# Patient Record
Sex: Female | Born: 1935 | Race: White | Hispanic: No | Smoking: Never smoker
Health system: Southern US, Community
[De-identification: ages and names within clinical notes are randomized; demographics above are authoritative.]

## PROBLEM LIST (undated history)

## (undated) DIAGNOSIS — K5900 Constipation, unspecified: Secondary | ICD-10-CM

## (undated) DIAGNOSIS — D485 Neoplasm of uncertain behavior of skin: Secondary | ICD-10-CM

## (undated) DIAGNOSIS — F3289 Other specified depressive episodes: Secondary | ICD-10-CM

## (undated) DIAGNOSIS — R269 Unspecified abnormalities of gait and mobility: Secondary | ICD-10-CM

## (undated) DIAGNOSIS — R609 Edema, unspecified: Secondary | ICD-10-CM

## (undated) DIAGNOSIS — I4892 Unspecified atrial flutter: Secondary | ICD-10-CM

## (undated) DIAGNOSIS — M25559 Pain in unspecified hip: Secondary | ICD-10-CM

## (undated) DIAGNOSIS — Z79899 Other long term (current) drug therapy: Secondary | ICD-10-CM

## (undated) DIAGNOSIS — E876 Hypokalemia: Secondary | ICD-10-CM

## (undated) DIAGNOSIS — R110 Nausea: Secondary | ICD-10-CM

## (undated) DIAGNOSIS — R7989 Other specified abnormal findings of blood chemistry: Secondary | ICD-10-CM

## (undated) DIAGNOSIS — K219 Gastro-esophageal reflux disease without esophagitis: Secondary | ICD-10-CM

## (undated) DIAGNOSIS — E785 Hyperlipidemia, unspecified: Secondary | ICD-10-CM

## (undated) DIAGNOSIS — G8929 Other chronic pain: Secondary | ICD-10-CM

## (undated) DIAGNOSIS — R350 Frequency of micturition: Secondary | ICD-10-CM

## (undated) DIAGNOSIS — R0902 Hypoxemia: Secondary | ICD-10-CM

## (undated) DIAGNOSIS — K449 Diaphragmatic hernia without obstruction or gangrene: Secondary | ICD-10-CM

## (undated) DIAGNOSIS — M5137 Other intervertebral disc degeneration, lumbosacral region: Secondary | ICD-10-CM

## (undated) DIAGNOSIS — M1710 Unilateral primary osteoarthritis, unspecified knee: Secondary | ICD-10-CM

## (undated) DIAGNOSIS — R011 Cardiac murmur, unspecified: Secondary | ICD-10-CM

## (undated) DIAGNOSIS — I517 Cardiomegaly: Secondary | ICD-10-CM

## (undated) DIAGNOSIS — E559 Vitamin D deficiency, unspecified: Secondary | ICD-10-CM

## (undated) DIAGNOSIS — R42 Dizziness and giddiness: Secondary | ICD-10-CM

## (undated) DIAGNOSIS — L600 Ingrowing nail: Secondary | ICD-10-CM

## (undated) DIAGNOSIS — R809 Proteinuria, unspecified: Secondary | ICD-10-CM

## (undated) DIAGNOSIS — M542 Cervicalgia: Secondary | ICD-10-CM

## (undated) DIAGNOSIS — M4120 Other idiopathic scoliosis, site unspecified: Secondary | ICD-10-CM

## (undated) DIAGNOSIS — H906 Mixed conductive and sensorineural hearing loss, bilateral: Secondary | ICD-10-CM

## (undated) DIAGNOSIS — L89609 Pressure ulcer of unspecified heel, unspecified stage: Secondary | ICD-10-CM

## (undated) DIAGNOSIS — M810 Age-related osteoporosis without current pathological fracture: Secondary | ICD-10-CM

## (undated) DIAGNOSIS — I447 Left bundle-branch block, unspecified: Secondary | ICD-10-CM

## (undated) DIAGNOSIS — IMO0002 Reserved for concepts with insufficient information to code with codable children: Secondary | ICD-10-CM

## (undated) DIAGNOSIS — D649 Anemia, unspecified: Secondary | ICD-10-CM

## (undated) DIAGNOSIS — R5381 Other malaise: Secondary | ICD-10-CM

## (undated) DIAGNOSIS — I509 Heart failure, unspecified: Secondary | ICD-10-CM

## (undated) DIAGNOSIS — M5430 Sciatica, unspecified side: Secondary | ICD-10-CM

## (undated) DIAGNOSIS — M51379 Other intervertebral disc degeneration, lumbosacral region without mention of lumbar back pain or lower extremity pain: Secondary | ICD-10-CM

## (undated) DIAGNOSIS — B0229 Other postherpetic nervous system involvement: Secondary | ICD-10-CM

## (undated) DIAGNOSIS — M545 Low back pain, unspecified: Secondary | ICD-10-CM

## (undated) DIAGNOSIS — I455 Other specified heart block: Secondary | ICD-10-CM

## (undated) DIAGNOSIS — J984 Other disorders of lung: Secondary | ICD-10-CM

## (undated) DIAGNOSIS — I70209 Unspecified atherosclerosis of native arteries of extremities, unspecified extremity: Secondary | ICD-10-CM

## (undated) DIAGNOSIS — R1310 Dysphagia, unspecified: Secondary | ICD-10-CM

## (undated) DIAGNOSIS — J301 Allergic rhinitis due to pollen: Secondary | ICD-10-CM

## (undated) DIAGNOSIS — L821 Other seborrheic keratosis: Secondary | ICD-10-CM

## (undated) DIAGNOSIS — R5383 Other fatigue: Secondary | ICD-10-CM

## (undated) DIAGNOSIS — H6120 Impacted cerumen, unspecified ear: Secondary | ICD-10-CM

## (undated) DIAGNOSIS — E039 Hypothyroidism, unspecified: Secondary | ICD-10-CM

## (undated) DIAGNOSIS — R531 Weakness: Secondary | ICD-10-CM

## (undated) DIAGNOSIS — G589 Mononeuropathy, unspecified: Secondary | ICD-10-CM

## (undated) DIAGNOSIS — S72009B Fracture of unspecified part of neck of unspecified femur, initial encounter for open fracture type I or II: Secondary | ICD-10-CM

## (undated) DIAGNOSIS — I10 Essential (primary) hypertension: Secondary | ICD-10-CM

## (undated) DIAGNOSIS — G4700 Insomnia, unspecified: Secondary | ICD-10-CM

## (undated) DIAGNOSIS — F329 Major depressive disorder, single episode, unspecified: Secondary | ICD-10-CM

## (undated) DIAGNOSIS — R0602 Shortness of breath: Secondary | ICD-10-CM

## (undated) DIAGNOSIS — I4891 Unspecified atrial fibrillation: Secondary | ICD-10-CM

## (undated) HISTORY — DX: Low back pain: M54.5

## (undated) HISTORY — DX: Shortness of breath: R06.02

## (undated) HISTORY — DX: Other postherpetic nervous system involvement: B02.29

## (undated) HISTORY — DX: Cardiac murmur, unspecified: R01.1

## (undated) HISTORY — DX: Diaphragmatic hernia without obstruction or gangrene: K44.9

## (undated) HISTORY — DX: Dysphagia, unspecified: R13.10

## (undated) HISTORY — DX: Other malaise: R53.81

## (undated) HISTORY — DX: Constipation, unspecified: K59.00

## (undated) HISTORY — DX: Other specified abnormal findings of blood chemistry: R79.89

## (undated) HISTORY — DX: Other specified heart block: I45.5

## (undated) HISTORY — DX: Allergic rhinitis due to pollen: J30.1

## (undated) HISTORY — DX: Unspecified atherosclerosis of native arteries of extremities, unspecified extremity: I70.209

## (undated) HISTORY — DX: Cervicalgia: M54.2

## (undated) HISTORY — DX: Anemia, unspecified: D64.9

## (undated) HISTORY — DX: Other idiopathic scoliosis, site unspecified: M41.20

## (undated) HISTORY — DX: Reserved for concepts with insufficient information to code with codable children: IMO0002

## (undated) HISTORY — DX: Hyperlipidemia, unspecified: E78.5

## (undated) HISTORY — DX: Edema, unspecified: R60.9

## (undated) HISTORY — DX: Other disorders of lung: J98.4

## (undated) HISTORY — DX: Other seborrheic keratosis: L82.1

## (undated) HISTORY — DX: Vitamin D deficiency, unspecified: E55.9

## (undated) HISTORY — DX: Mononeuropathy, unspecified: G58.9

## (undated) HISTORY — DX: Sciatica, unspecified side: M54.30

## (undated) HISTORY — DX: Hypothyroidism, unspecified: E03.9

## (undated) HISTORY — DX: Heart failure, unspecified: I50.9

## (undated) HISTORY — DX: Nausea: R11.0

## (undated) HISTORY — DX: Unspecified atrial flutter: I48.92

## (undated) HISTORY — DX: Frequency of micturition: R35.0

## (undated) HISTORY — DX: Dizziness and giddiness: R42

## (undated) HISTORY — DX: Other long term (current) drug therapy: Z79.899

## (undated) HISTORY — DX: Left bundle-branch block, unspecified: I44.7

## (undated) HISTORY — DX: Fracture of unspecified part of neck of unspecified femur, initial encounter for open fracture type I or II: S72.009B

## (undated) HISTORY — DX: Age-related osteoporosis without current pathological fracture: M81.0

## (undated) HISTORY — DX: Hypokalemia: E87.6

## (undated) HISTORY — DX: Pain in unspecified hip: M25.559

## (undated) HISTORY — DX: Insomnia, unspecified: G47.00

## (undated) HISTORY — DX: Hypoxemia: R09.02

## (undated) HISTORY — DX: Impacted cerumen, unspecified ear: H61.20

## (undated) HISTORY — PX: OTHER SURGICAL HISTORY: SHX169

## (undated) HISTORY — DX: Unilateral primary osteoarthritis, unspecified knee: M17.10

## (undated) HISTORY — DX: Other chronic pain: G89.29

## (undated) HISTORY — DX: Unspecified abnormalities of gait and mobility: R26.9

## (undated) HISTORY — DX: Other fatigue: R53.83

## (undated) HISTORY — DX: Major depressive disorder, single episode, unspecified: F32.9

## (undated) HISTORY — PX: AORTIC VALVE REPLACEMENT: SHX41

## (undated) HISTORY — DX: Other specified depressive episodes: F32.89

## (undated) HISTORY — DX: Neoplasm of uncertain behavior of skin: D48.5

## (undated) HISTORY — DX: Pressure ulcer of unspecified heel, unspecified stage: L89.609

## (undated) HISTORY — DX: Weakness: R53.1

## (undated) HISTORY — DX: Mixed conductive and sensorineural hearing loss, bilateral: H90.6

## (undated) HISTORY — DX: Proteinuria, unspecified: R80.9

## (undated) HISTORY — DX: Unspecified atrial fibrillation: I48.91

## (undated) HISTORY — DX: Cardiomegaly: I51.7

## (undated) HISTORY — DX: Gastro-esophageal reflux disease without esophagitis: K21.9

## (undated) HISTORY — DX: Low back pain, unspecified: M54.50

## (undated) HISTORY — DX: Essential (primary) hypertension: I10

## (undated) HISTORY — DX: Ingrowing nail: L60.0

---

## 1957-12-31 HISTORY — PX: DILATION AND CURETTAGE OF UTERUS: SHX78

## 1960-01-01 HISTORY — PX: PARTIAL HYSTERECTOMY: SHX80

## 1960-01-01 HISTORY — PX: APPENDECTOMY: SHX54

## 1990-08-19 HISTORY — PX: OTHER SURGICAL HISTORY: SHX169

## 1998-11-15 ENCOUNTER — Other Ambulatory Visit: Admission: RE | Admit: 1998-11-15 | Discharge: 1998-11-15 | Payer: Self-pay | Admitting: Internal Medicine

## 1999-10-01 HISTORY — PX: OTHER SURGICAL HISTORY: SHX169

## 1999-10-09 ENCOUNTER — Inpatient Hospital Stay (HOSPITAL_COMMUNITY): Admission: RE | Admit: 1999-10-09 | Discharge: 1999-10-19 | Payer: Self-pay | Admitting: Cardiology

## 1999-10-10 ENCOUNTER — Encounter: Payer: Self-pay | Admitting: Thoracic Surgery (Cardiothoracic Vascular Surgery)

## 1999-10-11 ENCOUNTER — Encounter: Payer: Self-pay | Admitting: Thoracic Surgery (Cardiothoracic Vascular Surgery)

## 1999-10-12 ENCOUNTER — Encounter: Payer: Self-pay | Admitting: Thoracic Surgery (Cardiothoracic Vascular Surgery)

## 1999-10-13 ENCOUNTER — Encounter: Payer: Self-pay | Admitting: Thoracic Surgery (Cardiothoracic Vascular Surgery)

## 1999-10-16 ENCOUNTER — Encounter: Payer: Self-pay | Admitting: Thoracic Surgery (Cardiothoracic Vascular Surgery)

## 2001-06-10 ENCOUNTER — Other Ambulatory Visit: Admission: RE | Admit: 2001-06-10 | Discharge: 2001-06-10 | Payer: Self-pay | Admitting: Internal Medicine

## 2002-09-18 ENCOUNTER — Encounter: Admission: RE | Admit: 2002-09-18 | Discharge: 2002-09-18 | Payer: Self-pay | Admitting: Specialist

## 2002-09-18 ENCOUNTER — Encounter: Payer: Self-pay | Admitting: Specialist

## 2002-09-22 ENCOUNTER — Ambulatory Visit (HOSPITAL_BASED_OUTPATIENT_CLINIC_OR_DEPARTMENT_OTHER): Admission: RE | Admit: 2002-09-22 | Discharge: 2002-09-22 | Payer: Self-pay | Admitting: Specialist

## 2004-04-05 HISTORY — PX: COLONOSCOPY: SHX174

## 2008-09-19 ENCOUNTER — Inpatient Hospital Stay (HOSPITAL_COMMUNITY): Admission: EM | Admit: 2008-09-19 | Discharge: 2008-10-04 | Payer: Self-pay | Admitting: Emergency Medicine

## 2008-10-01 HISTORY — PX: MYELOGRAM: SHX5347

## 2008-10-03 HISTORY — PX: ESOPHAGOGASTRODUODENOSCOPY: SHX5428

## 2010-12-15 ENCOUNTER — Emergency Department (HOSPITAL_BASED_OUTPATIENT_CLINIC_OR_DEPARTMENT_OTHER)
Admission: EM | Admit: 2010-12-15 | Discharge: 2010-12-16 | Payer: Self-pay | Source: Home / Self Care | Admitting: Emergency Medicine

## 2010-12-16 ENCOUNTER — Emergency Department (HOSPITAL_COMMUNITY)
Admission: RE | Admit: 2010-12-16 | Discharge: 2010-12-16 | Payer: Self-pay | Source: Home / Self Care | Attending: Emergency Medicine | Admitting: Emergency Medicine

## 2010-12-16 ENCOUNTER — Encounter (INDEPENDENT_AMBULATORY_CARE_PROVIDER_SITE_OTHER): Payer: Self-pay | Admitting: Emergency Medicine

## 2010-12-16 DIAGNOSIS — R0602 Shortness of breath: Secondary | ICD-10-CM

## 2010-12-16 HISTORY — DX: Shortness of breath: R06.02

## 2011-01-18 ENCOUNTER — Ambulatory Visit: Payer: Self-pay | Admitting: Cardiovascular Disease

## 2011-01-21 ENCOUNTER — Encounter: Payer: Self-pay | Admitting: Internal Medicine

## 2011-03-12 LAB — BASIC METABOLIC PANEL
BUN: 15 mg/dL (ref 6–23)
BUN: 21 mg/dL (ref 6–23)
CO2: 24 mEq/L (ref 19–32)
Calcium: 9.3 mg/dL (ref 8.4–10.5)
Chloride: 105 mEq/L (ref 96–112)
Chloride: 106 mEq/L (ref 96–112)
GFR calc Af Amer: >60 mL/min (ref >60)
GFR calc non Af Amer: 52 mL/min — ABNORMAL LOW (ref >60)
Potassium: 3.7 mEq/L (ref 3.5–5.1)
Potassium: 3.8 mEq/L (ref 3.5–5.1)
Sodium: 139 mEq/L (ref 135–145)
Sodium: 144 mEq/L (ref 135–145)

## 2011-03-12 LAB — CBC
HCT: 34.8 % — ABNORMAL LOW (ref 36.0–46.0)
Hemoglobin: 10.5 g/dL — ABNORMAL LOW (ref 12.0–15.0)
Hemoglobin: 11.5 g/dL — ABNORMAL LOW (ref 12.0–15.0)
MCH: 29.4 pg (ref 26.0–34.0)
MCHC: 32.4 g/dL (ref 30.0–36.0)
MCHC: 33 g/dL (ref 30.0–36.0)
MCV: 90.8 fL (ref 78.0–100.0)
MCV: 89 fL (ref 78.0–100.0)
Platelets: 161 10*3/uL (ref 150–400)
Platelets: 181 10*3/uL (ref 150–400)
RBC: 3.91 MIL/uL (ref 3.87–5.11)
RDW: 13 % (ref 11.5–15.5)
RDW: 12.7 % (ref 11.5–15.5)
WBC: 9.8 K/uL (ref 4.0–10.5)

## 2011-03-12 LAB — BASIC METABOLIC PANEL WITH GFR
Calcium: 10.1 mg/dL (ref 8.4–10.5)
Creatinine, Ser: 1 mg/dL (ref 0.4–1.2)
GFR calc Af Amer: >60 mL/min (ref >60)
Glucose, Bld: 115 mg/dL — ABNORMAL HIGH (ref 70–99)

## 2011-03-12 LAB — POCT CARDIAC MARKERS
CKMB, poc: 2.1 ng/mL (ref 1.0–8.0)
CKMB, poc: 1.5 ng/mL (ref 1.0–8.0)
Myoglobin, poc: 146 ng/mL (ref 12–200)
Myoglobin, poc: 52.5 ng/mL (ref 12–200)
Troponin i, poc: <0.05 ng/mL (ref 0.00–0.09)

## 2011-03-12 LAB — TROPONIN I: Troponin I: 0.03 ng/mL (ref 0.00–0.06)

## 2011-03-12 LAB — DIFFERENTIAL
Eosinophils Absolute: 0 10*3/uL (ref 0.0–0.7)
Lymphocytes Relative: 19 % (ref 12–46)
Neutro Abs: 6.1 10*3/uL (ref 1.7–7.7)
Neutrophils Relative %: 74 % (ref 43–77)

## 2011-03-12 LAB — PROTIME-INR
INR: 1.02 (ref 0.00–1.49)
Prothrombin Time: 13.6 seconds (ref 11.6–15.2)

## 2011-03-12 LAB — CK TOTAL AND CKMB (NOT AT ARMC): Relative Index: INVALID (ref 0.0–2.5)

## 2011-03-12 LAB — APTT: aPTT: 32 s (ref 24–37)

## 2011-03-12 LAB — D-DIMER, QUANTITATIVE: D-Dimer, Quant: 0.6 ug/mL-FEU — ABNORMAL HIGH (ref 0.00–0.48)

## 2011-05-15 NOTE — Consult Note (Signed)
NAME:  Vicki Velasquez, Vicki Velasquez NO.:  [id number]   MEDICAL RECORD NO.:  [id number]          PATIENT TYPE:  INP   LOCATION:                               FACILITY:  [facility]   PHYSICIAN:  Graylin Shiver, M.D.   DATE OF BIRTH:  Sep 08, 1916   DATE OF CONSULTATION:  09/24/2008  DATE OF DISCHARGE:                                 CONSULTATION   We were asked to see Vicki Velasquez in consultation today by Dr. Hillery Aldo of [facility].   HISTORY OF PRESENT ILLNESS:  This is an 77 year old female with a  history of hypertension, diabetes mellitus, and GERD, who was admitted  on September 19, 2008, with right lower lobe pneumonia.  She was also  found to have gallstones.  She has a history of hypertension and chronic  narcotic use for sciatica.  Since she has been admitted, she has  developed shingles as well as acute neck pain and MRIs pending for her  neck pain.  The patient has had some progressive dysphagia to pills and  solids.  Dr. Chilton Si asked if [facility] would work up for dysphagia during  this admission.  Barium swallow done today was positive for stricture as  well as gastric mucosal thickening in the fundus and body.   Past medical history is provided by her son and was significant for  chronic sciatica particularly affecting her left lower extremity,  hypertension, osteoporosis, hypothyroidism.  She had a history of a  benign brain tumor that was removed.  She also has a history of porcine  aortic valve replacement.  She is not on Coumadin for this.   She has had a hysterectomy, appendectomy, and a coronary artery bypass  graft.   Her PCP is Dr. Murray Hodgkins.   Current medications are per chart, but include an 81 mg aspirin and  Prilosec.   She has an allergy to CODEINE.   Review of systems as per HPI.   Social history is negative for alcohol, tobacco, and IV drug use.   Family history is noncontributory.   She did tell me that she had a colonoscopy done  approximately 5 years  ago by Dr. Sharrell Ku and she was told she would not need another  one.   On physical exam, she is awake.  She has an ice pack applied to her  neck.  She has hearing loss.  Temperature is 98.0, pulse 79,  respirations 18, blood pressure is 164/70.  Her heart has a regular rate  and rhythm.  Her abdomen is soft, nontender, and nondistended with good  bowel sounds.  She appears debilitated and feeble.   LABORATORY DATA:  Her BMET is within normal limits.  Her CBC is  significant for a hemoglobin of 9.8 and a hematocrit of 29.2.  LFTs are  within normal limits.  They were taken on September 19, 2008.   Radiological exams include a barium swallow that was done today.  She  has a 13-mm tablet that did not pass through the GE junction.  She had a  patulous esophagus  with possible stricture of the GE junction.   She had diverticulum in the region of the GE junction.   She had persistent mucosal thickening along the gastric fundus and body.   ASSESSMENT:  Dr. Herbert Moors has seen and examined the patient, reviewed  the chart, and collected a history.  His impression is this is an 68-  year-old female with multiple medical problems including a  gastroesophageal stricture and gastric mucosal thickening, needs an  esophagogastroduodenoscopy with this admission with possible dilatation  and with biopsy; however, given her acute neck problems, pneumonia, and  shingles, this can wait a few days until her health improves.  We will  check her back early next week.  Prior to procedure, the patient will  need to receive prophylactic antibiotics for her porcine valve and her  Lovenox will need to be stopped.   Thank you very much for this consultation.      Stephani Police, PA    ______________________________  Graylin Shiver, M.D.    MLY/MEDQ  D:  09/24/2008  T:  09/25/2008  Job:  [id number]   cc:   Graylin Shiver, M.D.  Lenon Curt Chilton Si, M.D.

## 2011-05-15 NOTE — Discharge Summary (Signed)
NAME:  Vicki Velasquez, Vicki Velasquez NO.:  [id number]   MEDICAL RECORD NO.:  [id number]          PATIENT TYPE:  INP   LOCATION:  5035                         FACILITY:  [facility]   PHYSICIAN:  Lonia Blood, M.D.DATE OF BIRTH:  Mar 09, 1916   DATE OF ADMISSION:  09/19/2008  DATE OF DISCHARGE:  10/04/2008                               DISCHARGE SUMMARY   PRIMARY CARE PHYSICIAN:  [name], M.D.   Please note this is an addendum to a previously dictated interim summary  per Dr. Hillery Aldo on September 24, 2008, labeled job number [id number].  For full details concerning hospital stay, please refer to that thorough  dictated progress note.   DISCHARGE DIAGNOSES:  1. Transient dysphagia secondary to esophageal stricture.      a.     Initially identified during early hospital stay with       esophagram suggesting esophageal stricture and thickened gastric       mucosa.      b.     EGD delayed secondary to concern for cervical spine       degenerative disease.      c.     Cleared for EGD via myelogram.      d.     EGD carried out successfully with evidence of no esophageal       stricture or mucosal abnormalities whatsoever.      e.     Tolerating regular diet.  2. Right middle lobe pneumonia - completed antibiotic therapy.  3. Hypertension.  4. Hypothyroidism.  5. Osteoporosis.  6. Herpes zoster outbreak status post Zovirax and Lyrica therapy.  7. Porcine aortic valve replacement.  8. Chronic right hip pain.  9. 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  10.Left renal cyst.  11.Cholelithiasis.  12.Chronic thrombocytopenia.  13.Normocytic anemia.   DISCHARGE MEDICATIONS:  1. Oxycodone 5 mg q.6h. as needed.  2. Promethazine 12.5 mg q.6h. p.r.n.  3. Lasix 40 mg daily p.r.n. edema.  4. Toprol XL 50 mg daily.  5. Hyzaar 100/25 p.o. daily.  6. Levoxyl 75 mg p.o. daily.  7. Aspirin 81 mg p.o.  daily.  8. Centrum one tablet p.o. daily.  9. Prilosec OTC 20 mg p.o. daily.  10.Evista one tablet p.o. daily.  11.Senokot 1 tablet p.o. q.h.s.   FOLLOW UP:  The patient is advised to follow up with Dr. Frederik Pear  within approximately 7 days for routine medical recheck.   ADDITIONAL PROCEDURES:  1. Cervical myelogram revealing evidence of multilevel degenerative      joint disease but no evidence whatsoever of significant cord      compression.  2. EGD per Dr. Leary Roca.  Questionable tiny hiatal hernia with some      C loop narrowing of the duodenum but otherwise normal EGD without      any stomach abnormalities and no indication for dilation.   ADDENDUM TO HOSPITAL COURSE:  Vicki Velasquez hospital stay was extended  beyond the previously dictated summary due to concern for the  possibility of an esophageal stricture leading to dysphagia.  The  patient was not immediately cleared for EGD because of a concern for  severe degenerative spine disease of the cervical spine and the fear of  the possibility of cord compression with manipulation of the neck for  this procedure.  As a result, a CT myelogram was carried out by  radiology on October 01, 2008.  This revealed no evidence whatsoever of  spinal cord compression.  The patient was therefore cleared for EGD.  EGD was successfully carried out on October 03, 2008 and revealed no  evidence of esophageal stricture and no abnormalities of the gastric  mucosa.  The patient was returned to her room and was able to tolerate a  regular diet from that point on.  Physical therapy and occupational  therapy were asked to again assess the patient.  They felt the patient  would benefit from home health occupational therapy, as well as 24-hour  supervision/assistance.  The patient was able to confirm that her family  was able to provide this.  Home health occupational therapy orders were  placed, and the patient was then cleared for discharge with  stable vital  signs in an afebrile state.      Lonia Blood, M.D.  Electronically Signed     JTM/MEDQ  D:  10/04/2008  T:  10/04/2008  Job:  [id number]

## 2011-05-15 NOTE — H&P (Signed)
NAME:  Vicki Velasquez, Vicki Velasquez              ACCOUNT NO.:  [id number]   MEDICAL RECORD NO.:  [id number]          PATIENT TYPE:  INP   LOCATION:  5035                         FACILITY:  [facility]   PHYSICIAN:  Lonia Blood, M.D.      DATE OF BIRTH:  12/01/1916   DATE OF ADMISSION:  09/19/2008  DATE OF DISCHARGE:                              HISTORY & PHYSICAL   PRIMARY CARE PHYSICIAN:  [name], MD   CARDIOLOGIST:  Antionette Char, MD   PRESENTING COMPLAINT:  Right upper quadrant pain.   HISTORY OF PRESENT ILLNESS:  The patient is an 77 year old female with  known hypertension, diabetes and GERD who presented with right upper  quadrant abdominal pain that is sharp, intermittent, lasting a few  seconds to a few minutes.  Pain has been going on in the past 24 hours.  No relieved by anything and no aggravating factors except deep breath.  The patient took some pain meds at home that is not helping her pain so  she came to the emergency room.  Denied any fever or chills.  Denied any  cough.  Denied any nausea and vomiting.  She denied any diarrhea.   PAST MEDICAL HISTORY:  Significant for  1. Chronic pain with some sciatica especially affecting the left lower      extremity.  2. GERD.  3. Hypertension.  4. Hypothyroidism.  5. History of brain tumor status post valve replacement.  6. Osteoporosis.   ALLERGIES:  She is allergic to CODEINE and BIAXIN.   MEDICATIONS:  1. OxyContin 5 mg tablet now switched to hydrocodone and      acetaminophen.  2. Phenergan 25 mg every 6 hours p.r.n. for nausea.  3. Lasix 40 mg daily.  4. Toprol-XL 50 mg daily.  5. Hyzaar 125 1 tablet daily.  6. Levoxyl 75 mcg daily.  7. Aspirin 81 mg daily.  8. Centrum 1 tablet daily.  9. Prilosec 20 mg.  10.Evista 60 mg daily.   SOCIAL HISTORY:  The patient lives in [place], [place].  She lives  alone.  No alcohol, tobacco or IV drug use.  She is widowed.   FAMILY HISTORY:  Noncontributory.   REVIEW OF  SYSTEMS:  Mainly pain in her lower extremity from her  sciatica.   PHYSICAL EXAMINATION:  VITAL SIGNS:  Temperature 97.3, blood pressure  200/70, pulse 63, respiratory rate 18, sats 99% on room air.  GENERAL:  She is awake, alert, oriented in no acute distress.  HEENT:  PERRL.  EOMI.  NECK:  Supple.  No JVD, no lymphadenopathy.  RESPIRATORY:  The patient has good air entry bilaterally.  No wheezes or  rales.  CARDIOVASCULAR:  She has S1-S2, no murmur.  ABDOMEN:  Soft, nontender with positive bowel sounds.  EXTREMITIES:  No edema, cyanosis or clubbing.   LABORATORY DATA:  White count is 6.7, hemoglobin 11.6, platelet count  164.  Lipase 21.  Sodium 137, potassium 3.3, chloride 101, CO2 24,  glucose 108, BUN 16, creatinine 0.93, calcium 9.4, total protein 6.3,  albumin 3.7.  LFTs are normal.  Urinalysis shows  small leukocyte  esterase, WBC is 0-12, few bacteria.  Chest x-ray showed medial and  right lung base airspace disease with air bronchograms most consistent  with infection, atypical pulmonary edema could also present the same.  Abdominal ultrasound showed left renal cyst, some cholelithiasis without  evidence of cholecystitis.   ASSESSMENT:  This is an 77 year old female presenting with right upper  quadrant pain and with a right medial lower lung infiltrate, probably  early pneumonia.  Her pain is likely pleuritic in nature due to the  position of her infiltrate that is being reflected in her right upper  quadrant.  She has some gallstones but there is no evidence of  cholecystitis.   PLAN:  1. Early right lower lobe pneumonia.  No evidence of aspiration.  We      will treat her as if this is community-acquired pneumonia and start      her on Rocephin and Zithromax IV.  She is not febrile and has no      white count, so she should probably be okay on this regimen.      Continue with some pain control.  On oxygen.  2. Hypothyroidism.  I will check TSH and continue with her       Levothyroxine.  3. Hypertension.  Her blood pressure is very much elevated.  We will      start with her home medications and titrate them as necessary.  4.      Sciatica.  Again, the patient is on chronic narcotics.  We will be      very careful with it and treat her accordingly.  4. Cholelithiasis.  The patient has chronic nausea and has been taking      Phenergan.  This may be related to cholelithiasis.  She may require      some elective cholecystectomy at some point.  5. Hypokalemia.  We will replete her potassium as soon as possible.      Lonia Blood, M.D.  Electronically Signed     LG/MEDQ  D:  09/19/2008  T:  09/20/2008  Job:  [id number]

## 2011-05-15 NOTE — Op Note (Signed)
NAME:  Vicki Velasquez, Vicki Velasquez              ACCOUNT NO.:  [id number]   MEDICAL RECORD NO.:  [id number]          PATIENT TYPE:  INP   LOCATION:  5035                         FACILITY:  [facility]   PHYSICIAN:  Petra Kuba, M.D.    DATE OF BIRTH:  [birth year]   DATE OF PROCEDURE:  10/03/2008  DATE OF DISCHARGE:                               OPERATIVE REPORT   PROCEDURE:  Esophagogastroduodenoscopy.   INDICATION:  The patient with some dysphagia, which actually has  seemingly resolved.  Abnormal barium swallow in upper GI, worrisome for  lesion in the stomach as well as in the esophagus.  Consent was signed  after risks, benefits, methods, and options thoroughly discussed  multiple times in the past and this week on followup.   MEDICATIONS:  Use fentanyl 40 mcg, Versed 3.5 mg.   PROCEDURE IN DETAIL:  The video endoscope was inserted by direct vision.  The proximal and mid esophagus was normal and distal esophagus with a  probable tiny hiatal hernia with a widely patent thin fibrous ring.  The  scope passed easily through it.  The scope passed into the stomach,  advanced through normal antrum, normal pylorus, and through a normal  duodenal bulb.  She did have some narrowing of her C-loop, but we were  able to advance around the C-loop into a normal second portion of the  duodenum, a normal-appearing ampulla was seen.  The scope was slowly  withdrawn back to the bulb.  No abnormalities were seen.  The scope was  withdrawn back to the stomach and retroflexed.  Cardia, fundus,  angularis, lesser and greater curve were evaluated on retroflex and then  straight visualization.  No abnormalities were seen.  There was nothing  to biopsy and nothing worrisome about her stomach.  No resections.  The  scope was slowly withdrawn.  Again, a good look at the distal esophagus  confirmed the above findings.  We looked at the GE junction for a while  and based on her age and other medical problems, lack of  significant  findings, elected not to balloon dilate at this time.  The scope was  withdrawn.  The esophagus was normal.  A quick look at the vocal cords  were normal, scope was withdrawn.  The patient tolerated the procedure  well.  There was no obvious immediate complication.   ENDOSCOPIC DIAGNOSES:  1. Questionable tiny hiatal hernia with a questionable, widely patent      thin fibrous ring.  2. Some C-loop narrowing of duodenum.  3. Otherwise normal esophagogastroduodenoscopy without any stomach      abnormalities seen.  Held dilation due to eating well this week and      no significant narrowing seen.   PLAN:  I have to see back p.r.n. and dilate if esophageal dysphagia  returns.  No further GI workup at this time.  I would be happy to see  back p.r.n.  Please call if I can be of any further assistance.           ______________________________  Petra Kuba, M.D.    MEM/MEDQ  D:  10/03/2008  T:  10/03/2008  Job:  [id number]   cc:   [name], M.D.

## 2011-05-15 NOTE — Group Therapy Note (Signed)
NAME:  Vicki Velasquez, Vicki Velasquez              ACCOUNT NO.:  [id number]   MEDICAL RECORD NO.:  [id number]          PATIENT TYPE:  INP   LOCATION:  5035                         FACILITY:  [facility]   PHYSICIAN:  Hillery Aldo, M.D.   DATE OF BIRTH:  Apr 11, 1916                                 PROGRESS NOTE   DATE OF DISCHARGE:  Pending.   PRIMARY CARE PHYSICIAN:  [name], M.D.   CARDIOLOGIST:  Antionette Char, MD.   GASTROENTEROLOGIST:  Griffith Citron, M.D.   DISCHARGE DIAGNOSES:  1. Right middle lobe pneumonia.  2. Dysphagia secondary to esophageal stricture.  3. Abnormal mucosal thickening in the stomach, workup ongoing.  4. Hypertension.  5. Hypothyroidism.  6. Osteoporosis.  7. Herpes zoster outbreak.  8. History of aortic valve replacement.  9. Right hip pain.  10.Cervicalgia, workup ongoing.  11.Left renal cyst.  12.Cholelithiasis.  13.Thrombocytopenia.  14.Normocytic anemia.   DISCHARGE MEDICATIONS:  Dictated at time of discharge.   CONSULTATIONS:  Graylin Shiver, M.D. of gastroenterology.   HISTORY OF PRESENT ILLNESS:  The patient is an 77 year old female who presented to the hospital with  a chief complaint of right upper quadrant pain.  Upon initial evaluation  in the emergency department, she was found to have evidence of a right  middle lobe pneumonia and therefore was admitted for further evaluation  and workup.  For full the details, please see the dictated report done  by Dr. [name].   PROCEDURES/DIAGNOSTICS STUDIES:  1. Chest x-ray on September 19, 2008 showed medial right lung base      airspace disease with air bronchograms consistent with infection.      Atypical pulmonary edema was also in the differential  2. Abdominal ultrasound on September 19, 2008 showed a left renal cyst      and cholelithiasis without evidence of cholecystitis.  3. Right hip films on September 20, 2008 showed no acute bony      abnormality.  Degenerative disk disease of the  lower lumbar spine.      Possible myositis ossificans in the soft tissue adjacent to the      greater trochanter on the left.  4. Esophagram on September 24, 2008 showed a patulous esophagus with a      potential stricture at the gastroesophageal junction.  A 13 mm      barium tablet failed to clear the GE junction.  This may be in part      due to the position.  However, consideration of a stricture from      reflux esophagitis could not be excluded.  There was diverticulum      in the region of the GE junction.  There is persistent mucosal      thickening along the gastric fundus and body.  Differential      includes gastritis, lymphoma and gastric carcinoma.      Recommendations were to perform an upper endoscopy for further      evaluation.   LABORATORY DATA:  Discharge laboratory values will be dictated at the time of discharge.   HOSPITAL COURSE:  1.  Right middle lobe pneumonia:  The patient was admitted and put on      empiric Rocephin and azithromycin.  Sputum cultures were attempted.      However, the sample sent was not representative of lower airway      secretions.  Blood cultures were negative.  She is improving      clinically on empiric treatment and has completed approximately 5      days of therapy.  We will treat her for a full 7 days with IV      antibiotics.  She has been afebrile with a normal white blood cell      count.  2. Dysphagia:  When the patient had significant difficulty swallowing      pills prompting evaluation with an esophagram.  Esophagram did show      abnormalities and this prompted a GI consultation with Dr. [name].      At this time, consultation is pending.  3. Hypertension:  The patient's blood pressure is reasonably well      controlled on her outpatient regimen.  Initially, her Lasix was      held and as her blood pressure became suboptimally controlled, this      was resumed.  She was intermittently hypotensive early in her      hospital  stay.  4. Hypothyroidism:  The patient's TSH, was checked and found to be      within normal limits indicating she is appropriately replaced.  5. Osteoporosis, degenerative disk disease, severe cervicalgia:  The      patient began to complain of severe neck pain 4 days into her      hospital stay.  Skin on her neck was erythematous where she had      been using hot packs, but no evidence of a blistering eruption was      noted.  She has known disk disease in the lower spine.  MRI scans      are pending at time of this dictation, but she is at high risk for      compression fractures, degenerative disk disease with spinal      stenosis and other processes.  Therefore, an MRI was felt to be the      best diagnostic study to rule out these various potential      etiologies.  6. Herpes zoster:  The patient has an active herpes zoster outbreak on      her right upper thigh and lateral buttocks.  She was put on Valtrex      and will complete a 7 day course of treatment with Valtrex.      Because of the lancinating pain in her neck and the inability to      rule out a zoster outbreak in that area, she was also put on Lyrica      when IV Dilaudid and p.o. Valium and Flexeril failed to      significantly relieve her symptoms.  The dermatomal distribution on      the leg follows L2 and it would be unusual for her to have a second      outbreak at a different dermatome.  Nevertheless, we cannot      completely rule it out and so Lyrica has been started.  7. Status post aortic valve replacement:  This is apparently a porcine      valve and she does not require therapeutic anticoagulation.  8. Right hip pain:  The patient  did get plain films done which show      some degenerative changes.  She has been treated conservatively.  9. Thrombocytopenia:  The patient has a mild thrombocytopenia along      with a normocytic anemia which is likely due to her advanced age      and bone marrow suppression.    DISPOSITION:  The patient's disposition is not yet set as she still has some active  issues to evaluate.  When she is stable for discharge, discharge summary  addendum will be dictated.      Hillery Aldo, M.D.  Electronically Signed     CR/MEDQ  D:  09/24/2008  T:  09/25/2008  Job:  [id number]   cc:   [name], M.D.  Fax: [phone]   Antionette Char, MD  Fax: [phone]   Griffith Citron, M.D.  Fax: [phone]

## 2011-05-18 NOTE — Op Note (Signed)
   NAME:  [name], Vicki Velasquez                        ACCOUNT NO.:  [id number]   MEDICAL RECORD NO.:  [id number]                   PATIENT TYPE:  AMB   LOCATION:  DSC                                  FACILITY:  [facility]   PHYSICIAN:  Philips J. Montez Morita, M.D.             DATE OF BIRTH:  12-04-1916   DATE OF PROCEDURE:  09/22/2002  DATE OF DISCHARGE:                                 OPERATIVE REPORT   PREOPERATIVE DIAGNOSES:  Torn medial meniscus.   POSTOPERATIVE DIAGNOSES:  Torn medial meniscus.   OPERATION PERFORMED:  Arthroscopic partial medial meniscectomy.   SURGEON:  Philips J. Montez Morita, M.D.   ANESTHESIA:  General.   DESCRIPTION OF PROCEDURE:  After suitable general anesthesia, the leg was  exsanguinated and a left thigh tourniquet was inflated to 350 mmHg.  We then  placed him in the arthroscopy leg holder __________. Lateral parapatellar in-  flow portal was created and then an inferolateral port for the scope,  anteromedial portal under direct visualization.  Pertinent pathology is  confined to the medial joint in the area of the medial tear of the medial  meniscus down to the posterior one-third.  This was trimmed with a  combination of small straight punches and __________meniscus.  Lateral joint  looked quite good.  Patellofemoral revealed some degenerative change.  There  are some grade osteoarthritic changes on the medial femoral condyle.  Scope  was changed to the medial side   DICTATION ENDED HERE                                                 Philips J. Montez Morita, M.D.    PJC/MEDQ  D:  09/22/2002  T:  09/22/2002  Job:  [id number]

## 2011-09-22 ENCOUNTER — Encounter: Payer: Self-pay | Admitting: Cardiovascular Disease

## 2011-10-01 LAB — URINALYSIS, ROUTINE W REFLEX MICROSCOPIC
Bilirubin Urine: NEGATIVE
Glucose, UA: NEGATIVE
Glucose, UA: NEGATIVE
Hgb urine dipstick: NEGATIVE
Hgb urine dipstick: NEGATIVE
Ketones, ur: NEGATIVE
Nitrite: NEGATIVE
Protein, ur: NEGATIVE
Specific Gravity, Urine: 1.009
Specific Gravity, Urine: 1.023
Urobilinogen, UA: 0.2
Urobilinogen, UA: 0.2
pH: 6

## 2011-10-01 LAB — BASIC METABOLIC PANEL
BUN: 16
BUN: 6
BUN: 18
BUN: 23
CO2: 23
CO2: 25
CO2: 29
CO2: 26
CO2: 27
Calcium: 8.7
Calcium: 8.7
Calcium: 9
Calcium: 9.1
Calcium: 9.3
Calcium: 9.3
Calcium: 9.6
Chloride: 101
Chloride: 103
Chloride: 104
Chloride: 103
Creatinine, Ser: 0.77
Creatinine, Ser: 0.8
Creatinine, Ser: 0.87
Creatinine, Ser: 0.89
Creatinine, Ser: 0.92
Creatinine, Ser: 0.92
Creatinine, Ser: 0.91
Creatinine, Ser: 1.05
GFR calc Af Amer: >60
GFR calc Af Amer: >60
GFR calc Af Amer: >60
GFR calc Af Amer: >60
GFR calc Af Amer: >60
GFR calc Af Amer: 57 — ABNORMAL LOW
GFR calc non Af Amer: >60
GFR calc non Af Amer: >60
GFR calc non Af Amer: 49 — ABNORMAL LOW
GFR calc non Af Amer: 58 — ABNORMAL LOW
Glucose, Bld: 92
Glucose, Bld: 80
Glucose, Bld: 85
Glucose, Bld: 90
Potassium: 3.5
Potassium: 3.6
Potassium: 3.9
Sodium: 132 — ABNORMAL LOW
Sodium: 139
Sodium: 139

## 2011-10-01 LAB — DIFFERENTIAL
Basophils Absolute: 0.1
Basophils Relative: 2 — ABNORMAL HIGH
Eosinophils Absolute: 0.2
Eosinophils Relative: 3
Lymphocytes Relative: 26
Lymphs Abs: 1.8
Monocytes Absolute: 0.7
Monocytes Relative: 11
Neutro Abs: 3.9
Neutrophils Relative %: 58

## 2011-10-01 LAB — COMPREHENSIVE METABOLIC PANEL WITH GFR
Albumin: 3.7
Alkaline Phosphatase: 91
BUN: 16
Chloride: 101
Creatinine, Ser: 0.93
Glucose, Bld: 108 — ABNORMAL HIGH
Potassium: 3.3 — ABNORMAL LOW
Total Bilirubin: 0.6

## 2011-10-01 LAB — URINE MICROSCOPIC-ADD ON

## 2011-10-01 LAB — CK TOTAL AND CKMB (NOT AT ARMC)
CK, MB: 2
Relative Index: INVALID
Total CK: 46
Total CK: 60

## 2011-10-01 LAB — CBC
HCT: 29.2 — ABNORMAL LOW
HCT: 29.3 — ABNORMAL LOW
HCT: 30.5 — ABNORMAL LOW
HCT: 34.1 — ABNORMAL LOW
HCT: 28.8 — ABNORMAL LOW
HCT: 32.6 — ABNORMAL LOW
Hemoglobin: 11.6 — ABNORMAL LOW
Hemoglobin: 9.3 — ABNORMAL LOW
Hemoglobin: 9.7 — ABNORMAL LOW
Hemoglobin: 10.9 — ABNORMAL LOW
MCHC: 33.7
MCHC: 33.8
MCHC: 34
MCHC: 35.2
MCHC: 33.6
MCV: 89.2
MCV: 89.4
MCV: 89.7
MCV: 90.2
Platelets: 122 — ABNORMAL LOW
Platelets: 164
Platelets: 174
Platelets: 276
RBC: 3.01 — ABNORMAL LOW
RBC: 3.1 — ABNORMAL LOW
RBC: 3.21 — ABNORMAL LOW
RBC: 3.27 — ABNORMAL LOW
RBC: 3.27 — ABNORMAL LOW
RBC: 3.82 — ABNORMAL LOW
RBC: 3.61 — ABNORMAL LOW
RDW: 12.6
RDW: 12.8
RDW: 12.9
RDW: 13.1
RDW: 13.2
WBC: 6.1
WBC: 6.4
WBC: 6.7
WBC: 7.1
WBC: 8.2
WBC: 5.9

## 2011-10-01 LAB — WOUND CULTURE: Culture: NO GROWTH

## 2011-10-01 LAB — POCT CARDIAC MARKERS
Myoglobin, poc: 112
Troponin i, poc: <0.05

## 2011-10-01 LAB — COMPREHENSIVE METABOLIC PANEL
ALT: 12
AST: 23
CO2: 24
Calcium: 9.4
GFR calc Af Amer: >60
GFR calc non Af Amer: 57 — ABNORMAL LOW
Sodium: 137
Total Protein: 6.3

## 2011-10-01 LAB — CULTURE, BLOOD (ROUTINE X 2)

## 2011-10-01 LAB — LIPASE, BLOOD: Lipase: 21

## 2011-10-01 LAB — LIPID PANEL
Cholesterol: 159
HDL: 40

## 2011-10-01 LAB — TROPONIN I: Troponin I: 0.01

## 2011-10-01 LAB — EXPECTORATED SPUTUM ASSESSMENT W GRAM STAIN, RFLX TO RESP C

## 2011-10-01 LAB — TSH: TSH: 1.841

## 2011-10-02 ENCOUNTER — Ambulatory Visit
Admission: RE | Admit: 2011-10-02 | Discharge: 2011-10-02 | Disposition: A | Discharge status: Home / Self Care | Payer: Medicare Other | Source: Ambulatory Visit | Attending: Cardiovascular Disease | Admitting: Cardiovascular Disease

## 2011-10-02 ENCOUNTER — Ambulatory Visit (INDEPENDENT_AMBULATORY_CARE_PROVIDER_SITE_OTHER): Payer: Medicare Other | Admitting: Cardiovascular Disease

## 2011-10-02 ENCOUNTER — Encounter: Payer: Self-pay | Admitting: Cardiovascular Disease

## 2011-10-02 ENCOUNTER — Telehealth: Payer: Self-pay | Admitting: *Deleted

## 2011-10-02 DIAGNOSIS — Z954 Presence of other heart-valve replacement: Secondary | ICD-10-CM

## 2011-10-02 DIAGNOSIS — Z952 Presence of prosthetic heart valve: Secondary | ICD-10-CM

## 2011-10-02 DIAGNOSIS — I4891 Unspecified atrial fibrillation: Secondary | ICD-10-CM

## 2011-10-02 DIAGNOSIS — I10 Essential (primary) hypertension: Secondary | ICD-10-CM | POA: Insufficient documentation

## 2011-10-02 DIAGNOSIS — R0989 Other specified symptoms and signs involving the circulatory and respiratory systems: Secondary | ICD-10-CM

## 2011-10-02 DIAGNOSIS — R0600 Dyspnea, unspecified: Secondary | ICD-10-CM | POA: Insufficient documentation

## 2011-10-02 LAB — BASIC METABOLIC PANEL
BUN: 19 mg/dL (ref 6–23)
GFR: 68.21 mL/min (ref >60.00)
Potassium: 4.1 mEq/L (ref 3.5–5.1)

## 2011-10-02 LAB — BRAIN NATRIURETIC PEPTIDE: Pro B Natriuretic peptide (BNP): 416 pg/mL — ABNORMAL HIGH (ref 0.0–100.0)

## 2011-10-02 MED ORDER — FUROSEMIDE 20 MG PO TABS: 20.0000 mg | ORAL_TABLET | Freq: Every day | ORAL | Status: DC

## 2011-10-02 MED ORDER — POTASSIUM CHLORIDE 10 MEQ PO CPCR: 10.0000 meq | ORAL_CAPSULE | Freq: Every day | ORAL | Status: DC

## 2011-10-02 NOTE — Patient Instructions (Signed)
Your physician wants you to follow-up in 3 months. You will receive a reminder letter in the mail two months in advance. If you don't receive a letter, please call our office to schedule the follow-up appointment.   Your physician recommends that you have lab work today bnp bmp.  A chest x-ray takes a picture of the organs and structures inside the chest, including the heart, lungs, and blood vessels. This test can show several things, including, whether the heart is enlarges; whether fluid is building up in the lungs; and whether pacemaker / defibrillator leads are still in place. [facility] you may go now for chest xray.

## 2011-10-02 NOTE — Assessment & Plan Note (Signed)
She remains stable from an atrial fibrillation standpoint. She is asymptomatic.

## 2011-10-02 NOTE — Telephone Encounter (Signed)
Patient called with lab results. One month Labs app made, meds called in,Pt verbalized understanding. Alfonso Ramus RN

## 2011-10-02 NOTE — Assessment & Plan Note (Addendum)
She has rales in her right base. She does not have any fever. She does not appear to be in congestive heart failure. I wonder whether she has some chronic aspiration. We'll send her for a chest x-ray. She'll need to followup with Dr. Chilton Si.  I do not think that she needs any extra diuretic at this point.

## 2011-10-02 NOTE — Telephone Encounter (Signed)
Message copied by Antony Odea on Tue Oct 02, 2011  6:24 PM ------      Message from: Vesta Mixer      Created: Tue Oct 02, 2011  6:10 PM       Overall OK.  BNP is elevated.  Lets add Lasix 20 mg a day with KCl 10 meq QD.  Recheck BMP and BNP in 1 month.

## 2011-10-02 NOTE — Progress Notes (Signed)
Huey Bienenstock Date of Birth  Aug 04, 1916 [facility] [address]    [address] [place], [place]  [address] [phone]  Fax  [phone]  History of Present Illness:  77 year old female with a history of aortic stenosis and aortic valve replacement. She also has asymptomatic atrial fibrillation.  She has occasional episodes of some shortness of breath.  She denies any episodes of syncope or presyncope.  Current Outpatient Prescriptions on File Prior to Visit  Medication Sig Dispense Refill  . aspirin 325 MG tablet Take 325 mg by mouth daily.        Marland Kitchen levothyroxine (SYNTHROID, LEVOTHROID) 75 MCG tablet Take 75 mcg by mouth daily.        Marland Kitchen losartan-hydrochlorothiazide (HYZAAR) 100-25 MG per tablet Take 1 tablet by mouth daily.        . Multiple Vitamin (MULTI-VITAMIN PO) Take by mouth.        Marland Kitchen omeprazole (PRILOSEC) 20 MG capsule Take 20 mg by mouth daily.        . OXYCODONE HCL PO Take 5 mg by mouth as needed.         Allergies  Allergen Reactions  . Biaxin   . Codeine   . Demerol   . Prednisone     Past Medical History  Diagnosis Date  . Atrial flutter   . Hypothyroidism   . Hypertension   . Edema     Leg  . Atrial fibrillation   . Dyspnea   . SOB (shortness of breath) 12/16/10    At rest  . Chronic lung disease     Stable  . Cardiomegaly     Stable    Past Surgical History  Procedure Date  . Aortic valve replacement     History  Smoking status  . Never Smoker   Smokeless tobacco  . Not on file    History  Alcohol Use No    No family history on file.  Reviw of Systems:  Reviewed in the HPI.  All other systems are negative.  Physical Exam: BP 162/70  Pulse 56  Ht 5\' 4"  (1.626 m)  Wt 126 lb (57.153 kg)  BMI 21.63 kg/m2 The patient is alert and oriented x 3.  The mood and affect are normal.   Skin: warm and dry.  Color is normal.    HEENT:   the sclera are nonicteric.  The mucous membranes are moist.  The carotids are 2+ without  bruits.  There is no thyromegaly.  There is no JVD.    Lungs: She has rales in the right base..  The chest wall is non tender.    Heart: Irregular rate with a normal S1 and S2.  There is a soft systolic murmur. The PMI is not displaced.     Abdomen: good bowel sounds.  There is no guarding or rebound.  There is no hepatosplenomegaly or tenderness.  There are no masses.   Extremities:  no clubbing, cyanosis, or edema.  The legs are without rashes.  The distal pulses are intact.   Neuro:  Cranial nerves II - XII are intact.  Motor and sensory functions are intact.    The gait is normal.   Assessment / Plan:

## 2011-10-02 NOTE — Assessment & Plan Note (Signed)
She still admits to eating little bit of extra salt. We will have her decrease her salt intake and we will continue with her same medications. Hopefully this will lower her blood pressure adequately.

## 2011-11-01 ENCOUNTER — Ambulatory Visit (INDEPENDENT_AMBULATORY_CARE_PROVIDER_SITE_OTHER): Payer: Medicare Other | Admitting: *Deleted

## 2011-11-01 DIAGNOSIS — I4891 Unspecified atrial fibrillation: Secondary | ICD-10-CM

## 2011-11-01 DIAGNOSIS — R0609 Other forms of dyspnea: Secondary | ICD-10-CM

## 2011-11-01 DIAGNOSIS — Z954 Presence of other heart-valve replacement: Secondary | ICD-10-CM

## 2011-11-01 DIAGNOSIS — I10 Essential (primary) hypertension: Secondary | ICD-10-CM

## 2011-11-01 DIAGNOSIS — Z952 Presence of prosthetic heart valve: Secondary | ICD-10-CM

## 2011-11-01 LAB — BASIC METABOLIC PANEL
CO2: 27 mEq/L (ref 19–32)
Calcium: 9.7 mg/dL (ref 8.4–10.5)
GFR: 46.81 mL/min — ABNORMAL LOW (ref >60.00)
Potassium: 3.7 mEq/L (ref 3.5–5.1)
Sodium: 138 mEq/L (ref 135–145)

## 2011-12-18 ENCOUNTER — Ambulatory Visit (INDEPENDENT_AMBULATORY_CARE_PROVIDER_SITE_OTHER): Payer: Medicare Other | Admitting: Cardiovascular Disease

## 2011-12-18 ENCOUNTER — Encounter: Payer: Self-pay | Admitting: Cardiovascular Disease

## 2011-12-18 VITALS — BP 148/78 | HR 55 | Ht 64.0 in | Wt 126.8 lb

## 2011-12-18 DIAGNOSIS — I4891 Unspecified atrial fibrillation: Secondary | ICD-10-CM

## 2011-12-18 DIAGNOSIS — Z952 Presence of prosthetic heart valve: Secondary | ICD-10-CM

## 2011-12-18 DIAGNOSIS — I359 Nonrheumatic aortic valve disorder, unspecified: Secondary | ICD-10-CM

## 2011-12-18 NOTE — Progress Notes (Signed)
    Vicki Velasquez Date of Birth  Aug 26, 1916 [facility] [address]    [address] [place], [place]  [address] [phone]  Fax  [phone]  History of Present Illness:  Vicki Velasquez is a 77 year old female with history of a bioprosthetic aortic valve.  She had some hypertension when I saw her last.  She's been watching her salt intake. We started on some Lasix and potassium and she's feeling quite better. She doesn't like the fact that she has to go to the bathroom so much.  Current Outpatient Prescriptions on File Prior to Visit  Medication Sig Dispense Refill  . aspirin 325 MG tablet Take 325 mg by mouth daily.        . furosemide (LASIX) 20 MG tablet Take 1 tablet (20 mg total) by mouth daily.  30 tablet  11  . levothyroxine (SYNTHROID, LEVOTHROID) 75 MCG tablet Take 75 mcg by mouth daily.        Marland Kitchen losartan-hydrochlorothiazide (HYZAAR) 100-25 MG per tablet Take 1 tablet by mouth daily.        . metoprolol (LOPRESSOR) 50 MG tablet Take 50 mg by mouth daily.        . Multiple Vitamin (MULTI-VITAMIN PO) Take by mouth.        Marland Kitchen omeprazole (PRILOSEC) 20 MG capsule Take 20 mg by mouth daily.        . OXYCODONE HCL PO Take 5 mg by mouth as needed.       . potassium chloride (MICRO-K) 10 MEQ CR capsule Take 1 capsule (10 mEq total) by mouth daily.  30 capsule  5    Allergies  Allergen Reactions  . Biaxin   . Codeine   . Demerol   . Prednisone     Past Medical History  Diagnosis Date  . Atrial flutter   . Hypothyroidism   . Hypertension   . Edema     Leg  . Atrial fibrillation   . Dyspnea   . SOB (shortness of breath) 12/16/10    At rest  . Chronic lung disease     Stable  . Cardiomegaly     Stable    Past Surgical History  Procedure Date  . Aortic valve replacement     History  Smoking status  . Never Smoker   Smokeless tobacco  . Not on file    History  Alcohol Use No    No family history on file.  Reviw of Systems:  Reviewed in the HPI.   All other systems are negative.  Physical Exam: BP 148/78  Pulse 55  Ht 5\' 4"  (1.626 m)  Wt 126 lb 12.8 oz (57.516 kg)  BMI 21.77 kg/m2 The patient is alert and oriented x 3.  The mood and affect are normal.   Skin: warm and dry.  Color is normal.  She was examined in the chair she could not get up on the exam table.  HEENT:   Normocephalic/atraumatic. She is no JVD. Carotids are normal.  Lungs: Lungs are clear.   Heart: Irregularly irregular. It is soft murmur.    Abdomen: Good bowel sounds. There is no hepatosplenomegaly.  Extremities:  Shows trace edema.  Neuro:  The exam is nonfocal. She is generally weak but appropriate for a 77 year old.    ECG: Atrial fibrillation with a left axis deviation and left bundle branch block.  Assessment / Plan:

## 2011-12-18 NOTE — Patient Instructions (Signed)
Your physician wants you to follow-up in: 1 year, You will receive a reminder letter in the mail two months in advance. If you don't receive a letter, please call our office to schedule the follow-up appointment.  Your physician recommends that you continue on your current medications as directed. Please refer to the Current Medication list given to you today. 

## 2011-12-19 ENCOUNTER — Encounter: Payer: Self-pay | Admitting: Cardiovascular Disease

## 2011-12-19 NOTE — Assessment & Plan Note (Signed)
Vicki Velasquez is doing very well from a cardiac standpoint. We will continue with her same medications. She's not having any symptoms.

## 2011-12-19 NOTE — Assessment & Plan Note (Signed)
Stable

## 2012-01-27 ENCOUNTER — Other Ambulatory Visit: Payer: Self-pay

## 2012-01-27 ENCOUNTER — Inpatient Hospital Stay (HOSPITAL_COMMUNITY)
Admission: EM | Admit: 2012-01-27 | Discharge: 2012-02-03 | DRG: 470 | Disposition: A | Discharge status: Skilled Nursing Facility | Payer: Medicare Other | Attending: Internal Medicine | Admitting: Internal Medicine

## 2012-01-27 ENCOUNTER — Emergency Department (HOSPITAL_COMMUNITY): Payer: Medicare Other

## 2012-01-27 ENCOUNTER — Encounter (HOSPITAL_COMMUNITY): Payer: Self-pay | Admitting: *Deleted

## 2012-01-27 DIAGNOSIS — R110 Nausea: Secondary | ICD-10-CM | POA: Diagnosis not present

## 2012-01-27 DIAGNOSIS — Z952 Presence of prosthetic heart valve: Secondary | ICD-10-CM

## 2012-01-27 DIAGNOSIS — I4891 Unspecified atrial fibrillation: Secondary | ICD-10-CM | POA: Diagnosis present

## 2012-01-27 DIAGNOSIS — I10 Essential (primary) hypertension: Secondary | ICD-10-CM | POA: Diagnosis present

## 2012-01-27 DIAGNOSIS — R296 Repeated falls: Secondary | ICD-10-CM | POA: Diagnosis present

## 2012-01-27 DIAGNOSIS — E860 Dehydration: Secondary | ICD-10-CM | POA: Diagnosis present

## 2012-01-27 DIAGNOSIS — I517 Cardiomegaly: Secondary | ICD-10-CM | POA: Diagnosis present

## 2012-01-27 DIAGNOSIS — J84112 Idiopathic pulmonary fibrosis: Secondary | ICD-10-CM | POA: Diagnosis present

## 2012-01-27 DIAGNOSIS — R7401 Elevation of levels of liver transaminase levels: Secondary | ICD-10-CM | POA: Diagnosis present

## 2012-01-27 DIAGNOSIS — S72033A Displaced midcervical fracture of unspecified femur, initial encounter for closed fracture: Principal | ICD-10-CM | POA: Diagnosis present

## 2012-01-27 DIAGNOSIS — K8020 Calculus of gallbladder without cholecystitis without obstruction: Secondary | ICD-10-CM | POA: Diagnosis present

## 2012-01-27 DIAGNOSIS — R7402 Elevation of levels of lactic acid dehydrogenase (LDH): Secondary | ICD-10-CM | POA: Diagnosis present

## 2012-01-27 DIAGNOSIS — IMO0002 Reserved for concepts with insufficient information to code with codable children: Secondary | ICD-10-CM | POA: Diagnosis present

## 2012-01-27 DIAGNOSIS — R0600 Dyspnea, unspecified: Secondary | ICD-10-CM

## 2012-01-27 DIAGNOSIS — I509 Heart failure, unspecified: Secondary | ICD-10-CM | POA: Diagnosis present

## 2012-01-27 DIAGNOSIS — E039 Hypothyroidism, unspecified: Secondary | ICD-10-CM | POA: Diagnosis present

## 2012-01-27 DIAGNOSIS — S72002A Fracture of unspecified part of neck of left femur, initial encounter for closed fracture: Secondary | ICD-10-CM | POA: Diagnosis present

## 2012-01-27 HISTORY — DX: Other intervertebral disc degeneration, lumbosacral region: M51.37

## 2012-01-27 HISTORY — DX: Other intervertebral disc degeneration, lumbosacral region without mention of lumbar back pain or lower extremity pain: M51.379

## 2012-01-27 LAB — DIFFERENTIAL
Basophils Absolute: 0 10*3/uL (ref 0.0–0.1)
Eosinophils Relative: 1 % (ref 0–5)
Lymphocytes Relative: 11 % — ABNORMAL LOW (ref 12–46)
Monocytes Relative: 6 % (ref 3–12)
Neutrophils Relative %: 82 % — ABNORMAL HIGH (ref 43–77)

## 2012-01-27 LAB — URINALYSIS, ROUTINE W REFLEX MICROSCOPIC
Hgb urine dipstick: NEGATIVE
Leukocytes, UA: NEGATIVE
Protein, ur: NEGATIVE mg/dL
Specific Gravity, Urine: 1.015 (ref 1.005–1.030)
Urobilinogen, UA: 0.2 mg/dL (ref 0.0–1.0)

## 2012-01-27 LAB — COMPREHENSIVE METABOLIC PANEL
AST: 52 U/L — ABNORMAL HIGH (ref 0–37)
Albumin: 3.8 g/dL (ref 3.5–5.2)
Alkaline Phosphatase: 123 U/L — ABNORMAL HIGH (ref 39–117)
Chloride: 101 mEq/L (ref 96–112)
Potassium: 3.1 mEq/L — ABNORMAL LOW (ref 3.5–5.1)
Total Bilirubin: 0.4 mg/dL (ref 0.3–1.2)
Total Protein: 6.7 g/dL (ref 6.0–8.3)

## 2012-01-27 LAB — TYPE AND SCREEN
ABO/RH(D): O POS
Antibody Screen: NEGATIVE

## 2012-01-27 LAB — CBC
Platelets: 182 10*3/uL (ref 150–400)
RBC: 3.89 MIL/uL (ref 3.87–5.11)
RDW: 13.3 % (ref 11.5–15.5)
WBC: 13.2 10*3/uL — ABNORMAL HIGH (ref 4.0–10.5)

## 2012-01-27 MED ORDER — CEFAZOLIN SODIUM 1-5 GM-% IV SOLN
1.0000 g | Freq: Once | INTRAVENOUS | Status: DC
  Filled 2012-01-27: qty 50

## 2012-01-27 MED ORDER — MORPHINE SULFATE 2 MG/ML IJ SOLN
INTRAMUSCULAR | Status: AC
  Administered 2012-01-27: 1 mg via INTRAVENOUS
  Filled 2012-01-27: qty 1

## 2012-01-27 MED ORDER — POTASSIUM CHLORIDE CRYS ER 10 MEQ PO TBCR
10.0000 meq | EXTENDED_RELEASE_TABLET | Freq: Every day | ORAL | Status: DC
  Administered 2012-01-27 – 2012-01-30 (×3): 10 meq via ORAL
  Filled 2012-01-27 (×4): qty 1

## 2012-01-27 MED ORDER — SORBITOL 70 % SOLN
30.0000 mL | Freq: Every day | Status: DC | PRN
  Administered 2012-01-31: 30 mL via ORAL
  Filled 2012-01-27: qty 30

## 2012-01-27 MED ORDER — SENNOSIDES-DOCUSATE SODIUM 8.6-50 MG PO TABS
1.0000 | ORAL_TABLET | Freq: Every day | ORAL | Status: DC
  Administered 2012-01-27 – 2012-02-03 (×7): 1 via ORAL
  Filled 2012-01-27 (×8): qty 1

## 2012-01-27 MED ORDER — HEPARIN SODIUM (PORCINE) 5000 UNIT/ML IJ SOLN
5000.0000 [IU] | Freq: Three times a day (TID) | INTRAMUSCULAR | Status: AC
  Administered 2012-01-27 (×2): 5000 [IU] via SUBCUTANEOUS
  Filled 2012-01-27 (×3): qty 1

## 2012-01-27 MED ORDER — SODIUM CHLORIDE 0.9 % IJ SOLN: 3.0000 mL | INTRAMUSCULAR | Status: DC | PRN

## 2012-01-27 MED ORDER — CEFAZOLIN SODIUM 1-5 GM-% IV SOLN: 1.0000 g | Freq: Once | INTRAVENOUS | Status: DC

## 2012-01-27 MED ORDER — FENTANYL CITRATE 0.05 MG/ML IJ SOLN
50.0000 ug | Freq: Once | INTRAMUSCULAR | Status: AC
  Administered 2012-01-27: 100 ug via INTRAVENOUS
  Filled 2012-01-27: qty 2

## 2012-01-27 MED ORDER — FENTANYL CITRATE 0.05 MG/ML IJ SOLN
100.0000 ug | Freq: Once | INTRAMUSCULAR | Status: AC
  Administered 2012-01-27: 100 ug via INTRAVENOUS
  Filled 2012-01-27: qty 2

## 2012-01-27 MED ORDER — FUROSEMIDE 40 MG PO TABS
40.0000 mg | ORAL_TABLET | Freq: Every day | ORAL | Status: DC
  Administered 2012-01-27: 40 mg via ORAL
  Filled 2012-01-27 (×2): qty 1

## 2012-01-27 MED ORDER — SODIUM CHLORIDE 0.9 % IV SOLN: 250.0000 mL | INTRAVENOUS | Status: DC | PRN

## 2012-01-27 MED ORDER — OXYCODONE HCL 5 MG PO TABS
10.0000 mg | ORAL_TABLET | ORAL | Status: DC | PRN
  Administered 2012-01-27 – 2012-01-29 (×4): 10 mg via ORAL
  Filled 2012-01-27 (×4): qty 2

## 2012-01-27 MED ORDER — FAMOTIDINE 20 MG PO TABS
20.0000 mg | ORAL_TABLET | Freq: Every day | ORAL | Status: DC
  Administered 2012-01-27 – 2012-02-03 (×7): 20 mg via ORAL
  Filled 2012-01-27 (×8): qty 1

## 2012-01-27 MED ORDER — SODIUM CHLORIDE 0.9 % IJ SOLN
3.0000 mL | Freq: Two times a day (BID) | INTRAMUSCULAR | Status: DC
  Administered 2012-01-27 (×2): 3 mL via INTRAVENOUS

## 2012-01-27 MED ORDER — ASPIRIN EC 81 MG PO TBEC
81.0000 mg | DELAYED_RELEASE_TABLET | Freq: Every day | ORAL | Status: DC
  Administered 2012-01-27 – 2012-01-28 (×2): 81 mg via ORAL
  Filled 2012-01-27 (×2): qty 1

## 2012-01-27 MED ORDER — METOPROLOL SUCCINATE ER 50 MG PO TB24
50.0000 mg | ORAL_TABLET | Freq: Every day | ORAL | Status: DC
  Administered 2012-01-27 – 2012-02-03 (×8): 50 mg via ORAL
  Filled 2012-01-27 (×8): qty 1

## 2012-01-27 MED ORDER — MORPHINE SULFATE 2 MG/ML IJ SOLN: 1.0000 mg | INTRAMUSCULAR | Status: AC

## 2012-01-27 MED ORDER — ONDANSETRON HCL 4 MG/2ML IJ SOLN
4.0000 mg | Freq: Once | INTRAMUSCULAR | Status: AC
  Administered 2012-01-27: 4 mg via INTRAVENOUS
  Filled 2012-01-27: qty 2

## 2012-01-27 MED ORDER — HYDROCODONE-ACETAMINOPHEN 5-325 MG PO TABS
1.0000 | ORAL_TABLET | ORAL | Status: DC | PRN
  Administered 2012-01-27 (×5): 1 via ORAL
  Filled 2012-01-27 (×5): qty 1

## 2012-01-27 MED ORDER — POTASSIUM CHLORIDE 10 MEQ PO CPCR: 10.0000 meq | ORAL_CAPSULE | Freq: Every day | ORAL | Status: DC

## 2012-01-27 MED ORDER — LEVOTHYROXINE SODIUM 75 MCG PO TABS
75.0000 ug | ORAL_TABLET | Freq: Every day | ORAL | Status: DC
  Administered 2012-01-27 – 2012-02-03 (×7): 75 ug via ORAL
  Filled 2012-01-27 (×8): qty 1

## 2012-01-27 NOTE — ED Notes (Signed)
Pt fell while walking to bathroom, no LOC

## 2012-01-27 NOTE — ED Notes (Signed)
Advised by nurse tech that pt requesting pain meds. Pt reports that pain has returned in left hip. MD notified. Awaiting further orders.

## 2012-01-27 NOTE — ED Notes (Signed)
Pt resting on stretcher with eyes closed at this time. Visible chest rise and fall present. Son at bedside, who was advised of bed assignment. No acute distress noted at this time.

## 2012-01-27 NOTE — Consult Note (Signed)
Reason for Consult:left hip fracture Referring Physician: ERMD  Vicki Velasquez is an 78 y.o. female.  ZOX:WRUE on way to bathroom  Past Medical History  Diagnosis Date  . Atrial flutter   . Hypothyroidism   . Hypertension   . Edema     Leg  . Atrial fibrillation   . Dyspnea   . SOB (shortness of breath) 12/16/10    At rest  . Chronic lung disease     Stable  . Cardiomegaly     Stable    Past Surgical History  Procedure Date  . Aortic valve replacement     History reviewed. No pertinent family history.  Social History:  reports that she has never smoked. She does not have any smokeless tobacco history on file. She reports that she does not drink alcohol. Her drug history not on file.  Allergies:  Allergies  Allergen Reactions  . Biaxin   . Codeine   . Demerol   . Prednisone     Medications: I have reviewed the patient's current medications.  Results for orders placed during the hospital encounter of 01/27/12 (from the past 48 hour(s))  TYPE AND SCREEN     Status: Normal (Preliminary result)   Collection Time   01/27/12  4:50 AM      Component Value Range Comment   ABO/RH(D) O POS      Antibody Screen PENDING      Sample Expiration 01/30/2012     URINALYSIS, ROUTINE W REFLEX MICROSCOPIC     Status: Normal   Collection Time   01/27/12  4:55 AM      Component Value Range Comment   Color, Urine YELLOW  YELLOW     APPearance CLEAR  CLEAR     Specific Gravity, Urine 1.015  1.005 - 1.030     pH 5.5  5.0 - 8.0     Glucose, UA NEGATIVE  NEGATIVE (mg/dL)    Hgb urine dipstick NEGATIVE  NEGATIVE     Bilirubin Urine NEGATIVE  NEGATIVE     Ketones, ur NEGATIVE  NEGATIVE (mg/dL)    Protein, ur NEGATIVE  NEGATIVE (mg/dL)    Urobilinogen, UA 0.2  0.0 - 1.0 (mg/dL)    Nitrite NEGATIVE  NEGATIVE     Leukocytes, UA NEGATIVE  NEGATIVE  MICROSCOPIC NOT DONE ON URINES WITH NEGATIVE PROTEIN, BLOOD, LEUKOCYTES, NITRITE, OR GLUCOSE <1000 mg/dL.    Dg Chest 1  View  01/27/2012  *RADIOLOGY REPORT*  Clinical Data: Left hip fracture.  Hypertension and A fib.  CHEST - 1 VIEW  Comparison: 10/02/2011  Findings: Stable postoperative changes in the mediastinum.  Shallow inspiration.  Mild cardiac enlargement and prominent pulmonary vascularity without edema suggesting early congestive changes. Interstitial fibrosis in the lung bases.  Mild interstitial edema is not excluded.  No focal airspace consolidation.  Calcification of the aorta.  IMPRESSION: Mild congestive changes in the heart and lungs with interstitial fibrosis and possible interstitial edema.  No focal consolidation.  Original Report Authenticated By: Marlon Pel, M.D.   Dg Hip Complete Left  01/27/2012  *RADIOLOGY REPORT*  Clinical Data: Left hip pain after fall  LEFT HIP - COMPLETE 2+ VIEW  Comparison: None.  Findings: A transverse subcapital fracture of the left femoral neck with varus angulation.  Degenerative changes in the lower lumbar spine and hips.  Calcifications in the pelvis consistent with phleboliths.  Vascular calcifications.  Pelvis appears intact.  IMPRESSION: Transverse fracture of the left femoral neck with varus  angulation.  Original Report Authenticated By: Marlon Pel, M.D.    Review of Systems  Constitutional: Negative.   HENT: Negative.   Respiratory: Negative.   Cardiovascular: Negative.   Gastrointestinal: Negative.   Genitourinary: Negative.   Musculoskeletal: Positive for joint pain.  Skin: Negative.   Neurological: Negative.   Endo/Heme/Allergies: Negative.   Psychiatric/Behavioral: Negative.    Blood pressure 153/79, pulse 87, temperature 98.3 F (36.8 C), temperature source Oral, resp. rate 16, SpO2 95.00%. Physical Exam  Constitutional: She is oriented to person, place, and time. She appears well-nourished.  HENT:  Head: Normocephalic and atraumatic.  Eyes: EOM are normal.  Neck: Normal range of motion.  Cardiovascular:       flutter   Respiratory: Effort normal and breath sounds normal.  GI: Soft. Bowel sounds are normal.  Musculoskeletal: She exhibits tenderness.       Painful ROM left hip. Short. ER. NVI  Neurological: She is alert and oriented to person, place, and time.  Skin: Skin is warm.  Psychiatric: She has a normal mood and affect.    Assessment/Plan: Left femoral hip fracture displaced. Plan hemiarthroplasty following medical clearance. Risks discussed. Consult. [phone]  Duglas Heier C 01/27/2012, 6:00 AM

## 2012-01-27 NOTE — ED Provider Notes (Signed)
History     CSN: [id number]  Arrival date & time 01/27/12  4098   First MD Initiated Contact with Patient 01/27/12 0410      Chief Complaint  Patient presents with  . Fall    left hip fracture,  lef tleg shortened and rotated     Patient is a 78 y.o. female presenting with fall. The history is provided by the patient and a relative.  Fall The accident occurred 1 to 2 hours ago. The fall occurred while walking. The point of impact was the left hip. The pain is present in the left hip. The pain is moderate. Pertinent negatives include no abdominal pain, no vomiting, no headaches and no loss of consciousness. The symptoms are aggravated by flexion.  her course is worsening Rest improves her symptoms  Pt reports she was trying to walk earlier, and she fell She denies proceeding cp/sob/dizziness She reports h/o frequent falls She is not on anticoagulants She may have hit her head but she denies LOC and no headache/weakness/vomiting reported  Past Medical History  Diagnosis Date  . Atrial flutter   . Hypothyroidism   . Hypertension   . Edema     Leg  . Atrial fibrillation   . Dyspnea   . SOB (shortness of breath) 12/16/10    At rest  . Chronic lung disease     Stable  . Cardiomegaly     Stable    Past Surgical History  Procedure Date  . Aortic valve replacement     No family history on file.  History  Substance Use Topics  . Smoking status: Never Smoker   . Smokeless tobacco: Not on file  . Alcohol Use: No    OB History    Grav Para Term Preterm Abortions TAB SAB Ect Mult Living                  Review of Systems  Gastrointestinal: Negative for vomiting and abdominal pain.  Neurological: Negative for loss of consciousness and headaches.  All other systems reviewed and are negative.    Allergies  Biaxin; Codeine; Demerol; and Prednisone  Home Medications   Current Outpatient Rx  Name Route Sig Dispense Refill  . ASPIRIN EC 81 MG PO TBEC Oral Take  81 mg by mouth daily.    Marland Kitchen VITAMIN D PO Oral Take by mouth.      . FUROSEMIDE 20 MG PO TABS Oral Take 1 tablet (20 mg total) by mouth daily. 30 tablet 11  . LEVOTHYROXINE SODIUM 75 MCG PO TABS Oral Take 75 mcg by mouth daily.      Marland Kitchen LOSARTAN POTASSIUM-HCTZ 100-25 MG PO TABS Oral Take 1 tablet by mouth daily.      Marland Kitchen METOPROLOL SUCCINATE ER 50 MG PO TB24 Oral Take 50 mg by mouth daily. Take with or immediately following a meal.    . ADULT MULTIVITAMIN W/MINERALS CH Oral Take 1 tablet by mouth daily.    Marland Kitchen OMEPRAZOLE 20 MG PO CPDR Oral Take 20 mg by mouth daily.      . OXYCODONE HCL PO Oral Take 5 mg by mouth every 6 (six) hours as needed. Pain    . POTASSIUM CHLORIDE 10 MEQ PO CPCR Oral Take 1 capsule (10 mEq total) by mouth daily. 30 capsule 5  . SENNA-DOCUSATE SODIUM 8.6-50 MG PO TABS Oral Take 1 tablet by mouth daily.      Marland Kitchen RANITIDINE HCL 75 MG PO TABS Oral Take 75 mg  by mouth at bedtime.        BP 164/65  Pulse 81  Temp(Src) 98.3 F (36.8 C) (Oral)  Resp 16  SpO2 93%  Physical Exam CONSTITUTIONAL: Well developed/well nourished HEAD AND FACE: Normocephalic/atraumatic EYES: EOMI ENMT: Mucous membranes moist NECK: supple no meningeal signs SPINE:cervical spine nontender, NEXUS criteria met CV: irregular, no harsh murmurs noted LUNGS: Lungs are clear to auscultation bilaterally, no apparent distress ABDOMEN: soft, nontender, no rebound or guarding GU:no cva tenderness NEURO: Pt is awake/alert, moves all extremitiesx4 EXTREMITIES: distal pulses intact, tenderness noted to left hip with ROM of left hip All other extremities/joints palpated/ranged and nontender SKIN: warm, color normal PSYCH: no abnormalities of mood noted  ED Course  Procedures  4:51 AM Suspect hip injury but pt otherwise stable No signs of head injury, pt awake/alert, appropriate  5:24 AM Updated patient/family on xray findings  5:40 AM D/w dr Gaspar Skeeters is aware of patient, he reviewed xray, admit to  medicine  6:49 AM D/w dr Kaylyn Layer, [facility], will admit   MDM  Nursing notes reviewed and considered in documentation xrays reviewed and considered All labs/vitals reviewed and considered        Date: 01/27/2012  Rate: 90  Rhythm: atrial fibrillation  QRS Axis: left  Intervals: QT prolonged  ST/T Wave abnormalities: nonspecific ST changes  Conduction Disutrbances:left bundle branch block  Narrative Interpretation:   Old EKG Reviewed: unchanged    Joya Gaskins, MD 01/27/12 [phone]

## 2012-01-27 NOTE — H&P (Signed)
PCP:   Kimber Relic, MD, MD   Subspecialists involved ; Dr. Ellard Artis of cardiology (not consulted this admission) Dr. Jillyn Hidden of orthopedics  Chief Complaint:  Fall  93 CF,  known atrial fibrillation not on Coumadin, atrial flutter, hypothyroidism, hypertension, cardiomegaly, degenerative disc disease, porcine AV valve placement 2002, brain surgery for tumor 1970s  Got up this am to go the rest room and was going to the rest-room and was going to rub some lotion on her leg as it was hurting.  Swayed backwards and -this does happen occasionally-had a signifcant fall maybe 2 years ago.  Tried to get to the telephone and .  Usually uses a walker, but didn;t use it to get to the rest-room.  No dizzyness at the time of the fall.  Just felt unbalanced.  No Chest pain at time of fall.  No blurred vision or othe rissues.  Vision usually is good.  Not on coumadin for atrial fibrillation. In 10 on 10 pain right now because of the fracture. And requesting pain medications  Workup showed potassium 3.1 ENT creatinine of thirty-two over one 0.08 Plan phosphatase of 123 XI leukocytosis 13.2 hemoglobin 11.6 hematocrit of 35.4 the mild left shift  no history of diabetes but her random glucose is 145, she was typed and screened for blood Urinalysis is negative but urine culture done and pending Chest x-ray = Mild congestive changes in the heart and lungs with interstitial  fibrosis and possible interstitial edema. No focal consolidation. Hip x-ray=Transverse fracture of the left femoral neck with varus angulation.  Review of Systems:  No dysuria, no cough or cold, no fever +phlegm.  No wekaness on any one side of the body when she had the fall. Unwitnessed fall, no seizure-like activity no biting of tongue no bowel or bladder incontinence, falls was onto a hard surface.  Past Medical History: Past Medical History  Diagnosis Date  . Atrial flutter   . Hypothyroidism   . Hypertension   . Edema     Leg  .  Atrial fibrillation   . Dyspnea   . SOB (shortness of breath) 12/16/10    At rest  . Chronic lung disease     Stable  . Cardiomegaly     Stable    Past surgical history: Past Surgical History  Procedure Date  . Aortic valve replacement     Medications: Prior to Admission medications   Medication Sig Start Date End Date Taking? Authorizing Provider  aspirin EC 81 MG tablet Take 81 mg by mouth daily.   Yes Historical Provider, MD  Cholecalciferol (VITAMIN D PO) Take by mouth.     Yes Historical Provider, MD  furosemide (LASIX) 20 MG tablet Take 1 tablet (20 mg total) by mouth daily. 10/02/11 10/01/12 Yes Elyn Aquas., MD  levothyroxine (SYNTHROID, LEVOTHROID) 75 MCG tablet Take 75 mcg by mouth daily.     Yes Historical Provider, MD  losartan-hydrochlorothiazide (HYZAAR) 100-25 MG per tablet Take 1 tablet by mouth daily.     Yes Historical Provider, MD  metoprolol succinate (TOPROL-XL) 50 MG 24 hr tablet Take 50 mg by mouth daily. Take with or immediately following a meal.   Yes Historical Provider, MD  Multiple Vitamin (MULITIVITAMIN WITH MINERALS) TABS Take 1 tablet by mouth daily.   Yes Historical Provider, MD  omeprazole (PRILOSEC) 20 MG capsule Take 20 mg by mouth daily.     Yes Historical Provider, MD  OXYCODONE HCL PO Take 5 mg by mouth  every 6 (six) hours as needed. Pain   Yes Historical Provider, MD  potassium chloride (MICRO-K) 10 MEQ CR capsule Take 1 capsule (10 mEq total) by mouth daily. 10/02/11 09/26/12 Yes Elyn Aquas., MD  ranitidine (ZANTAC) 75 MG tablet Take 75 mg by mouth at bedtime.     Yes Historical Provider, MD  sennosides-docusate sodium (SENOKOT-S) 8.6-50 MG tablet Take 1 tablet by mouth daily.     Yes Historical Provider, MD    Allergies:   Allergies  Allergen Reactions  . Biaxin   . Codeine   . Demerol   . Prednisone     Social History:  reports that she has never smoked. She does not have any smokeless tobacco history on file. She reports  that she does not drink alcohol. Her drug history not on file.  Family History: History reviewed. No pertinent family history.  Physical Exam: Filed Vitals:   01/27/12 0551 01/27/12 0727 01/27/12 0743 01/27/12 0832  BP: 153/79 140/78 138/62 138/79  Pulse: 87  84 91  Temp:  97.9 F (36.6 C)  98.2 F (36.8 C)  TempSrc:  Oral  Oral  Resp:  17 18 18   Height:    5\' 2"  (1.575 m)  SpO2: 95% 100% 100% 96%    HEENT-alert oriented slim Caucasian female in some painful distress. Mild JVD. No prior 3. Trachea slightly enlarged. No pallor no icterus throat clear CHEST-clinically clear no added sounds CARDS-S1-S2 tachycardic to low 100s in nature fibrillation possible murmur left lower sternal edge ABD-soft nontender nondistended SKIN-no lower extremity edema NEURO- grossly intact power bilaterally there does appear to be some rotation of her l hip.  Painful GU-deferred  Labs on Admission:   [facility] 01/27/12 0450  NA 138  K 3.1*  CL 101  CO2 24  GLUCOSE 145*  BUN 32*  CREATININE 1.08  CALCIUM 9.9  MG --  PHOS --    Basename 01/27/12 0450  AST 52*  ALT 42*  ALKPHOS 123*  BILITOT 0.4  PROT 6.7  ALBUMIN 3.8   No results found for this basename: LIPASE:2,AMYLASE:2 in the last 72 hours  Basename 01/27/12 0450  WBC 13.2*  NEUTROABS 10.8*  HGB 11.6*  HCT 35.4*  MCV 91.0  PLT 182   No results found for this basename: CKTOTAL:3,CKMB:3,CKMBINDEX:3,TROPONINI:3 in the last 72 hours No results found for this basename: TSH,T4TOTAL,FREET3,T3FREE,THYROIDAB in the last 72 hours No results found for this basename: VITAMINB12:2,FOLATE:2,FERRITIN:2,TIBC:2,IRON:2,RETICCTPCT:2 in the last 72 hours  Radiological Exams on Admission: Dg Chest 1 View  01/27/2012  *RADIOLOGY REPORT*  Clinical Data: Left hip fracture.  Hypertension and A fib.  CHEST - 1 VIEW  Comparison: 10/02/2011  Findings: Stable postoperative changes in the mediastinum.  Shallow inspiration.  Mild cardiac enlargement and  prominent pulmonary vascularity without edema suggesting early congestive changes. Interstitial fibrosis in the lung bases.  Mild interstitial edema is not excluded.  No focal airspace consolidation.  Calcification of the aorta.  IMPRESSION: Mild congestive changes in the heart and lungs with interstitial fibrosis and possible interstitial edema.  No focal consolidation.  Original Report Authenticated By: Marlon Pel, M.D.   Dg Hip Complete Left  01/27/2012  *RADIOLOGY REPORT*  Clinical Data: Left hip pain after fall  LEFT HIP - COMPLETE 2+ VIEW  Comparison: None.  Findings: A transverse subcapital fracture of the left femoral neck with varus angulation.  Degenerative changes in the lower lumbar spine and hips.  Calcifications in the pelvis consistent with phleboliths.  Vascular  calcifications.  Pelvis appears intact.  IMPRESSION: Transverse fracture of the left femoral neck with varus angulation.  Original Report Authenticated By: Marlon Pel, M.D.    Assessment/Plan 1. Left from oral neck fracture with fasting admission-Dr. Jillyn Hidden is a very seen patient plans on operative fixation. I believe she is at intermediate to high risk given some of her cardiac issues that she is not coronary artery disease continuing her aspirin and a beta blocker perioperatively should be sufficient.  Pain management with IV pain medicines and oral medications until surgery.  2. Likely CHF, I do not see an echo on file but do not believe that this is an 60 prior to evaluation of her for surgery. She does appear to be clinically slight dry however has mild volume depletion. As such I'll increase her Lasix by mouth from 20-40 mg daily short-term and status post surgery she may benefit from being on IV fluids at a low rate. I would hesitate placed on fluids at present time.  We'll obtain a BNP. An EKG perioperatively has not been done and we will obtain the same and now review this prior to surgery  3. Atrial  fibrillation on Coumadin-patient will need perioperative beta blocker and aspirin as discussed above.  She appears rate controlled with a heart rate in the low 100s.  4. Hypertension-I have held her ARB/thiazide combination. She will probably need slightly increased beta-blockade given her mild tachycardia which is likely secondary to pain.  5 hypothyroid-patient will continue her thyroxine at home dosage  6. Aortic valve replacement-patient will require peri-operative beta-blockade and review as an outpatient for these issues.  7. Chronic lung disease-this is stable.  We'll check O2 sats off of oxygen and reassess  60 minutes in time spent on admitting this patient  Hip fracture Asia Dusenbury,JAI 01/27/2012, 9:08 AM

## 2012-01-27 NOTE — Progress Notes (Signed)
BNP results called to Dr. Mahala Menghini no new orders given at this time.

## 2012-01-27 NOTE — ED Notes (Signed)
Pt received 100 mcg Fentanyl enroute via EMS,  20 gauge left wrist

## 2012-01-27 NOTE — ED Notes (Signed)
ZOX:[id number]<AV> Expected date:01/27/12<BR> Expected time: 3:13 AM<BR> Means of arrival:Ambulance<BR> Comments:<BR> Hip fx

## 2012-01-28 ENCOUNTER — Inpatient Hospital Stay (HOSPITAL_COMMUNITY): Payer: Medicare Other

## 2012-01-28 ENCOUNTER — Encounter (HOSPITAL_COMMUNITY): Payer: Self-pay | Admitting: Anesthesiology

## 2012-01-28 ENCOUNTER — Encounter (HOSPITAL_COMMUNITY)
Admission: EM | Disposition: A | Discharge status: Skilled Nursing Facility | Payer: Self-pay | Source: Home / Self Care | Attending: Internal Medicine

## 2012-01-28 ENCOUNTER — Inpatient Hospital Stay (HOSPITAL_COMMUNITY): Payer: Medicare Other | Admitting: Anesthesiology

## 2012-01-28 HISTORY — PX: HIP ARTHROPLASTY: SHX981

## 2012-01-28 LAB — BASIC METABOLIC PANEL
BUN: 31 mg/dL — ABNORMAL HIGH (ref 6–23)
Calcium: 9.8 mg/dL (ref 8.4–10.5)
GFR calc non Af Amer: 39 mL/min — ABNORMAL LOW (ref >90)
Glucose, Bld: 123 mg/dL — ABNORMAL HIGH (ref 70–99)
Sodium: 139 mEq/L (ref 135–145)

## 2012-01-28 LAB — CBC
HCT: 33.9 % — ABNORMAL LOW (ref 36.0–46.0)
Hemoglobin: 11.3 g/dL — ABNORMAL LOW (ref 12.0–15.0)
MCH: 30.4 pg (ref 26.0–34.0)
MCHC: 33.3 g/dL (ref 30.0–36.0)

## 2012-01-28 LAB — URINE CULTURE: Culture: NO GROWTH

## 2012-01-28 SURGERY — HEMIARTHROPLASTY, HIP, DIRECT ANTERIOR APPROACH, FOR FRACTURE
Anesthesia: General | Site: Hip | Laterality: Left | Wound class: Clean

## 2012-01-28 MED ORDER — FUROSEMIDE 20 MG PO TABS
20.0000 mg | ORAL_TABLET | Freq: Every day | ORAL | Status: DC
  Administered 2012-01-29 – 2012-02-03 (×6): 20 mg via ORAL
  Filled 2012-01-28 (×8): qty 1

## 2012-01-28 MED ORDER — HYDROMORPHONE HCL PF 1 MG/ML IJ SOLN: 0.2500 mg | INTRAMUSCULAR | Status: DC | PRN

## 2012-01-28 MED ORDER — CEFAZOLIN SODIUM 1-5 GM-% IV SOLN: 1.0000 g | Freq: Four times a day (QID) | INTRAVENOUS | Status: DC

## 2012-01-28 MED ORDER — SODIUM CHLORIDE 0.9 % IV SOLN: 250.0000 mL | INTRAVENOUS | Status: DC | PRN

## 2012-01-28 MED ORDER — FENTANYL CITRATE 0.05 MG/ML IJ SOLN
INTRAMUSCULAR | Status: DC | PRN
  Administered 2012-01-28 (×4): 50 ug via INTRAVENOUS

## 2012-01-28 MED ORDER — CEFAZOLIN SODIUM 1-5 GM-% IV SOLN
INTRAVENOUS | Status: DC | PRN
  Administered 2012-01-28: 1 g via INTRAVENOUS

## 2012-01-28 MED ORDER — ETOMIDATE 2 MG/ML IV SOLN
INTRAVENOUS | Status: DC | PRN
  Administered 2012-01-28: 12 mg via INTRAVENOUS

## 2012-01-28 MED ORDER — LIP MEDEX EX OINT
TOPICAL_OINTMENT | CUTANEOUS | Status: AC
  Administered 2012-01-28: 10:00:00
  Filled 2012-01-28: qty 7

## 2012-01-28 MED ORDER — ONDANSETRON HCL 4 MG/2ML IJ SOLN
INTRAMUSCULAR | Status: DC | PRN
  Administered 2012-01-28: 4 mg via INTRAVENOUS

## 2012-01-28 MED ORDER — ASPIRIN EC 81 MG PO TBEC
81.0000 mg | DELAYED_RELEASE_TABLET | Freq: Every day | ORAL | Status: DC
  Filled 2012-01-28: qty 1

## 2012-01-28 MED ORDER — METHOCARBAMOL 500 MG PO TABS
500.0000 mg | ORAL_TABLET | Freq: Four times a day (QID) | ORAL | Status: DC | PRN
  Filled 2012-01-28: qty 1

## 2012-01-28 MED ORDER — CEFAZOLIN SODIUM 1-5 GM-% IV SOLN
1.0000 g | Freq: Three times a day (TID) | INTRAVENOUS | Status: AC
  Administered 2012-01-28 – 2012-01-29 (×3): 1 g via INTRAVENOUS
  Filled 2012-01-28 (×3): qty 50

## 2012-01-28 MED ORDER — LACTATED RINGERS IV SOLN: INTRAVENOUS | Status: DC

## 2012-01-28 MED ORDER — PHENYLEPHRINE HCL 10 MG/ML IJ SOLN
INTRAMUSCULAR | Status: DC | PRN
  Administered 2012-01-28 (×2): 80 ug via INTRAVENOUS
  Administered 2012-01-28: 160 ug via INTRAVENOUS

## 2012-01-28 MED ORDER — STERILE WATER FOR IRRIGATION IR SOLN
Status: DC | PRN
  Administered 2012-01-28: 3000 mL

## 2012-01-28 MED ORDER — ONDANSETRON HCL 4 MG/2ML IJ SOLN
4.0000 mg | Freq: Four times a day (QID) | INTRAMUSCULAR | Status: DC | PRN
  Administered 2012-01-31: 4 mg via INTRAVENOUS
  Filled 2012-01-28: qty 2

## 2012-01-28 MED ORDER — MENTHOL 3 MG MT LOZG
1.0000 | LOZENGE | OROMUCOSAL | Status: DC | PRN
  Filled 2012-01-28: qty 9

## 2012-01-28 MED ORDER — LIDOCAINE HCL (CARDIAC) 20 MG/ML IV SOLN
INTRAVENOUS | Status: DC | PRN
  Administered 2012-01-28: 100 mg via INTRAVENOUS

## 2012-01-28 MED ORDER — PHENOL 1.4 % MT LIQD
1.0000 | OROMUCOSAL | Status: DC | PRN
  Filled 2012-01-28: qty 177

## 2012-01-28 MED ORDER — LACTATED RINGERS IV SOLN
INTRAVENOUS | Status: DC | PRN
  Administered 2012-01-28: 15:00:00 via INTRAVENOUS

## 2012-01-28 MED ORDER — POLYETHYLENE GLYCOL 3350 17 G PO PACK
17.0000 g | PACK | Freq: Every day | ORAL | Status: DC | PRN
  Filled 2012-01-28: qty 1

## 2012-01-28 MED ORDER — ACETAMINOPHEN 10 MG/ML IV SOLN
INTRAVENOUS | Status: DC | PRN
  Administered 2012-01-28: 1000 mg via INTRAVENOUS

## 2012-01-28 MED ORDER — ROCURONIUM BROMIDE 100 MG/10ML IV SOLN
INTRAVENOUS | Status: DC | PRN
  Administered 2012-01-28: 30 mg via INTRAVENOUS
  Administered 2012-01-28: 5 mg via INTRAVENOUS

## 2012-01-28 MED ORDER — SODIUM CHLORIDE 0.9 % IV SOLN
INTRAVENOUS | Status: DC
  Administered 2012-01-28 – 2012-01-29 (×2): via INTRAVENOUS

## 2012-01-28 MED ORDER — ONDANSETRON HCL 4 MG PO TABS: 4.0000 mg | ORAL_TABLET | Freq: Four times a day (QID) | ORAL | Status: DC | PRN

## 2012-01-28 MED ORDER — MORPHINE SULFATE 10 MG/ML IJ SOLN
0.5000 mg | INTRAMUSCULAR | Status: DC | PRN
  Administered 2012-01-31 – 2012-02-01 (×2): 0.5 mg via INTRAVENOUS
  Filled 2012-01-28 (×2): qty 1

## 2012-01-28 MED ORDER — ACETAMINOPHEN 325 MG PO TABS
650.0000 mg | ORAL_TABLET | Freq: Four times a day (QID) | ORAL | Status: DC | PRN
  Administered 2012-01-30 – 2012-01-31 (×2): 650 mg via ORAL
  Filled 2012-01-28 (×2): qty 2

## 2012-01-28 MED ORDER — MEPERIDINE HCL 25 MG/ML IJ SOLN: 6.2500 mg | INTRAMUSCULAR | Status: DC | PRN

## 2012-01-28 MED ORDER — ACETAMINOPHEN 650 MG RE SUPP: 650.0000 mg | Freq: Four times a day (QID) | RECTAL | Status: DC | PRN

## 2012-01-28 MED ORDER — NEOSTIGMINE METHYLSULFATE 1 MG/ML IJ SOLN
INTRAMUSCULAR | Status: DC | PRN
  Administered 2012-01-28: 3 mg via INTRAVENOUS

## 2012-01-28 MED ORDER — METHOCARBAMOL 100 MG/ML IJ SOLN
500.0000 mg | Freq: Four times a day (QID) | INTRAVENOUS | Status: DC | PRN
  Filled 2012-01-28: qty 5

## 2012-01-28 MED ORDER — EPHEDRINE SULFATE 50 MG/ML IJ SOLN
INTRAMUSCULAR | Status: DC | PRN
  Administered 2012-01-28: 5 mg via INTRAVENOUS

## 2012-01-28 MED ORDER — 0.9 % SODIUM CHLORIDE (POUR BTL) OPTIME
TOPICAL | Status: DC | PRN
  Administered 2012-01-28: 1000 mL

## 2012-01-28 MED ORDER — GLYCOPYRROLATE 0.2 MG/ML IJ SOLN
INTRAMUSCULAR | Status: DC | PRN
  Administered 2012-01-28: .4 mg via INTRAVENOUS

## 2012-01-28 MED ORDER — PROMETHAZINE HCL 25 MG/ML IJ SOLN: 6.2500 mg | INTRAMUSCULAR | Status: DC | PRN

## 2012-01-28 SURGICAL SUPPLY — 49 items
BAG SPEC THK2 15X12 ZIP CLS (MISCELLANEOUS) ×2
BAG ZIPLOCK 12X15 (MISCELLANEOUS) ×4 IMPLANT
BLADE SAW SGTL 18X1.27X75 (BLADE) ×3 IMPLANT
BRUSH FEMORAL CANAL (MISCELLANEOUS) ×1 IMPLANT
CHLORAPREP W/TINT 26ML (MISCELLANEOUS) IMPLANT
CLOTH BEACON ORANGE TIMEOUT ST (SAFETY) ×2 IMPLANT
DRAPE INCISE IOBAN 66X45 STRL (DRAPES) ×3 IMPLANT
DRAPE ORTHO SPLIT 77X108 STRL (DRAPES) ×2
DRAPE POUCH INSTRU U-SHP 10X18 (DRAPES) ×2 IMPLANT
DRAPE SURG ORHT 6 SPLT 77X108 (DRAPES) ×1 IMPLANT
DRAPE U-SHAPE 47X51 STRL (DRAPES) ×2 IMPLANT
DRSG EMULSION OIL 3X16 NADH (GAUZE/BANDAGES/DRESSINGS) ×2 IMPLANT
DRSG MEPILEX BORDER 4X8 (GAUZE/BANDAGES/DRESSINGS) ×1 IMPLANT
DRSG PAD ABDOMINAL 8X10 ST (GAUZE/BANDAGES/DRESSINGS) ×2 IMPLANT
DURAPREP 26ML APPLICATOR (WOUND CARE) ×2 IMPLANT
ELECT REM PT RETURN 9FT ADLT (ELECTROSURGICAL) ×2
ELECTRODE REM PT RTRN 9FT ADLT (ELECTROSURGICAL) ×1 IMPLANT
EVACUATOR 1/8 PVC DRAIN (DRAIN) ×1 IMPLANT
FACESHIELD LNG OPTICON STERILE (SAFETY) ×7 IMPLANT
GLOVE BIO SURGEON STRL SZ 6.5 (GLOVE) ×2 IMPLANT
GLOVE BIOGEL PI IND STRL 6.5 (GLOVE) ×1 IMPLANT
GLOVE BIOGEL PI IND STRL 8 (GLOVE) ×1 IMPLANT
GLOVE BIOGEL PI INDICATOR 6.5 (GLOVE) ×1
GLOVE BIOGEL PI INDICATOR 8 (GLOVE) ×1
GLOVE ECLIPSE 6.5 STRL STRAW (GLOVE) ×2 IMPLANT
GLOVE SURG SS PI 8.0 STRL IVOR (GLOVE) ×4 IMPLANT
GOWN PREVENTION PLUS LG XLONG (DISPOSABLE) ×2 IMPLANT
GOWN PREVENTION PLUS XLARGE (GOWN DISPOSABLE) ×4 IMPLANT
GOWN STRL REIN XL XLG (GOWN DISPOSABLE) ×2 IMPLANT
HANDPIECE INTERPULSE COAX TIP (DISPOSABLE)
IMMOBILIZER KNEE 20 (SOFTGOODS) ×4
IMMOBILIZER KNEE 20 THIGH 36 (SOFTGOODS) IMPLANT
KIT BASIN OR (CUSTOM PROCEDURE TRAY) ×2 IMPLANT
MANIFOLD NEPTUNE II (INSTRUMENTS) ×2 IMPLANT
NS IRRIG 1000ML POUR BTL (IV SOLUTION) ×2 IMPLANT
PACK TOTAL JOINT (CUSTOM PROCEDURE TRAY) ×2 IMPLANT
POSITIONER SURGICAL ARM (MISCELLANEOUS) ×2 IMPLANT
SET HNDPC FAN SPRY TIP SCT (DISPOSABLE) ×1 IMPLANT
SPONGE GAUZE 4X4 12PLY (GAUZE/BANDAGES/DRESSINGS) ×2 IMPLANT
STAPLER VISISTAT (STAPLE) ×2 IMPLANT
SUT ETHIBOND NAB CT1 #1 30IN (SUTURE) ×6 IMPLANT
SUT VIC AB 0 CT1 27 (SUTURE) ×4
SUT VIC AB 0 CT1 27XBRD ANTBC (SUTURE) ×2 IMPLANT
SUT VIC AB 1 CT1 27 (SUTURE) ×8
SUT VIC AB 1 CT1 27XBRD ANTBC (SUTURE) ×4 IMPLANT
SUT VIC AB 2-0 CT1 27 (SUTURE) ×4
SUT VIC AB 2-0 CT1 TAPERPNT 27 (SUTURE) ×2 IMPLANT
TOWER CARTRIDGE SMART MIX (DISPOSABLE) ×1 IMPLANT
WATER STERILE IRR 1500ML POUR (IV SOLUTION) ×2 IMPLANT

## 2012-01-28 NOTE — Clinical Documentation Improvement (Signed)
GENERIC DOCUMENTATION CLARIFICATION QUERY  THIS DOCUMENT IS NOT A PERMANENT PART OF THE MEDICAL RECORD  TO RESPOND TO THE THIS QUERY, FOLLOW THE INSTRUCTIONS BELOW:  1. If needed, update documentation for the patient's encounter via the notes activity.  2. Access this query again and click edit on the In [employer].  3. After updating, or not, click F2 to complete all highlighted (required) fields concerning your review. Select "additional documentation in the medical record" OR "no additional documentation provided".  4. Click Sign note button.  5. The deficiency will fall out of your In Basket *Please let us know if you are not able to complete this workflow by phone or e-mail (listed below).  Please update your documentation within the medical record to reflect your response to this query.                                                                                        01/28/12   Dear Dr. Mahala Menghini [facility],  In a better effort to capture your patient's severity of illness, reflect appropriate length of stay and utilization of resources, a review of the patient medical record has revealed the following indicators.    Based on your clinical judgment, please clarify and document in a progress note and/or discharge summary the clinical condition associated with the following supporting information:  In responding to this query please exercise your independent judgment.  The fact that a query is asked, does not imply that any particular answer is desired or expected.   Documentation Clarification Needed  H/P note states the following: "Left from oral neck fracture with fasting admission...."  Please clarify the statement "from oral" neck fracture with fasting admission.... And if appropriate please update documentation in the H/P.          Possible Clinical Conditions?  _______Other Condition__________________ _______Cannot Clinically Determine   Supporting  Information:  Risk Factors:  Signs & Symptoms:  Diagnostics:  Treatment  You may use possible, probable, or suspect with inpatient documentation. possible, probable, suspected diagnoses MUST be documented at the time of discharge  Reviewed:  no additional documentation provided 1/30/2013ljh   Thank You,  Enis Slipper RN, BSN, CCDS Clinical Documentation Specialist [facility] Dept Pager: [phone] / E-mail: [email or web address]  Health Information Management [place]

## 2012-01-28 NOTE — Transfer of Care (Signed)
Immediate Anesthesia Transfer of Care Note  Patient: Vicki Velasquez  Procedure(s) Performed:  ARTHROPLASTY BIPOLAR HIP  Patient Location: PACU  Anesthesia Type: General  Level of Consciousness: awake, alert , oriented and patient cooperative  Airway & Oxygen Therapy: Patient Spontanous Breathing and Patient connected to face mask oxygen  Post-op Assessment: Report given to PACU RN, Post -op Vital signs reviewed and stable and Patient moving all extremities  Post vital signs: Reviewed  Complications: No apparent anesthesia complications

## 2012-01-28 NOTE — Interval H&P Note (Signed)
History and Physical Interval Note:  01/28/2012 3:13 PM  Vicki Velasquez  has presented today for surgery, with the diagnosis of left hip fracture  The various methods of treatment have been discussed with the patient and family. After consideration of risks, benefits and other options for treatment, the patient has consented to  Procedure(s): ARTHROPLASTY BIPOLAR HIP as a surgical intervention .  The patients' history has been reviewed, patient examined, no change in status, stable for surgery.  I have reviewed the patients' chart and labs.  Questions were answered to the patient's satisfaction.     Mckinnon Glick C

## 2012-01-28 NOTE — Clinical Documentation Improvement (Signed)
CHF DOCUMENTATION CLARIFICATION QUERY  THIS DOCUMENT IS NOT A PERMANENT PART OF THE MEDICAL RECORD  TO RESPOND TO THE THIS QUERY, FOLLOW THE INSTRUCTIONS BELOW:  1. If needed, update documentation for the patient's encounter via the notes activity.  2. Access this query again and click edit on the In [employer].  3. After updating, or not, click F2 to complete all highlighted (required) fields concerning your review. Select "additional documentation in the medical record" OR "no additional documentation provided".  4. Click Sign note button.  5. The deficiency will fall out of your In Basket *Please let us know if you are not able to complete this workflow by phone or e-mail (listed below).  Please update your documentation within the medical record to reflect your response to this query.                                                                                    01/28/12  Dear Dr. Mahala Menghini [facility],  In a better effort to capture your patient's severity of illness, reflect appropriate length of stay and utilization of resources, a review of the patient medical record has revealed the following indicators the diagnosis of Heart Failure.    Based on your clinical judgment, please clarify and document in a progress note and/or discharge summary the clinical condition associated with the following supporting information:  In responding to this query please exercise your independent judgment.  The fact that a query is asked, does not imply that any particular answer is desired or expected.   According to H/P pt with CHF. Please clarify the the acuity and type of CHF for this admission necessitating the treatment of and document in pn and  d/c summary.   Best Practice:  Documenting the acuity and type of CHF for each inpatient admission provides accuracy and clarity of the SOI/ROM.   Possible Clinical Conditions?   Acute Systolic Congestive Heart Failure Acute Diastolic  Congestive Heart Failure Acute Systolic & Diastolic Congestive Heart Failure Acute on Chronic Systolic Congestive Heart Failure Acute on Chronic Diastolic Congestive Heart Failure Acute on Chronic Systolic & Diastolic  Congestive Heart Failure Other Condition________________________________________ Cannot Clinically Determine  Supporting Information:  Risk Factors: Femoral neck fx, CHF  Signs & Symptoms:  H/P notes Mild JVD and Cardiomegaly S1-S2 tachycardic   Diagnostics: 01/27/12  CXR: Findings: Mild cardiac enlargement and prominent pulmonary vascularity without edema suggesting early congestive changes. Interstitial fibrosis in the lung bases.  Mild interstitial edema is not excluded.  No focal airspace consolidation.  Calcification of the aorta.   IMPRESSION: Mild congestive changes in the heart and lungs with interstitial fibrosis and possible interstitial edema.  No focal consolidation.    Component Pro B Natriuretic peptide (BNP)  Latest Ref Rng 0 - 450 pg/mL  01/27/2012 2824.0 (H)    Treatment: hydrochlorothiazide  metoprolol  LASIX  Reviewed:  no additional documentation provided 01/29/2012 [facility].  Thank You,  Enis Slipper  RN, BSN, CCDS Clinical Documentation Specialist [facility] Dept Pager: [phone] / E-mail: [email or web address]  Health Information Management [place]

## 2012-01-28 NOTE — Brief Op Note (Signed)
01/27/2012 - 01/28/2012  5:00 PM  PATIENT:  Vicki Velasquez  78 y.o. female  PRE-OPERATIVE DIAGNOSIS:  left hip fracture  POST-OPERATIVE DIAGNOSIS:  left hip fracture  PROCEDURE:  Procedure(s): ARTHROPLASTY BIPOLAR HIP  SURGEON:  Surgeon(s): Javier Docker, MD  PHYSICIAN ASSISTANT:   ASSISTANTS: [place]rad[place]   ANESTHESIA:   general  EBL:  Total I/O In: -  Out: 300 [Urine:300]  BLOOD ADMINISTERED:none  DRAINS: none   LOCAL MEDICATIONS USED:  NONE  SPECIMEN:  No Specimen  DISPOSITION OF SPECIMEN:  N/A  COUNTS:  YES  TOURNIQUET:  * No tourniquets in log *  DICTATION: .Other Dictation: Dictation Number [id number]  PLAN OF CARE: Admit to inpatient   PATIENT DISPOSITION:  PACU - hemodynamically stable.   Delay start of Pharmacological VTE agent (>24hrs) due to surgical blood loss or risk of bleeding:  {YES/NO/NOT APPLICABLE:20182

## 2012-01-28 NOTE — Progress Notes (Signed)
PROGRESS NOTE  Vicki Velasquez ZOX:[id number] DOB: 10-31-1916 DOA: 01/27/2012 PCP: Kimber Relic, MD, MD  Brief narrative: 78 y/o Cf with multiple medical illnesses admitted with Hip Fracture, 1.28.13.  Past medical history: 8 atrial fibrillation, hypothyroidism hypertension, cardiomegaly, indices, porcine AV placement 2002, brain surgery for tumor  Consultants:  Orthopedics-[facility]  Procedures:  January 29-repair of hip fracture  Antibiotics:  1 dose of explored with surgery   Subjective  Confused, not sure she is. Nonanemic pain currently. Does not appear to be in cardiorespiratory distress   Objective    Interim History:] In PACU at time of examination  Objective: Filed Vitals:   01/28/12 0615 01/28/12 0639 01/28/12 1407 01/28/12 1706  BP: 142/110 136/68 158/72 136/104  Pulse: 67  82   Temp: 97.7 F (36.5 C)  99.2 F (37.3 C) 98.4 F (36.9 C)  TempSrc: Oral  Oral   Resp: 16  16 21   Height:      Weight:      SpO2: 99%  97%     Intake/Output Summary (Last 24 hours) at 01/28/12 1736 Last data filed at 01/28/12 1706  Gross per 24 hour  Intake    503 ml  Output    850 ml  Net   -347 ml    Exam:  HEENT-alert oriented slim Caucasian female in some painful distress. Mild JVD. No prior 3. Trachea slightly enlarged. No pallor no icterus throat clear  CHEST-clinically clear no added sounds  CARDS-S1-S2 tachycardic to low 100s in nature fibrillation possible murmur left lower sternal edge  ABD-soft nontender nondistended  SKIN-no lower extremity edema  NEURO- brace on left lower extremity GU-deferred  Data Reviewed: Basic Metabolic Panel:  Lab 01/28/12 8119 01/27/12 0450  NA 139 138  K 3.4* 3.1*  CL 103 101  CO2 26 24  GLUCOSE 123* 145*  BUN 31* 32*  CREATININE 1.17* 1.08  CALCIUM 9.8 9.9  MG -- --  PHOS -- --   Liver Function Tests:  Lab 01/27/12 0450  AST 52*  ALT 42*  ALKPHOS 123*  BILITOT 0.4  PROT 6.7  ALBUMIN 3.8   No results  found for this basename: LIPASE:5,AMYLASE:5 in the last 168 hours No results found for this basename: AMMONIA:5 in the last 168 hours CBC:  Lab 01/28/12 0341 01/27/12 0450  WBC 11.4* 13.2*  NEUTROABS -- 10.8*  HGB 11.3* 11.6*  HCT 33.9* 35.4*  MCV 91.1 91.0  PLT 161 182   Cardiac Enzymes: No results found for this basename: CKTOTAL:5,CKMB:5,CKMBINDEX:5,TROPONINI:5 in the last 168 hours BNP: No components found with this basename: POCBNP:5 CBG:  Lab 01/28/12 0834  GLUCAP 139*    Recent Results (from the past 240 hour(s))  URINE CULTURE     Status: Normal   Collection Time   01/27/12  4:55 AM      Component Value Range Status Comment   Specimen Description URINE, CATHETERIZED   Final    Special Requests NONE   Final    Culture  Setup Time [id number]   Final    Colony Count NO GROWTH   Final    Culture NO GROWTH   Final    Report Status 01/28/2012 FINAL   Final   SURGICAL PCR SCREEN     Status: Normal   Collection Time   01/27/12  9:32 PM      Component Value Range Status Comment   MRSA, PCR NEGATIVE  NEGATIVE  Final    Staphylococcus aureus NEGATIVE  NEGATIVE  Final      Studies: Dg Chest 1 View  01/27/2012  *RADIOLOGY REPORT*  Clinical Data: Left hip fracture.  Hypertension and A fib.  CHEST - 1 VIEW  Comparison: 10/02/2011  Findings: Stable postoperative changes in the mediastinum.  Shallow inspiration.  Mild cardiac enlargement and prominent pulmonary vascularity without edema suggesting early congestive changes. Interstitial fibrosis in the lung bases.  Mild interstitial edema is not excluded.  No focal airspace consolidation.  Calcification of the aorta.  IMPRESSION: Mild congestive changes in the heart and lungs with interstitial fibrosis and possible interstitial edema.  No focal consolidation.  Original Report Authenticated By: Marlon Pel, M.D.   Dg Hip Complete Left  01/27/2012  *RADIOLOGY REPORT*  Clinical Data: Left hip pain after fall  LEFT HIP -  COMPLETE 2+ VIEW  Comparison: None.  Findings: A transverse subcapital fracture of the left femoral neck with varus angulation.  Degenerative changes in the lower lumbar spine and hips.  Calcifications in the pelvis consistent with phleboliths.  Vascular calcifications.  Pelvis appears intact.  IMPRESSION: Transverse fracture of the left femoral neck with varus angulation.  Original Report Authenticated By: Marlon Pel, M.D.    Scheduled Meds:   . aspirin EC  81 mg Oral Daily  .  ceFAZolin (ANCEF) IV  1 g Intravenous Once  . famotidine  20 mg Oral Daily  . furosemide  40 mg Oral Daily  . heparin  5,000 Units Subcutaneous Q8H  . levothyroxine  75 mcg Oral Daily  . lip balm      . metoprolol succinate  50 mg Oral Daily  . morphine  1 mg Intravenous NOW  . potassium chloride  10 mEq Oral Daily  . senna-docusate  1 tablet Oral Daily  . sodium chloride  3 mL Intravenous Q12H  . DISCONTD:  ceFAZolin (ANCEF) IV  1 g Intravenous Once   Continuous Infusions:    Assessment/Plan: 1. Status post hip repair 1.29-per orthopedist appreciate input-could consider short-term Coumadin/xarelto if needed for DVT prophylaxis 2. CHF-ejection fraction undefined. Resume Lasix in the morning once daily 3. atrial fibrillation on Coumadin-stable. 10 metoprolol 50 daily and aspirin. Can consider anticoagulation as per #1 4. Hypothyroidism-continue Synthroid 5. Valve replacement-continue above medications 6.  chronic lung disease-keep on 02 and try to wean in a.m. Check stat  Code Status: Full Family Communication:  Disposition Plan: PT/OT eval and then dispo   Pleas Koch, MD  [facility] Pager [phone] 01/28/2012, 5:36 PM    LOS: 1 day

## 2012-01-28 NOTE — Anesthesia Preprocedure Evaluation (Addendum)
Anesthesia Evaluation  Patient identified by MRN, date of birth, ID band Patient awake    Reviewed: Allergy & Precautions, H&P , NPO status , Patient's Chart, lab work & pertinent test results  Airway Mallampati: II TM Distance: >3 FB Neck ROM: Full    Dental No notable dental hx.    Pulmonary neg pulmonary ROS,  clear to auscultation  Pulmonary exam normal       Cardiovascular hypertension, neg cardio ROS + dysrhythmias Atrial Fibrillation Valvular problems/murmurs: s/p AVR porcine. Regular Normal    Neuro/Psych Negative Neurological ROS  Negative Psych ROS   GI/Hepatic negative GI ROS, Neg liver ROS,   Endo/Other  Negative Endocrine ROSHypothyroidism   Renal/GU negative Renal ROS  Genitourinary negative   Musculoskeletal negative musculoskeletal ROS (+)   Abdominal   Peds negative pediatric ROS (+)  Hematology negative hematology ROS (+)   Anesthesia Other Findings   Reproductive/Obstetrics negative OB ROS                         Anesthesia Physical Anesthesia Plan  ASA: III  Anesthesia Plan: General   Post-op Pain Management:    Induction: Intravenous  Airway Management Planned: Oral ETT  Additional Equipment:   Intra-op Plan:   Post-operative Plan: Extubation in OR  Informed Consent: I have reviewed the patients History and Physical, chart, labs and discussed the procedure including the risks, benefits and alternatives for the proposed anesthesia with the patient or authorized representative who has indicated his/her understanding and acceptance.   Dental advisory given  Plan Discussed with: CRNA  Anesthesia Plan Comments:        Anesthesia Quick Evaluation

## 2012-01-28 NOTE — Anesthesia Postprocedure Evaluation (Signed)
  Anesthesia Post-op Note  Patient: Vicki Velasquez  Procedure(s) Performed:  ARTHROPLASTY BIPOLAR HIP  Patient Location: PACU  Anesthesia Type: General  Level of Consciousness: awake and alert   Airway and Oxygen Therapy: Patient Spontanous Breathing  Post-op Pain: mild  Post-op Assessment: Post-op Vital signs reviewed, Patient's Cardiovascular Status Stable, Respiratory Function Stable, Patent Airway and No signs of Nausea or vomiting  Post-op Vital Signs: stable  Complications: No apparent anesthesia complications

## 2012-01-28 NOTE — Preoperative (Signed)
Beta Blockers   Reason not to administer Beta Blockers:toprol 50 mg taken at 1000 on 01-28-12

## 2012-01-29 LAB — BASIC METABOLIC PANEL
BUN: 30 mg/dL — ABNORMAL HIGH (ref 6–23)
Chloride: 105 mEq/L (ref 96–112)
Creatinine, Ser: 1.01 mg/dL (ref 0.50–1.10)
Glucose, Bld: 122 mg/dL — ABNORMAL HIGH (ref 70–99)
Potassium: 3.2 mEq/L — ABNORMAL LOW (ref 3.5–5.1)

## 2012-01-29 LAB — CBC
HCT: 29.7 % — ABNORMAL LOW (ref 36.0–46.0)
Hemoglobin: 9.7 g/dL — ABNORMAL LOW (ref 12.0–15.0)
MCV: 91.4 fL (ref 78.0–100.0)
WBC: 9.6 10*3/uL (ref 4.0–10.5)

## 2012-01-29 MED ORDER — OXYCODONE HCL 5 MG PO TABS
5.0000 mg | ORAL_TABLET | ORAL | Status: DC | PRN
Start: 2012-01-29 — End: 2012-02-03
  Administered 2012-01-29 – 2012-02-03 (×15): 5 mg via ORAL
  Filled 2012-01-29 (×17): qty 1

## 2012-01-29 MED ORDER — RIVAROXABAN 10 MG PO TABS
10.0000 mg | ORAL_TABLET | Freq: Every day | ORAL | Status: DC
  Administered 2012-01-29 – 2012-02-03 (×6): 10 mg via ORAL
  Filled 2012-01-29 (×6): qty 1

## 2012-01-29 MED ORDER — FERROUS SULFATE 325 (65 FE) MG PO TABS
325.0000 mg | ORAL_TABLET | Freq: Every day | ORAL | Status: DC
  Administered 2012-01-30 – 2012-02-03 (×5): 325 mg via ORAL
  Filled 2012-01-29 (×5): qty 1

## 2012-01-29 NOTE — Op Note (Signed)
NAMEMarland Kitchen  NAKKIA, MACKIEWICZ NO.:  [id number]  MEDICAL RECORD NO.:  [id number]  LOCATION:  1611                         FACILITY:  [facility]  PHYSICIAN:  Jene Every, M.D.    DATE OF BIRTH:  08-21-1916  DATE OF PROCEDURE:  01/28/2012 DATE OF DISCHARGE:                              OPERATIVE REPORT   PREOPERATIVE DIAGNOSIS:  Left femoral neck fracture.  POSTOPERATIVE DIAGNOSIS:  Left femoral neck fracture.  PROCEDURE PERFORMED:  Left hip hemiarthroplasty.  ANESTHESIA:  General.  ASSISTANT:  Roma Schanz, PA.  COMPONENTS:  DePuy Prodigy 12 +1.5 neck, 45 bipolar.  BRIEF HISTORY:  A 78 year old sustained a fracture, displaced, indicated for placement of the nonviable femoral head.  Risks and benefits discussed including bleeding, infection,  DICTATION ENDED AT THIS POINT     Jene Every, M.D.     JB/MEDQ  D:  01/28/2012  T:  01/29/2012  Job:  [id number]

## 2012-01-29 NOTE — Op Note (Signed)
NAMEMarland Kitchen  ADYSEN, RAPHAEL NO.:  [id number]  MEDICAL RECORD NO.:  [id number]  LOCATION:  1611                         FACILITY:  [facility]  PHYSICIAN:  Jene Every, M.D.    DATE OF BIRTH:  1916/09/13  DATE OF PROCEDURE:  01/28/2012 DATE OF DISCHARGE:                              OPERATIVE REPORT   PREOPERATIVE DIAGNOSIS:  Left femoral neck fracture.  POSTOPERATIVE DIAGNOSIS:  Left femoral neck fracture.  PROCEDURE PERFORMED:  Left hip hemiarthroplasty.  ANESTHESIA:  General.  ASSISTANT:  Roma Schanz, P.A.  COMPONENTS:  DePuy 12 stem, 1.5 neck, 45 bipolar.  HISTORY:  A 79 year old with fracture, indicated for replacement of nonviable head.  Risk and benefits discussed including bleeding, infection, damage to neurovascular structures, DVT, PE, dislocation, anesthetic complications, etc.  TECHNIQUE:  The patient in supine position, after induction of adequate general anesthesia, 1 g of Kefzol, placed in the right lateral decubitus position.  All bony prominences well padded.  A hip holder utilized. Well leg gently flexed.  Foley to gravity, axillary roll.  Left peritrochanteric region was prepped and draped in usual sterile fashion as was the thigh with the knee flexed and abducted.  I made incision over the greater trochanter and proximal to it approximately 5 cm. Adductor tenotomy was performed.  External rotators identified, tagged, and reflected posteriorly.  Capsule was identified and was attenuated, hemorrhagic, a T-shaped capsulotomy was performed.  The leaflets were tagged, although it was very little to save.  The fracture was identified and the hip was dislocated without difficulty.  Fracture was identified nonpathologic 1 fingerbreadth above the lesser trochanter. Oscillating saw was utilized to perform an osteotomy.  I used cobra for soft tissue protection.  We reflected the piriformis posterior and protected the sciatic nerve throughout the  case.  Next, we entered the canal with the initiator and then a T-handle reamer.  We reamed to a 12 mm with good cortical purchase. Then, I used a trial 10 and then 12 satisfactorily and removed this and then I removed the femoral head measured at 45.  Checked the acetabulum, there was no bleeding significant arthrosis and trialed the 45 that was satisfactory.  I then impacted the Prodigy in appropriate version to seating on the osteotomy without difficulty.  I then impacted a +1.5, 45 bipolar assembly after cleaning the trunnion.  The patient was relaxed and this was reduced without difficulty.  It was stable throughout full range of motion, with equivalent leg lengths.  Very little of the capsule remained.  I repaired a portion of the capsule with #1 Ethibond interrupted figure-of- 8 sutures.  We had good range.  Wound copiously irrigated.  Repaired the adductor tenotomy with 1 Vicryl interrupted figure-of-8 sutures.  The fascia lata with #1 Vicryl interrupted figure-of-8 sutures, subcutaneous with 0 and 2-0 Vicryl simple sutures, this was fairly attenuated, adductor, and subcutaneous tissue.  The skin was re-approximated with staples.  Wound was dressed sterilely.  Leg lengths were equivalent.  No active bleeding.  The skin was re-approximated with staples.  The wound was dressed sterilely.  She was placed supine on the hospital bed.  Knee immobilizer placed.  Leg lengths were equivalent as  was the rotation, good pulses.  Extubated without difficulty and transported to Recovery in satisfactory condition.  The patient tolerated the procedure well.  No complications.  Assistant is Roma Schanz, [place].  Blood loss was 50 cc.     Jene Every, M.D.     Cordelia Pen  D:  01/28/2012  T:  01/29/2012  Job:  [id number]

## 2012-01-29 NOTE — Progress Notes (Signed)
Physical Therapy Treatment Patient Details Name: Vicki Velasquez MRN: [id number] DOB: Dec 24, 1916 Today's Date: 01/29/2012  PT Assessment/Plan  PT - Assessment/Plan Comments on Treatment Session: More pain this pm with meds due.  Will need pre med at next session PT Plan: Discharge plan remains appropriate PT Frequency: Min 3X/week Follow Up Recommendations: Skilled nursing facility Equipment Recommended: Defer to next venue PT Goals  Acute Rehab PT Goals PT Goal Formulation: With patient Time For Goal Achievement: 2 weeks Pt will go Supine/Side to Sit: with mod assist PT Goal: Supine/Side to Sit - Progress: Goal set today Pt will go Sit to Stand: with mod assist PT Goal: Sit to Stand - Progress: Goal set today Pt will go Stand to Sit: with mod assist PT Goal: Stand to Sit - Progress: Goal set today Pt will Transfer Bed to Chair/Chair to Bed: with mod assist PT Transfer Goal: Bed to Chair/Chair to Bed - Progress: Goal set today Pt will Perform Home Exercise Program: with min assist PT Goal: Perform Home Exercise Program - Progress: Goal set today  PT Treatment Precautions/Restrictions  Precautions Precautions: Posterior Hip Restrictions Weight Bearing Restrictions: Yes LLE Weight Bearing: Partial weight bearing LLE Partial Weight Bearing Percentage or Pounds: 25-50% Mobility (including Balance) Bed Mobility Bed Mobility: Yes Supine to Sit: 2: Max assist Supine to Sit Details (indicate cue type and reason): asssist to scoot over in bed and lower legs off bed and lift trunk to sit upright Sitting - Scoot to Edge of Bed: 2: Max assist Sitting - Scoot to [employer] of Bed Details (indicate cue type and reason): scooting forward on pad under patient Sit to Supine: 1: +2 Total assist Sit to Supine - Details (indicate cue type and reason): pt=25%  (assisted to scoot over in bed after supine with mod assist) Transfers Transfers: Yes Sit to Stand: From chair/3-in-1;With upper extremity  assist;1: +2 Total assist Sit to Stand Details (indicate cue type and reason): pt=35% Stand to Sit: To bed;1: +2 Total assist Stand to Sit Details: pt=30% Stand Pivot Transfers: 1: +2 Total assist Stand Pivot Transfer Details (indicate cue type and reason): pt=30% with walker Ambulation/Gait Ambulation/Gait: No    Exercise   End of Session PT - End of Session Equipment Utilized During Treatment: Gait belt Activity Tolerance: Patient limited by pain Patient left: in bed;with call bell in reach;with family/visitor present General Behavior During Session: [facility] for tasks performed Cognition: [facility] for tasks performed  [facility] 01/29/2012, 3:33 PM

## 2012-01-29 NOTE — Progress Notes (Signed)
PROGRESS NOTE  Vicki Velasquez:[id number] DOB: [birth year] DOA: 01/27/2012 PCP: Kimber Relic, MD, MD  Brief narrative: 78 y/o Cf with multiple medical illnesses admitted with Hip Fracture, 1.28.13.  Past medical history:  atrial fibrillation, hypothyroidism hypertension, cardiomegaly, indices, porcine AV placement 2002, brain surgery for tumor  Consultants:  Orthopedics-[facility]  Procedures:  January 29-repair of hip fracture  Antibiotics:  1 dose of explored with surgery   Subjective  Much less confused, not sure she is. Mild pain currently-not using much opiates. Does not appear to be in cardiorespiratory distress.  NO CP, no blurred vision, no double vision, no LE edema, no SOB or cough.  NO stool yet.   Objective   Interim History: Seen by Ortho/[facility]--[facility] rec's SNF    Objective: Filed Vitals:   01/29/12 0536 01/29/12 0610 01/29/12 0934 01/29/12 1440  BP: 144/72  145/78 141/81  Pulse: 79  83 87  Temp: 98.2 F (36.8 C)  97.6 F (36.4 C) 98.2 F (36.8 C)  TempSrc: Oral  Oral Oral  Resp: 16  16 16   Height:      Weight:  63.6 kg (140 lb 3.4 oz)    SpO2: 100%  99% 97%    Intake/Output Summary (Last 24 hours) at 01/29/12 1521 Last data filed at 01/29/12 0935  Gross per 24 hour  Intake 1818.33 ml  Output    600 ml  Net 1218.33 ml    Exam:  HEENT-alert oriented slim Caucasian female in some painful distress. No JVD.  Trachea slightly enlarged. No pallor no icterus throat clear  CHEST-clinically clear no added sounds  CARDS-S1-S2 tachycardic to low 100s in nature fibrillation possible murmur left lower sternal edge  ABD-soft nontender nondistended  SKIN-no lower extremity edema  NEURO- brace on left lower extremity, Wound appears covered but no blood noted on dressing GU-deferred  Data Reviewed: Basic Metabolic Panel:  Lab 01/29/12 8119 01/28/12 0341 01/27/12 0450  NA 140 139 138  K 3.2* 3.4* --  CL 105 103 101  CO2 27 26 24   GLUCOSE  122* 123* 145*  BUN 30* 31* 32*  CREATININE 1.01 1.17* 1.08  CALCIUM 9.2 9.8 9.9  MG -- -- --  PHOS -- -- --   Liver Function Tests:  Lab 01/27/12 0450  AST 52*  ALT 42*  ALKPHOS 123*  BILITOT 0.4  PROT 6.7  ALBUMIN 3.8   No results found for this basename: LIPASE:5,AMYLASE:5 in the last 168 hours No results found for this basename: AMMONIA:5 in the last 168 hours CBC:  Lab 01/29/12 0354 01/28/12 0341 01/27/12 0450  WBC 9.6 11.4* 13.2*  NEUTROABS -- -- 10.8*  HGB 9.7* 11.3* 11.6*  HCT 29.7* 33.9* 35.4*  MCV 91.4 91.1 91.0  PLT 136* 161 182   Cardiac Enzymes: No results found for this basename: CKTOTAL:5,CKMB:5,CKMBINDEX:5,TROPONINI:5 in the last 168 hours BNP: No components found with this basename: POCBNP:5 CBG:  Lab 01/28/12 0834  GLUCAP 139*    Recent Results (from the past 240 hour(s))  URINE CULTURE     Status: Normal   Collection Time   01/27/12  4:55 AM      Component Value Range Status Comment   Specimen Description URINE, CATHETERIZED   Final    Special Requests NONE   Final    Culture  Setup Time [id number]   Final    Colony Count NO GROWTH   Final    Culture NO GROWTH   Final    Report Status 01/28/2012 FINAL  Final   SURGICAL PCR SCREEN     Status: Normal   Collection Time   01/27/12  9:32 PM      Component Value Range Status Comment   MRSA, PCR NEGATIVE  NEGATIVE  Final    Staphylococcus aureus NEGATIVE  NEGATIVE  Final      Studies: Dg Chest 1 View  01/27/2012  *RADIOLOGY REPORT*  Clinical Data: Left hip fracture.  Hypertension and A fib.  CHEST - 1 VIEW  Comparison: 10/02/2011  Findings: Stable postoperative changes in the mediastinum.  Shallow inspiration.  Mild cardiac enlargement and prominent pulmonary vascularity without edema suggesting early congestive changes. Interstitial fibrosis in the lung bases.  Mild interstitial edema is not excluded.  No focal airspace consolidation.  Calcification of the aorta.  IMPRESSION: Mild congestive  changes in the heart and lungs with interstitial fibrosis and possible interstitial edema.  No focal consolidation.  Original Report Authenticated By: Marlon Pel, M.D.   Dg Hip Complete Left  01/27/2012  *RADIOLOGY REPORT*  Clinical Data: Left hip pain after fall  LEFT HIP - COMPLETE 2+ VIEW  Comparison: None.  Findings: A transverse subcapital fracture of the left femoral neck with varus angulation.  Degenerative changes in the lower lumbar spine and hips.  Calcifications in the pelvis consistent with phleboliths.  Vascular calcifications.  Pelvis appears intact.  IMPRESSION: Transverse fracture of the left femoral neck with varus angulation.  Original Report Authenticated By: Marlon Pel, M.D.    Scheduled Meds:    .  ceFAZolin (ANCEF) IV  1 g Intravenous Q8H  . famotidine  20 mg Oral Daily  . furosemide  20 mg Oral Daily  . levothyroxine  75 mcg Oral Daily  . metoprolol succinate  50 mg Oral Daily  . potassium chloride  10 mEq Oral Daily  . rivaroxaban  10 mg Oral Daily  . senna-docusate  1 tablet Oral Daily  . DISCONTD: aspirin EC  81 mg Oral Daily  . DISCONTD: aspirin EC  81 mg Oral Daily  . DISCONTD:  ceFAZolin (ANCEF) IV  1 g Intravenous Once  . DISCONTD:  ceFAZolin (ANCEF) IV  1 g Intravenous Q6H  . DISCONTD: furosemide  40 mg Oral Daily  . DISCONTD: sodium chloride  3 mL Intravenous Q12H   Continuous Infusions:    . sodium chloride 100 mL/hr at 01/29/12 0815  . DISCONTD: lactated ringers    . DISCONTD: lactated ringers       Assessment/Plan: 1. Status post hip repair 1.29-per orthopedist appreciate input-Wean oral pain meds. Prefer no Muscle relaxants at this age group.  SW aware need for SNF 2. CHF-ejection fraction undefined.  Resume Lasix in the morning once daily. D/c Peri-op IVF. 3. Atrial fibrillation on Coumadin-stable. 10 metoprolol 50 daily Xarelto for 3 wks and then ASA after  4. Hypothyroidism-continue Synthroid 5. Valve replacement-continue  above medications. 6. Chronic lung disease-keep on 02 and try to wean in a.m. Check stat.  Ambulates when possible 7. Anemia-Check CBC in am.  Only consider transfusion if drops below Hb 8.0.  Add Iron.  Code Status: Full Family Communication: SPoke with Niece and Family in room.  All questions answered Disposition Plan: PT/OT eval and then dispo   Pleas Koch, MD  [facility] Pager [phone] 01/29/2012, 3:21 PM    LOS: 2 days

## 2012-01-29 NOTE — Evaluation (Signed)
Physical Therapy Evaluation Patient Details Name: Vicki Velasquez MRN: [id number] DOB: 1916/06/26 Today's Date: 01/29/2012  Problem List:  Patient Active Problem List  Diagnoses  . S/P aortic valve replacement  . Atrial fibrillation  . Dyspnea  . HTN (hypertension)    Past Medical History:  Past Medical History  Diagnosis Date  . Atrial flutter   . Hypothyroidism   . Hypertension   . Edema     Leg  . Atrial fibrillation     not on coumadin-managed on meciaitons.  . SOB (shortness of breath) 12/16/10    At rest-no oxygen at rest  . Chronic lung disease     Stable  . Factitious cardiomegaly     Stable  . Disc disease, degenerative, lumbar or lumbosacral     Dr. Dyane Dustman Medicine-Last Steroids 5 yr ago   Past Surgical History:  Past Surgical History  Procedure Date  . Aortic valve replacement     2002 replaced with a pig vavle.    . Tumour     had surgery ion the 70's for it     PT Assessment/Plan/Recommendation PT Assessment Clinical Impression Statement: Patient s/p fall with left hip fracture now with limited tolerance to mobility with pain, decreased AROM/strength left leg and will benefit from skilled PT in acute setting to maximize independence and allow d/c home after SNF stay. PT Recommendation/Assessment: Patient will need skilled PT in the acute care venue PT Problem List: Decreased range of motion;Decreased strength;Decreased activity tolerance;Decreased mobility;Pain PT Therapy Diagnosis : Difficulty walking;Acute pain PT Plan PT Frequency: Min 3X/week PT Treatment/Interventions: DME instruction;Gait training;Functional mobility training;Therapeutic activities;Therapeutic exercise;Patient/family education PT Recommendation Follow Up Recommendations: Skilled nursing facility Equipment Recommended: Defer to next venue PT Goals  Acute Rehab PT Goals PT Goal Formulation: With patient Time For Goal Achievement: 2 weeks Pt will go Supine/Side to Sit:  with mod assist PT Goal: Supine/Side to Sit - Progress: Goal set today Pt will go Sit to Stand: with mod assist PT Goal: Sit to Stand - Progress: Goal set today Pt will go Stand to Sit: with mod assist PT Goal: Stand to Sit - Progress: Goal set today Pt will Transfer Bed to Chair/Chair to Bed: with mod assist PT Transfer Goal: Bed to Chair/Chair to Bed - Progress: Goal set today Pt will Perform Home Exercise Program: with min assist PT Goal: Perform Home Exercise Program - Progress: Goal set today  PT Evaluation Precautions/Restrictions  Precautions Precautions: Posterior Hip Restrictions LLE Weight Bearing: Partial weight bearing LLE Partial Weight Bearing Percentage or Pounds: 25-50% Prior Functioning  Home Living Lives With: Alone Type of Home: House Home Layout: One level Home Access: Stairs to enter Entrance Stairs-Rails: Doctor, general practice of Steps: 3 Bathroom Shower/Tub: Fish farm manager Equipment: Grab bars in shower;Grab bars around toilet;Walker - four wheeled;Tub transfer bench Prior Function Level of Independence: Independent with basic ADLs;Independent with transfers;Requires assistive device for independence;Independent with gait;Independent with homemaking with ambulation Driving: No Cognition Cognition Arousal/Alertness: Awake/alert Overall Cognitive Status: Appears within functional limits for tasks assessed Sensation/Coordination   Extremity Assessment RLE Assessment RLE Assessment: Within Functional Limits LLE Assessment LLE Assessment: Exceptions to WFL LLE AROM (degrees) Overall AROM Left Lower Extremity: Due to pain;Deficits LLE Strength LLE Overall Strength: Deficits;Due to pain Mobility (including Balance) Bed Mobility Bed Mobility: Yes Supine to Sit: 2: Max assist Supine to Sit Details (indicate cue type and reason): asssist to scoot over in bed and lower legs off bed and lift trunk to  sit upright Sitting - Scoot  to Edge of Bed: 2: Max assist Sitting - Scoot to [employer] of Bed Details (indicate cue type and reason): scooting forward on pad under patient Transfers Transfers: Yes Sit to Stand: 2: Max assist;From bed;With upper extremity assist Sit to Stand Details (indicate cue type and reason): cues for PWB left and push up from bed, increased time to come upright with assist at hips and shoulders Stand to Sit: 1: +2 Total assist;To chair/3-in-1;With upper extremity assist Stand to Sit Details: cues for hand placment Stand Pivot Transfers: 1: +2 Total assist Stand Pivot Transfer Details (indicate cue type and reason): pt=30% difficulty moving right foot due to pain with weight left foot. (attempted with +1 assist, unable, then with +2 assist with rolling walker Ambulation/Gait Ambulation/Gait: No    Exercise  Total Joint Exercises Ankle Circles/Pumps: AROM;Left;10 reps;Supine Quad Sets: AROM;Left;10 reps;Supine Heel Slides: AAROM;Left;10 reps;Supine End of Session PT - End of Session Equipment Utilized During Treatment: Gait belt Activity Tolerance: Patient limited by pain;Patient limited by fatigue Patient left: in chair;with call bell in reach;with family/visitor present General Behavior During Session: [facility] for tasks performed Cognition: [facility] for tasks performed  [facility] 01/29/2012, 3:20 PM

## 2012-01-29 NOTE — Progress Notes (Addendum)
Report from Deputy, [place]. Introduced self to pt and family. Dsg to hip d/i w/ bruising to area. Ice applied. Pt reports pain improved since pain meds given earlier. Offers no c/o at present. "I would like to take a little nap."  Foley w/ amber urine. O2 sat on RA=87% even after deep breathing. Placed back on 2lnc and sat back up to 94%. Dr Mahala Menghini paged and made aware.

## 2012-01-29 NOTE — Progress Notes (Signed)
Subjective: 1 Day Post-Op Procedure(s) (LRB): ARTHROPLASTY BIPOLAR HIP (Left) Patient reports pain as 5 on 0-10 scale.   Denies CP or SOB. Positive flatus. Patient having pain in hip as expected otherwise doing ok. Objective: Vital signs in last 24 hours: Temp:  [97.4 F (36.3 C)-99.2 F (37.3 C)] 98.2 F (36.8 C) (01/29 0536) Pulse Rate:  [70-85] 79  (01/29 0536) Resp:  [15-23] 16  (01/29 0536) BP: (102-158)/(57-104) 144/72 mmHg (01/29 0536) SpO2:  [97 %-100 %] 100 % (01/29 0536) Weight:  [63.6 kg (140 lb 3.4 oz)] 63.6 kg (140 lb 3.4 oz) (01/29 0610)  Intake/Output from previous day: 01/28 0701 - 01/29 0700 In: 1578.3 [P.O.:420; I.V.:1088.3; IV Piggyback:50] Out: 800 [Urine:650; Blood:150] Intake/Output this shift:     Basename 01/29/12 0354 01/28/12 0341 01/27/12 0450  HGB 9.7* 11.3* 11.6*    Basename 01/29/12 0354 01/28/12 0341  WBC 9.6 11.4*  RBC 3.25* 3.72*  HCT 29.7* 33.9*  PLT 136* 161    Basename 01/29/12 0354 01/28/12 0341  NA 140 139  K 3.2* 3.4*  CL 105 103  CO2 27 26  BUN 30* 31*  CREATININE 1.01 1.17*  GLUCOSE 122* 123*  CALCIUM 9.2 9.8   No results found for this basename: LABPT:2,INR:2 in the last 72 hours  Neurologically intact Neurovascular intact Intact pulses distally Dorsiflexion/Plantar flexion intact Compartment soft Pt has some decreased sensation in foot but this was there prior to surgery and fall  Assessment/Plan: 1 Day Post-Op Procedure(s) (LRB): ARTHROPLASTY BIPOLAR HIP (Left) Advance diet Up with therapy Dressing change in am SNF Xarelto for DVT prevention for 3 weeks then ASA   Yorley Buch R. 01/29/2012, 8:18 AM

## 2012-01-30 DIAGNOSIS — E860 Dehydration: Secondary | ICD-10-CM | POA: Diagnosis present

## 2012-01-30 DIAGNOSIS — S72002A Fracture of unspecified part of neck of left femur, initial encounter for closed fracture: Secondary | ICD-10-CM | POA: Diagnosis present

## 2012-01-30 LAB — CBC
HCT: 26.8 % — ABNORMAL LOW (ref 36.0–46.0)
Hemoglobin: 8.7 g/dL — ABNORMAL LOW (ref 12.0–15.0)
MCH: 29.5 pg (ref 26.0–34.0)
MCV: 90.8 fL (ref 78.0–100.0)
RBC: 2.95 MIL/uL — ABNORMAL LOW (ref 3.87–5.11)

## 2012-01-30 MED ORDER — POLYETHYLENE GLYCOL 3350 17 G PO PACK: 17.0000 g | PACK | Freq: Every day | ORAL | Status: AC | PRN

## 2012-01-30 MED ORDER — POTASSIUM CHLORIDE CRYS ER 20 MEQ PO TBCR
40.0000 meq | EXTENDED_RELEASE_TABLET | Freq: Once | ORAL | Status: AC
  Administered 2012-01-30: 40 meq via ORAL
  Filled 2012-01-30: qty 2

## 2012-01-30 MED ORDER — RIVAROXABAN 10 MG PO TABS: 10.0000 mg | ORAL_TABLET | Freq: Every day | ORAL | Status: DC

## 2012-01-30 MED ORDER — POTASSIUM CHLORIDE CRYS ER 20 MEQ PO TBCR
20.0000 meq | EXTENDED_RELEASE_TABLET | Freq: Every day | ORAL | Status: DC
  Administered 2012-01-31 – 2012-02-03 (×4): 20 meq via ORAL
  Filled 2012-01-30 (×4): qty 1

## 2012-01-30 MED ORDER — FERROUS SULFATE 325 (65 FE) MG PO TABS: 325.0000 mg | ORAL_TABLET | Freq: Every day | ORAL | Status: DC

## 2012-01-30 NOTE — Progress Notes (Signed)
Physical Therapy Treatment Patient Details Name: Vicki Velasquez MRN: [id number] DOB: 05/14/1916 Today's Date: 01/30/2012  L hemiarthroplasty 2nd fall/fx POD#2 pm session 14:25 - 14:35 1 ta  PT Assessment/Plan  PT - Assessment/Plan Comments on Treatment Session: Pt did well OOB in recliner for several hours and x2 PT sessions.  Pt plans to D/C to SNF. PT Plan: Discharge plan remains appropriate Follow Up Recommendations: Skilled nursing facility Equipment Recommended: Defer to next venue PT Goals  Acute Rehab PT Goals PT Goal Formulation: With patient Pt will go Supine/Side to Sit: with mod assist PT Goal: Supine/Side to Sit - Progress: Progressing toward goal Pt will go Sit to Stand: with mod assist PT Goal: Sit to Stand - Progress: Progressing toward goal Pt will go Stand to Sit: with mod assist PT Goal: Stand to Sit - Progress: Progressing toward goal Pt will Transfer Bed to Chair/Chair to Bed: with mod assist PT Transfer Goal: Bed to Chair/Chair to Bed - Progress: Progressing toward goal Pt will Perform Home Exercise Program: with min assist PT Goal: Perform Home Exercise Program - Progress: Not met  PT Treatment Precautions/Restrictions  Precautions Precautions: Posterior Hip Precaution Comments: Pt instructed on THP as pt recalled only 1/3 Required Braces or Orthoses: No Restrictions Weight Bearing Restrictions: Yes LLE Weight Bearing: Partial weight bearing LLE Partial Weight Bearing Percentage or Pounds: Pt instructed on PWB Mobility (including Balance) Bed Mobility Bed Mobility: Yes Supine to Sit: 1: +2 Total assist Supine to Sit Details (indicate cue type and reason): Total assist + 2  pt 15% with increased time, HOB increased 45' and pivot using pad Sit to Supine: 1: +2 Total assist Sit to Supine - Details (indicate cue type and reason): Total assist +2 pt < 25%  Transfers Transfers: Yes Sit to Stand: 1: +2 Total assist;From chair/3-in-1 Sit to Stand  Details (indicate cue type and reason): Total assist +2 pt<,25% with 75% VC's on proper tech Stand to Sit: 1: +2 Total assist;To bed Stand to Sit Details: Total assist +2 pt  <25% Ambulation/Gait Ambulation/Gait: Yes Ambulation/Gait Assistance: 1: +2 Total assist Ambulation/Gait Assistance Details (indicate cue type and reason): a few steps from chair to the bed Total assist + 2 pt < 25% Ambulation Distance (Feet): 2 Feet Assistive device: Rolling walker Gait Pattern: Step-to pattern;Decreased stance time - left;Trunk flexed Gait velocity: great difficulty weight shifting and advancing eith LE Stairs: No Wheelchair Mobility Wheelchair Mobility: No    Exercise    End of Session PT - End of Session Equipment Utilized During Treatment: Gait belt Activity Tolerance: Patient limited by fatigue;Patient limited by pain Patient left: in bed;with call bell in reach Nurse Communication: Mobility status for transfers General Behavior During Session: [facility] for tasks performed Cognition: [facility] for tasks performed  Felecia Shelling  PTA [facility]  Acute  Rehab Pager     [phone]

## 2012-01-30 NOTE — Evaluation (Signed)
Occupational Therapy Evaluation Patient Details Name: Vicki Velasquez MRN: [id number] DOB: June 24, 1916 Today's Date: 01/30/2012 Time in: 9:48 am Time out: 10:12 am Eval II  Problem List:  Patient Active Problem List  Diagnoses  . S/P aortic valve replacement  . Atrial fibrillation  . Dyspnea  . HTN (hypertension)    Past Medical History:  Past Medical History  Diagnosis Date  . Atrial flutter   . Hypothyroidism   . Hypertension   . Edema     Leg  . Atrial fibrillation     not on coumadin-managed on meciaitons.  . SOB (shortness of breath) 12/16/10    At rest-no oxygen at rest  . Chronic lung disease     Stable  . Factitious cardiomegaly     Stable  . Disc disease, degenerative, lumbar or lumbosacral     Dr. Dyane Dustman [facility]-Last Steroids 5 yr ago   Past Surgical History:  Past Surgical History  Procedure Date  . Aortic valve replacement     2002 replaced with a pig vavle.    . Tumour     had surgery ion the 70's for it     OT Assessment/Plan/Recommendation OT Assessment Clinical Impression Statement: Pt seen with PT for up to the chair only. Limited by pain and fatigue. Began education on THPs and ADL. Will need more reinforcement and practice as tolerating functional mobility better. OT Recommendation/Assessment: Patient will need skilled OT in the acute care venue OT Problem List: Decreased strength;Decreased knowledge of use of DME or AE;Decreased knowledge of precautions;Pain;Decreased activity tolerance OT Plan OT Frequency: Min 1X/week OT Treatment/Interventions: Self-care/ADL training;Therapeutic activities;DME and/or AE instruction;Patient/family education OT Recommendation Follow Up Recommendations: Skilled nursing facility Equipment Recommended: Defer to next venue Individuals Consulted Consulted and Agree with Results and Recommendations: Patient OT Goals Acute Rehab OT Goals OT Goal Formulation: With patient Time For Goal Achievement: 7  days ADL Goals Pt Will Perform Grooming: with set-up;Sitting, edge of bed;Other (comment);Unsupported (for 10 minutes) ADL Goal: Grooming - Progress: Goal set today Pt Will Perform Lower Body Bathing: with mod assist;Sit to stand from bed;Sit to stand from chair;with adaptive equipment ADL Goal: Lower Body Bathing - Progress: Goal set today Pt Will Perform Lower Body Dressing: with mod assist;with adaptive equipment;Sit to stand from bed;Sit to stand from chair ADL Goal: Lower Body Dressing - Progress: Goal set today Pt Will Transfer to Toilet: with mod assist;with DME;3-in-1;Stand pivot transfer;Maintaining hip precautions;Maintaining weight bearing status ADL Goal: Toilet Transfer - Progress: Goal set today Pt Will Perform Toileting - Clothing Manipulation: with min assist;Standing ADL Goal: Toileting - Clothing Manipulation - Progress: Goal set today Pt Will Perform Toileting - Hygiene: with min assist;Standing at 3-in-1/toilet ADL Goal: Toileting - Hygiene - Progress: Goal set today Additional ADL Goal #1: pt will state all hip precautions and PWB for L LE with independence ADL Goal: Additional Goal #1 - Progress: Goal set today  OT Evaluation Precautions/Restrictions  Precautions Precautions: Posterior Hip Restrictions Weight Bearing Restrictions: Yes LLE Weight Bearing: Partial weight bearing LLE Partial Weight Bearing Percentage or Pounds: 25-50% Prior Functioning Home Living Lives With: Alone Type of Home: House Home Layout: One level Home Access: Stairs to enter Entrance Stairs-Rails: Doctor, general practice of Steps: 3 Bathroom Shower/Tub: Engineer, manufacturing systems: Standard Home Adaptive Equipment: Grab bars in shower;Grab bars around toilet;Walker - four wheeled;Tub transfer bench Prior Function Level of Independence: Independent with basic ADLs;Independent with transfers;Requires assistive device for independence;Independent with gait;Independent with  homemaking with ambulation  Driving: No ADL ADL Eating/Feeding: Simulated;Independent;Other (comment) (with cup of water) Where Assessed - Eating/Feeding: Bed level Grooming: Simulated;Set up Where Assessed - Grooming: Sitting, chair;Supported Upper Body Bathing: Simulated;Chest;Right arm;Left arm;Abdomen;Supervision/safety;Set up Where Assessed - Upper Body Bathing: Sitting, bed;Unsupported Lower Body Bathing: +2 Total assistance;Comment for patient % Lower Body Bathing Details (indicate cue type and reason): pt 30% Where Assessed - Lower Body Bathing: Sit to stand from bed Upper Body Dressing: Simulated;Minimal assistance Upper Body Dressing Details (indicate cue type and reason): with gown Where Assessed - Upper Body Dressing: Sitting, bed;Unsupported Lower Body Dressing: +2 Total assistance;Comment for patient % Lower Body Dressing Details (indicate cue type and reason): pt 0% unable to free UEs from RW to simulate pull up pants. Pt with strong posterior lean in standing. also has THPs. Toilet Transfer: Simulated;+2 Total assistance;Comment for patient % Toilet Transfer Details (indicate cue type and reason): pt 25%. pt took about 5 steps and then had to have chair pulled up behind her as she couldnt step back to chair and needed to sit. Pt leaning posteriorly in standing.  Toileting - Clothing Manipulation: Simulated;+2 Total assistance;Comment for patient % Toileting - Clothing Manipulation Details (indicate cue type and reason): 0% Where Assessed - Glass blower/designer Manipulation: Standing Toileting - Hygiene: Simulated;+2 Total assistance;Comment for patient % Toileting - Hygiene Details (indicate cue type and reason): 0% Where Assessed - Toileting Hygiene: Standing Tub/Shower Transfer: Not assessed Tub/Shower Transfer Method: Not assessed Equipment Used: Rolling walker ADL Comments: Reviewed all hip precautions and PWB with pt. Began verbally educating on AE but have not  practiced with any AE yet.  Vision/Perception    Cognition Cognition Arousal/Alertness: Awake/alert Overall Cognitive Status: Appears within functional limits for tasks assessed Orientation Level: Oriented X4 Sensation/Coordination Sensation Light Touch: Appears Intact Extremity Assessment RUE Assessment RUE Assessment: Within Functional Limits LUE Assessment LUE Assessment: Within Functional Limits Mobility  Bed Mobility Bed Mobility: Yes Supine to Sit: 1: +2 Total assist;Patient percentage (comment) Supine to Sit Details (indicate cue type and reason): pt 25% and verbal cues for THPs Transfers Sit to Stand: 1: +2 Total assist;Patient percentage (comment);From bed Sit to Stand Details (indicate cue type and reason): pt 20% and verbal cues for THPs. Exercises   End of Session OT - End of Session Equipment Utilized During Treatment: Gait belt;Other (comment) (RW) Activity Tolerance: Patient limited by fatigue;Patient limited by pain Patient left: in chair;with call bell in reach General Behavior During Session: [facility] for tasks performed Cognition: [facility] for tasks performed   Lennox Laity [phone] 01/30/2012, 11:23 AM

## 2012-01-30 NOTE — Progress Notes (Addendum)
CSW visited with the pt and her son and completed psychosocial assessment (pls see shadow chart). Pt reports she is agreeable to SNF, and her top choices are [facility] [address] and [address]. The pts son reports he has called both facilities and left a voice message. CSW has completed the FL-2 and faxed out to the facilities. CSW will continue to follow the pt and offer support.  Golden Pop 01/30/2012 1:31 PM [phone]  CSW has spoken with the pts son and provided him with a list of facilities. Pt is out of network for [facility] and the fee would be $195 a day. CSW will present additional bed offers when received. CSW has updated Dr. Alanda Slim.  Ladene Artist N 01/30/2012 2:56 PM

## 2012-01-30 NOTE — Progress Notes (Signed)
Subjective: 2 Days Post-Op Procedure(s) (LRB): ARTHROPLASTY BIPOLAR HIP (Left) Patient reports pain as 3 on 0-10 scale.   Denies CP or SOB.  Voiding without difficulty. Positive flatus. She denies any dizziness Objective: Vital signs in last 24 hours: Temp:  [98.2 F (36.8 C)-99.5 F (37.5 C)] 98.6 F (37 C) (01/30 0455) Pulse Rate:  [80-87] 80  (01/30 0455) Resp:  [16-18] 18  (01/30 0455) BP: (111-141)/(72-81) 132/75 mmHg (01/30 0455) SpO2:  [87 %-100 %] 100 % (01/30 0455)  Intake/Output from previous day: 01/29 0701 - 01/30 0700 In: 540 [P.O.:540] Out: 350 [Urine:350] Intake/Output this shift:     Basename 01/30/12 0420 01/29/12 0354 01/28/12 0341  HGB 8.7* 9.7* 11.3*    Basename 01/30/12 0420 01/29/12 0354  WBC 11.1* 9.6  RBC 2.95* 3.25*  HCT 26.8* 29.7*  PLT 135* 136*    Basename 01/29/12 0354 01/28/12 0341  NA 140 139  K 3.2* 3.4*  CL 105 103  CO2 27 26  BUN 30* 31*  CREATININE 1.01 1.17*  GLUCOSE 122* 123*  CALCIUM 9.2 9.8   No results found for this basename: LABPT:2,INR:2 in the last 72 hours  Neurologically intact Neurovascular intact Sensation intact distally Dorsiflexion/Plantar flexion intact Incision: scant drainage and small hematoma at incision but soft in the thigh Compartment soft  Assessment/Plan: 2 Days Post-Op Procedure(s) (LRB): ARTHROPLASTY BIPOLAR HIP (Left) SNF pending Monitor H/H currently asymptomatic but on beta blocker Xarelto PT/OT Daily dressing change  Rosalinda Seaman R. 01/30/2012, 9:56 AM

## 2012-01-30 NOTE — Discharge Summary (Addendum)
Admit date: 01/27/2012 Discharge date: 02/03/2012  Primary Care Physician:  Kimber Relic, MD, MD   Discharge Diagnoses:   No active problems to display.  Active Hospital Problems  Diagnoses Date Noted     Resolved Hospital Problems  Diagnoses Date Noted Date Resolved  . Gall bladder stones 02/02/2012 02/02/2012  . Transaminitis 01/31/2012 02/02/2012  . Nausea alone 01/31/2012 02/02/2012  . Dehydration 01/30/2012 01/30/2012  . Fracture of femoral neck, left 01/30/2012 01/30/2012  . Atrial fibrillation 10/02/2011 01/30/2012  . HTN (hypertension) 10/02/2011 01/30/2012     DISCHARGE MEDICATION: Medication List  As of 02/03/2012 10:05 AM   STOP taking these medications         furosemide 20 MG tablet         TAKE these medications         aspirin EC 81 MG tablet   Take 81 mg by mouth daily.      ferrous sulfate 325 (65 FE) MG tablet   Take 1 tablet (325 mg total) by mouth daily with breakfast.      levothyroxine 75 MCG tablet   Commonly known as: SYNTHROID, LEVOTHROID   Take 75 mcg by mouth daily.      losartan-hydrochlorothiazide 100-25 MG per tablet   Commonly known as: HYZAAR   Take 1 tablet by mouth daily.      metoprolol succinate 50 MG 24 hr tablet   Commonly known as: TOPROL-XL   Take 50 mg by mouth daily. Take with or immediately following a meal.      mulitivitamin with minerals Tabs   Take 1 tablet by mouth daily.      omeprazole 20 MG capsule   Commonly known as: PRILOSEC   Take 20 mg by mouth daily.      oxyCODONE 5 MG immediate release tablet   Commonly known as: Oxy IR/ROXICODONE   Take 1 tablet (5 mg total) by mouth every 4 (four) hours as needed.      potassium chloride 10 MEQ CR capsule   Commonly known as: MICRO-K   Take 1 capsule (10 mEq total) by mouth daily.      ranitidine 75 MG tablet   Commonly known as: ZANTAC   Take 75 mg by mouth at bedtime.      rivaroxaban 10 MG Tabs tablet   Commonly known as: XARELTO   Take 1 tablet (10  mg total) by mouth daily.      sennosides-docusate sodium 8.6-50 MG tablet   Commonly known as: SENOKOT-S   Take 1 tablet by mouth daily.      VITAMIN D PO   Take by mouth.              Consults:   Jene Every, Orthopedics.   SIGNIFICANT DIAGNOSTIC STUDIES:  Dg Chest 1 View  01/27/2012  *RADIOLOGY REPORT*  Clinical Data: Left hip fracture.  Hypertension and A fib.  CHEST - 1 VIEW  Comparison: 10/02/2011  Findings: Stable postoperative changes in the mediastinum.  Shallow inspiration.  Mild cardiac enlargement and prominent pulmonary vascularity without edema suggesting early congestive changes. Interstitial fibrosis in the lung bases.  Mild interstitial edema is not excluded.  No focal airspace consolidation.  Calcification of the aorta.  IMPRESSION: Mild congestive changes in the heart and lungs with interstitial fibrosis and possible interstitial edema.  No focal consolidation.  Original Report Authenticated By: Marlon Pel, M.D.   Dg Hip Complete Left  01/27/2012  *RADIOLOGY REPORT*  Clinical Data: Left hip  pain after fall  LEFT HIP - COMPLETE 2+ VIEW  Comparison: None.  Findings: A transverse subcapital fracture of the left femoral neck with varus angulation.  Degenerative changes in the lower lumbar spine and hips.  Calcifications in the pelvis consistent with phleboliths.  Vascular calcifications.  Pelvis appears intact.  IMPRESSION: Transverse fracture of the left femoral neck with varus angulation.  Original Report Authenticated By: Marlon Pel, M.D.   Dg Hip Portable 1 View Left  01/29/2012  *RADIOLOGY REPORT*  Clinical Data: Postop left total hip  PORTABLE LEFT HIP - 1 VIEW  Comparison: 01/27/2013at 0503 hours  Findings: Single portable view of the left hip demonstrates interval placement of a left hip arthroplasty.  Noncemented femoral component.  Soft tissue gas and skin clips consistent with recent surgery.  Visualized components appear well seated.  No  subluxation from the acetabular joint.  IMPRESSION: Interval placement left hip arthroplasty.  Components appear well seated without acute fracture demonstrated.  Original Report Authenticated By: Marlon Pel, M.D.     Recent Results (from the past 240 hour(s))  URINE CULTURE     Status: Normal   Collection Time   01/27/12  4:55 AM      Component Value Range Status Comment   Specimen Description URINE, CATHETERIZED   Final    Special Requests NONE   Final    Culture  Setup Time [id number]   Final    Colony Count NO GROWTH   Final    Culture NO GROWTH   Final    Report Status 01/28/2012 FINAL   Final   SURGICAL PCR SCREEN     Status: Normal   Collection Time   01/27/12  9:32 PM      Component Value Range Status Comment   MRSA, PCR NEGATIVE  NEGATIVE  Final    Staphylococcus aureus NEGATIVE  NEGATIVE  Final     BRIEF ADMITTING H & P: 78 y/o female, known atrial fibrillation not on Coumadin, atrial flutter, hypothyroidism, hypertension, cardiomegaly, degenerative disc disease, porcine AV valve placement 2002, brain surgery for tumor 1970s Got up this am to go the rest room and was going to the rest-room and was going to rub some lotion on her leg as it was hurting. Swayed backwards and -this does happen occasionally-had a signifcant fall maybe 2 years ago. Tried to get to the telephone and . Usually uses a walker, but didn;t use it to get to the rest-room. No dizzyness at the time of the fall. Just felt unbalanced. No Chest pain at time of fall. No blurred vision or othe rissues. Vision usually is good. Not on coumadin for atrial fibrillation.  In 10 on 10 pain right now because of the fracture. And requesting pain medications   No active problems to display.  Active Hospital Problems  Diagnoses Date Noted     Resolved Hospital Problems      Diagnoses Date Noted Date Resolved   Gall Bladder stone/transaminitis/Nausea and vomiting: After hip surgery patient started complaining  of nausea and vomiting, ultrasound of gall bladder was done showed gallbladder stone and mild wall thickening. LFT's were done showed high billirubin and high AST/ALT. she was put NPO start on IV antibiotics. Surgery was consulted. LFt's monitor over 48 hrs they trended down. Due to the patient age, anticoagulation and the fact that her symptoms resolved. Surgery recommended conservative management.    . Dehydration:Resolved with IV fluids. Patient was on 2 diuretics at home. She was kept  on HCTZ.  01/30/2012 01/30/2012  . Fracture of femoral neck, left: X-ray was done with results as above. Ortho was consulted recommended ORIF. She had no complication after surgery. Hbg drop 8.7 she remained asymptomatic. PT evaluated her and recommended SNF. Ortho recommended Xarelto temporarily. 01/30/2012 01/30/2012  . Atrial fibrillation: Rate controlled. 10/02/2011 01/30/2012  . HTN (hypertension): Controlled. Lasix was d/c. Continue ARB and HCTZ. Beta blocker. 10/02/2011 01/30/2012     Disposition and Follow-up:  Discharge Orders    Future Orders Please Complete By Expires   Diet - low sodium heart healthy      Diet - low sodium heart healthy      Increase activity slowly      Increase activity slowly        Follow-up Information    Follow up with GREEN, Lenon Curt, MD in 2 weeks.          DISCHARGE EXAM:   Gen: no complains. Alert and oriented x 3. Cardio: Regular rate and rhythm, no rubs or murmmur. Lungs: good air movement CTA B/L Abd: +BS, nontender, none distended. Neuro: intact.  Blood pressure 133/73, pulse 75, temperature 98.3 F (36.8 C), temperature source Oral, resp. rate 18, height 5\' 2"  (1.575 m), weight 63.6 kg (140 lb 3.4 oz), SpO2 95.00%.   Basename 02/01/12 0420 01/31/12 1120  NA 136 136  K 3.5 3.8  CL 102 99  CO2 25 25  GLUCOSE 110* 150*  BUN 24* 28*  CREATININE 0.97 0.98  CALCIUM 9.1 9.6  MG -- --  PHOS -- --    Basename 02/01/12 0420 01/31/12 1120  AST 73*  142*  ALT 63* 84*  ALKPHOS 246* 318*  BILITOT 1.5* 3.9*  PROT 5.0* 5.9*  ALBUMIN 2.1* 2.5*   No results found for this basename: LIPASE:2,AMYLASE:2 in the last 72 hours  Basename 02/01/12 0420  WBC 10.1  NEUTROABS --  HGB 8.2*  HCT 24.5*  MCV 90.4  PLT 160    Signed: Marinda Elk M.D. 02/03/2012, 10:05 AM

## 2012-01-30 NOTE — Progress Notes (Signed)
Physical Therapy Treatment Patient Details Name: Vicki Velasquez MRN: [id number] DOB: 1916/07/14 Today's Date: 01/30/2012  L Hemiarthroplasty 2nd fall/ Fx POD #2 THP      PWB 9:40 - 10:05 1 ta  1 gt  PT Assessment/Plan  PT - Assessment/Plan Comments on Treatment Session: Pt cute, A x O x 4. Recalls how she fell.  Pt plans to D/C to SNF for Rehab. PT Plan: Discharge plan remains appropriate Follow Up Recommendations: Skilled nursing facility Equipment Recommended: Defer to next venue PT Goals  Acute Rehab PT Goals PT Goal Formulation: With patient Pt will go Supine/Side to Sit: with mod assist PT Goal: Supine/Side to Sit - Progress: Progressing toward goal Pt will go Sit to Stand: with mod assist PT Goal: Sit to Stand - Progress: Progressing toward goal Pt will go Stand to Sit: with mod assist PT Goal: Stand to Sit - Progress: Progressing toward goal Pt will Transfer Bed to Chair/Chair to Bed: with mod assist PT Transfer Goal: Bed to Chair/Chair to Bed - Progress: Progressing toward goal Pt will Perform Home Exercise Program: with min assist PT Goal: Perform Home Exercise Program - Progress: Not met  Pt was able to take a few steps, will discuss with LPT to add an ambulation goal  PT Treatment Precautions/Restrictions  Precautions Precautions: Posterior Hip Precaution Comments: Pt instructed on THP as pt recalled only 1/3 Required Braces or Orthoses: No Restrictions Weight Bearing Restrictions: Yes LLE Weight Bearing: Partial weight bearing LLE Partial Weight Bearing Percentage or Pounds: Pt instructed on PWB Mobility (including Balance) Bed Mobility Bed Mobility: Yes Supine to Sit: 1: +2 Total assist Supine to Sit Details (indicate cue type and reason): Total assist + 2  pt 15% with increased time, HOB increased 45' and pivot using pad Transfers Transfers: Yes Sit to Stand: 1: +2 Total assist;From bed Sit to Stand Details (indicate cue type and reason): Total assist  + 2 pt <25%  with 75% VC's on proper tech to avoid hip flex > 90'and proper hand placement Stand to Sit: 1: +2 Total assist;To chair/3-in-1 Stand to Sit Details: Total assist + 2 pt 25% with 75% VC's on hand placement and to advance L LE prior to sit. Ambulation/Gait Ambulation/Gait: Yes Ambulation/Gait Assistance: 1: +2 Total assist Ambulation/Gait Assistance Details (indicate cue type and reason): Total assist + 3 (3rd assist following with chair) pt 30% with 75% VC's on proper sequencing and upright posture.  Great difficulty weight shifting to L and L knee buckling.  very limited act tolerance. Ambulation Distance (Feet): 3 Feet Assistive device: Rolling walker Gait Pattern: Step-to pattern;Decreased stance time - left;Decreased step length - right;Trunk flexed;Shuffle Stairs: No Corporate treasurer: No    Exercise    End of Session PT - End of Session Equipment Utilized During Treatment: Gait belt Activity Tolerance: Patient limited by pain Patient left: in chair;with call bell in reach General Behavior During Session: [facility] for tasks performed Cognition: [facility] for tasks performed  Felecia Shelling  PTA [facility]  Acute  Rehab Pager     [phone]

## 2012-01-31 ENCOUNTER — Inpatient Hospital Stay (HOSPITAL_COMMUNITY): Payer: Medicare Other

## 2012-01-31 ENCOUNTER — Encounter (HOSPITAL_COMMUNITY): Payer: Self-pay | Admitting: Specialist

## 2012-01-31 DIAGNOSIS — R110 Nausea: Secondary | ICD-10-CM | POA: Diagnosis not present

## 2012-01-31 LAB — CBC
HCT: 25.5 % — ABNORMAL LOW (ref 36.0–46.0)
MCH: 30.4 pg (ref 26.0–34.0)
MCV: 90.1 fL (ref 78.0–100.0)
RDW: 13.5 % (ref 11.5–15.5)
WBC: 11.4 10*3/uL — ABNORMAL HIGH (ref 4.0–10.5)

## 2012-01-31 LAB — CARDIAC PANEL(CRET KIN+CKTOT+MB+TROPI)
CK, MB: 4.5 ng/mL — ABNORMAL HIGH (ref 0.3–4.0)
Total CK: 204 U/L — ABNORMAL HIGH (ref 7–177)
Total CK: 221 U/L — ABNORMAL HIGH (ref 7–177)

## 2012-01-31 LAB — COMPREHENSIVE METABOLIC PANEL
Albumin: 2.5 g/dL — ABNORMAL LOW (ref 3.5–5.2)
BUN: 28 mg/dL — ABNORMAL HIGH (ref 6–23)
Chloride: 99 mEq/L (ref 96–112)
Creatinine, Ser: 0.98 mg/dL (ref 0.50–1.10)
GFR calc non Af Amer: 48 mL/min — ABNORMAL LOW (ref >90)
Total Bilirubin: 3.9 mg/dL — ABNORMAL HIGH (ref 0.3–1.2)

## 2012-01-31 LAB — LACTATE DEHYDROGENASE: LDH: 278 U/L — ABNORMAL HIGH (ref 94–250)

## 2012-01-31 MED ORDER — PIPERACILLIN-TAZOBACTAM 3.375 G IVPB
3.3750 g | Freq: Three times a day (TID) | INTRAVENOUS | Status: DC
  Administered 2012-01-31 – 2012-02-02 (×6): 3.375 g via INTRAVENOUS
  Filled 2012-01-31 (×9): qty 50

## 2012-01-31 MED ORDER — OXYCODONE HCL 5 MG PO TABS: 5.0000 mg | ORAL_TABLET | ORAL | Status: AC | PRN

## 2012-01-31 NOTE — Progress Notes (Addendum)
CSW presented bed offers and the pt and family have decided on [facility]. CSW has faxed clinicals to [facility] at the facility and confirmed they were received. Per Lowella Bandy the pts son plans to come to the facility at 11 to complete the paperwork. CSW has spoken with the pts RN Dwana Curd and she is aware. CSW has prearranged [facility] transportation for 1:30 per request from the facility. CSW has compiled documents to transport with the pt. CSW signing off. Golden Pop 01/31/2012 10:21 AM [phone]  CSW called earlier this evening and notified that the pt was not feeling well. CSW canceled [facility] transportation and called Nikki at the facility to to let her know. CSW will continue to follow. Amareon Phung N 3:11 PM

## 2012-01-31 NOTE — Progress Notes (Addendum)
Subjective:  Some mild nausea this am after the morning medications. She tolerated her breakfast. Objective: Filed Vitals:   01/30/12 2140 01/31/12 0000 01/31/12 0400 01/31/12 0600  BP: 102/49   119/75  Pulse: 69   78  Temp: 97.7 F (36.5 C)   98.2 F (36.8 C)  TempSrc: Oral   Oral  Resp: 16 16 16 17   Height:      Weight:      SpO2: 98% 98% 99% 98%   Weight change:   Intake/Output Summary (Last 24 hours) at 01/31/12 1031 Last data filed at 01/31/12 0600  Gross per 24 hour  Intake    360 ml  Output    575 ml  Net   -215 ml    General: Alert, awake, oriented x3, in no acute distress.  HEENT: No bruits, no goiter.  Heart: Regular rate and rhythm, without murmurs, rubs, gallops.  Lungs:good air movement cta b/l. Abdomen: Soft, nontender, nondistended, positive bowel sounds.  Neuro: Grossly intact, nonfocal.   Lab Results:  Basename 01/29/12 0354  NA 140  K 3.2*  CL 105  CO2 27  GLUCOSE 122*  BUN 30*  CREATININE 1.01  CALCIUM 9.2  MG --  PHOS --   No results found for this basename: AST:2,ALT:2,ALKPHOS:2,BILITOT:2,PROT:2,ALBUMIN:2 in the last 72 hours No results found for this basename: LIPASE:2,AMYLASE:2 in the last 72 hours  Basename 01/31/12 0428 01/30/12 0420  WBC 11.4* 11.1*  NEUTROABS -- --  HGB 8.6* 8.7*  HCT 25.5* 26.8*  MCV 90.1 90.8  PLT 145* 135*   No results found for this basename: CKTOTAL:3,CKMB:3,CKMBINDEX:3,TROPONINI:3 in the last 72 hours No components found with this basename: POCBNP:3 No results found for this basename: DDIMER:2 in the last 72 hours No results found for this basename: HGBA1C:2 in the last 72 hours No results found for this basename: CHOL:2,HDL:2,LDLCALC:2,TRIG:2,CHOLHDL:2,LDLDIRECT:2 in the last 72 hours No results found for this basename: TSH,T4TOTAL,FREET3,T3FREE,THYROIDAB in the last 72 hours No results found for this basename: VITAMINB12:2,FOLATE:2,FERRITIN:2,TIBC:2,IRON:2,RETICCTPCT:2 in the last 72 hours  Micro  Results: Recent Results (from the past 240 hour(s))  URINE CULTURE     Status: Normal   Collection Time   01/27/12  4:55 AM      Component Value Range Status Comment   Specimen Description URINE, CATHETERIZED   Final    Special Requests NONE   Final    Culture  Setup Time [id number]   Final    Colony Count NO GROWTH   Final    Culture NO GROWTH   Final    Report Status 01/28/2012 FINAL   Final   SURGICAL PCR SCREEN     Status: Normal   Collection Time   01/27/12  9:32 PM      Component Value Range Status Comment   MRSA, PCR NEGATIVE  NEGATIVE  Final    Staphylococcus aureus NEGATIVE  NEGATIVE  Final     Studies/Results: No results found.  Medications: I have reviewed the patient's current medications.  Assessment and plan: -nausea and elevated bilirubin and LFT's: -NPO -Check hepatitis panel. Check cardiac enzymes. LDH, abdominal ultrasound, check and EKG. Check a 2-d echo. -start zosyn per pharmacy.       LOS: 4 days   Marinda Elk M.D. Pager: [phone] [facility] 01/31/2012, 10:31 AM

## 2012-01-31 NOTE — Progress Notes (Signed)
ANTIBIOTIC CONSULT NOTE - INITIAL  Pharmacy Consult for Zosyn Indication: Empiric  Allergies  Allergen Reactions  . Biaxin   . Codeine   . Demerol   . Prednisone     Patient Measurements: Height: 5\' 2"  (157.5 cm) Weight: 140 lb 3.4 oz (63.6 kg) IBW/kg (Calculated) : 50.1    Vital Signs: Temp: 98.2 F (36.8 C) (01/31 0600) Temp src: Oral (01/31 0600) BP: 119/75 mmHg (01/31 0600) Pulse Rate: 78  (01/31 0600) Intake/Output from previous day: 01/30 0701 - 01/31 0700 In: 480 [P.O.:480] Out: 575 [Urine:575] Intake/Output from this shift: Total I/O In: 240 [P.O.:240] Out: -   Labs:  Basename 01/31/12 1120 01/31/12 0428 01/30/12 0420 01/29/12 0354  WBC -- 11.4* 11.1* 9.6  HGB -- 8.6* 8.7* 9.7*  PLT -- 145* 135* 136*  LABCREA -- -- -- --  CREATININE 0.98 -- -- 1.01   Estimated Creatinine Clearance: 31.4 ml/min (by C-G formula based on Cr of 0.98).   Microbiology: Recent Results (from the past 720 hour(s))  URINE CULTURE     Status: Normal   Collection Time   01/27/12  4:55 AM      Component Value Range Status Comment   Specimen Description URINE, CATHETERIZED   Final    Special Requests NONE   Final    Culture  Setup Time [id number]   Final    Colony Count NO GROWTH   Final    Culture NO GROWTH   Final    Report Status 01/28/2012 FINAL   Final   SURGICAL PCR SCREEN     Status: Normal   Collection Time   01/27/12  9:32 PM      Component Value Range Status Comment   MRSA, PCR NEGATIVE  NEGATIVE  Final    Staphylococcus aureus NEGATIVE  NEGATIVE  Final     Medical History: Past Medical History  Diagnosis Date  . Atrial flutter   . Hypothyroidism   . Hypertension   . Edema     Leg  . Atrial fibrillation     not on coumadin-managed on meciaitons.  . SOB (shortness of breath) 12/16/10    At rest-no oxygen at rest  . Chronic lung disease     Stable  . Cardiomegaly     Stable  . Disc disease, degenerative, lumbar or lumbosacral     Dr. Dyane Dustman  Medicine-Last Steroids 5 yr ago    Medications:  Scheduled:    . famotidine  20 mg Oral Daily  . ferrous sulfate  325 mg Oral Q breakfast  . furosemide  20 mg Oral Daily  . levothyroxine  75 mcg Oral Daily  . metoprolol succinate  50 mg Oral Daily  . potassium chloride  20 mEq Oral Daily  . potassium chloride  40 mEq Oral Once  . rivaroxaban  10 mg Oral Daily  . senna-docusate  1 tablet Oral Daily   Assessment:  78 yo female s/p hip fracture repair 01/28/12  To begin Zosyn for empiric therapy  Renal function is impaired, but may use standard dose since CrCl is >42ml/min  Goal of Therapy:  Empiric coverage  Plan:  Zosyn 3.375gm IV q8h (4hr extended infusions) Follow renal function and adjust as needed  Loralee Pacas, PharmD, BCPS Pager: [phone] 01/31/2012,1:06 PM

## 2012-01-31 NOTE — Progress Notes (Signed)
  Echocardiogram 2D Echocardiogram has been performed.  Vicki Velasquez 01/31/2012, 4:31 PM

## 2012-01-31 NOTE — Progress Notes (Signed)
Subjective: 3 Days Post-Op Procedure(s) (LRB): ARTHROPLASTY BIPOLAR HIP (Left) Patient reports pain as 4 on 0-10 scale.   Denies CP or SOB.  Voiding without difficulty. Positive flatus. Patient seen earlier in the day Objective: Vital signs in last 24 hours: Temp:  [97.7 F (36.5 C)-98.2 F (36.8 C)] 98 F (36.7 C) (01/31 1433) Pulse Rate:  [69-78] 75  (01/31 1433) Resp:  [16-17] 16  (01/31 1433) BP: (102-127)/(49-75) 127/58 mmHg (01/31 1433) SpO2:  [98 %-99 %] 99 % (01/31 1433)  Intake/Output from previous day: 01/30 0701 - 01/31 0700 In: 480 [P.O.:480] Out: 575 [Urine:575] Intake/Output this shift: Total I/O In: 720 [P.O.:720] Out: -    Basename 01/31/12 0428 01/30/12 0420 01/29/12 0354  HGB 8.6* 8.7* 9.7*    Basename 01/31/12 0428 01/30/12 0420  WBC 11.4* 11.1*  RBC 2.83* 2.95*  HCT 25.5* 26.8*  PLT 145* 135*    Basename 01/31/12 1120 01/29/12 0354  NA 136 140  K 3.8 3.2*  CL 99 105  CO2 25 27  BUN 28* 30*  CREATININE 0.98 1.01  GLUCOSE 150* 122*  CALCIUM 9.6 9.2   No results found for this basename: LABPT:2,INR:2 in the last 72 hours  Neurologically intact Neurovascular intact Sensation intact distally Dorsiflexion/Plantar flexion intact Incision: no drainage Compartment soft Patient appears a bit jaundice today  Alert and oriented Assessment/Plan: 3 Days Post-Op Procedure(s) (LRB): ARTHROPLASTY BIPOLAR HIP (Left) CMET was ordered will defer medical treatment to IM Cont current care in regard to hip SNF pending medical clearance   Ahmet Schank R. 01/31/2012, 4:35 PM

## 2012-02-01 ENCOUNTER — Encounter (HOSPITAL_COMMUNITY): Payer: Self-pay | Admitting: Radiology

## 2012-02-01 DIAGNOSIS — R945 Abnormal results of liver function studies: Secondary | ICD-10-CM

## 2012-02-01 DIAGNOSIS — K8020 Calculus of gallbladder without cholecystitis without obstruction: Secondary | ICD-10-CM

## 2012-02-01 LAB — COMPREHENSIVE METABOLIC PANEL
ALT: 63 U/L — ABNORMAL HIGH (ref 0–35)
AST: 73 U/L — ABNORMAL HIGH (ref 0–37)
Alkaline Phosphatase: 246 U/L — ABNORMAL HIGH (ref 39–117)
CO2: 25 mEq/L (ref 19–32)
Chloride: 102 mEq/L (ref 96–112)
GFR calc non Af Amer: 49 mL/min — ABNORMAL LOW (ref >90)
Sodium: 136 mEq/L (ref 135–145)
Total Bilirubin: 1.5 mg/dL — ABNORMAL HIGH (ref 0.3–1.2)

## 2012-02-01 LAB — CBC
MCV: 90.4 fL (ref 78.0–100.0)
Platelets: 160 10*3/uL (ref 150–400)
RBC: 2.71 MIL/uL — ABNORMAL LOW (ref 3.87–5.11)
WBC: 10.1 10*3/uL (ref 4.0–10.5)

## 2012-02-01 LAB — CARDIAC PANEL(CRET KIN+CKTOT+MB+TROPI): CK, MB: 4.1 ng/mL — ABNORMAL HIGH (ref 0.3–4.0)

## 2012-02-01 NOTE — Progress Notes (Signed)
CSW spoke with Lowella Bandy at [facility] and she reports that they can accept the pt over the weekend if she discharges. CSW has informed Dr. David Stall and will leave notes for the CSW covering the weekend.  Golden Pop 02/01/2012 12:45 PM [phone]

## 2012-02-01 NOTE — Progress Notes (Signed)
Subjective:  Abdominal discomfort resolved. Nausea and vommitting gone. She tolerated her breakfast. Large BM overnight. Objective: Filed Vitals:   01/31/12 1433 01/31/12 2100 02/01/12 0600 02/01/12 0800  BP: 127/58 127/58 117/66 148/74  Pulse: 75 84 72 87  Temp: 98 F (36.7 C) 97.2 F (36.2 C) 98.4 F (36.9 C) 97.9 F (36.6 C)  TempSrc: Oral Oral Oral Oral  Resp: 16 14 16 16   Height:      Weight:      SpO2: 99% 99% 96% 96%   Weight change:   Intake/Output Summary (Last 24 hours) at 02/01/12 1004 Last data filed at 02/01/12 0115  Gross per 24 hour  Intake    480 ml  Output    300 ml  Net    180 ml    General: Alert, awake, oriented x3, in no acute distress.  HEENT: No bruits, no goiter.  Heart: Regular rate and rhythm, without murmurs, rubs, gallops.  Lungs:good air movement cta b/l. Abdomen: Soft, nontender, nondistended, positive bowel sounds.  Neuro: Grossly intact, nonfocal.   Lab Results:  Basename 02/01/12 0420 01/31/12 1120  NA 136 136  K 3.5 3.8  CL 102 99  CO2 25 25  GLUCOSE 110* 150*  BUN 24* 28*  CREATININE 0.97 0.98  CALCIUM 9.1 9.6  MG -- --  PHOS -- --    Basename 02/01/12 0420 01/31/12 1120  AST 73* 142*  ALT 63* 84*  ALKPHOS 246* 318*  BILITOT 1.5* 3.9*  PROT 5.0* 5.9*  ALBUMIN 2.1* 2.5*   No results found for this basename: LIPASE:2,AMYLASE:2 in the last 72 hours  Basename 02/01/12 0420 01/31/12 0428  WBC 10.1 11.4*  NEUTROABS -- --  HGB 8.2* 8.6*  HCT 24.5* 25.5*  MCV 90.4 90.1  PLT 160 145*    Basename 02/01/12 0420 01/31/12 2025 01/31/12 1429  CKTOTAL 147 221* 204*  CKMB 4.1* 5.0* 4.5*  CKMBINDEX -- -- --  TROPONINI <0.30 <0.30 <0.30   No components found with this basename: POCBNP:3 No results found for this basename: DDIMER:2 in the last 72 hours No results found for this basename: HGBA1C:2 in the last 72 hours No results found for this basename: CHOL:2,HDL:2,LDLCALC:2,TRIG:2,CHOLHDL:2,LDLDIRECT:2 in the last 72  hours No results found for this basename: TSH,T4TOTAL,FREET3,T3FREE,THYROIDAB in the last 72 hours No results found for this basename: VITAMINB12:2,FOLATE:2,FERRITIN:2,TIBC:2,IRON:2,RETICCTPCT:2 in the last 72 hours  Micro Results: Recent Results (from the past 240 hour(s))  URINE CULTURE     Status: Normal   Collection Time   01/27/12  4:55 AM      Component Value Range Status Comment   Specimen Description URINE, CATHETERIZED   Final    Special Requests NONE   Final    Culture  Setup Time [id number]   Final    Colony Count NO GROWTH   Final    Culture NO GROWTH   Final    Report Status 01/28/2012 FINAL   Final   SURGICAL PCR SCREEN     Status: Normal   Collection Time   01/27/12  9:32 PM      Component Value Range Status Comment   MRSA, PCR NEGATIVE  NEGATIVE  Final    Staphylococcus aureus NEGATIVE  NEGATIVE  Final     Studies/Results: US Abdomen Complete  01/31/2012  *RADIOLOGY REPORT*  Clinical Data:  Nausea, elevated LFTs, hypertension.  COMPLETE ABDOMINAL ULTRASOUND  Comparison:  09/19/2008  Findings:  Gallbladder:  There is a mobile 2.5 cm stone in the fundus of the  gallbladder.  There is sludge in the gallbladder.  Gallbladder wall thickening up to 3.4 mm.  No pericholecystic fluid.  Sonographer reports no sonographic Murphy's sign.  Common bile duct:   7 mm diameter, unremarkable  Liver:  No focal lesion identified.  Within normal limits in parenchymal echogenicity.  IVC:  Appears normal.  Pancreas:  No focal abnormality seen.  Spleen:  6.2 cm craniocaudal length, unremarkable.  Right Kidney:  9.9 cm. No hydronephrosis.  Well-preserved cortex. Normal size and parenchymal echotexture without focal abnormalities.  Left Kidney:  11.3 cm in length.  There is a 33 x 41 x 44 mm simple appearing cyst in the interpolar region.  No hydronephrosis or solid renal mass.  Abdominal aorta:  No aneurysm identified.  IMPRESSION:  1.  Cholelithiasis and mild gallbladder wall thickening. 2.  4.4 cm  left renal cyst.  Original Report Authenticated By: Osa Craver, M.D.    Medications: I have reviewed the patient's current medications.  Assessment and plan: Transaminits: -Large bowel movement overnight. Abdominal discomfort resolved. She probably passed a stone. LFT's down, billi down. U/s mild gallbladder thickening. Surgery consulted, awaiting recommendations. -Tolerating diet. -continue zosyn.    LOS: 5 days   Marinda Elk M.D. Pager: [phone] [facility] 02/01/2012, 10:04 AM

## 2012-02-01 NOTE — Consult Note (Signed)
Patient interviewed and examined, agree with PA note above.  She currently has no pain and her abdomen is non tender.  LFT's are improving. It appears that she has passed a common bile duct stone and I do not see any evidence of continued bile duct obstruction or acute cholecystitis. Considering the patient's age, her recent surgery, and indication for anticoagulation currently I would recommend expectant nonoperative management as long as she remains asymptomatic and her LFTs returned to normal. Will follow with you. This was discussed with the patient and her son who agree.  Mariella Saa MD, FACS  02/01/2012 4:58 PM

## 2012-02-01 NOTE — Progress Notes (Signed)
Subjective: 4 Days Post-Op Procedure(s) (LRB): ARTHROPLASTY BIPOLAR HIP (Left) Patient reports pain as 3 on 0-10 scale.  When not moving.  Denies CP or SOB.  Positive flatus.  She states she feels better today.  Denies any nausea or dizziness.   Objective: Vital signs in last 24 hours: Temp:  [97.2 F (36.2 C)-98.4 F (36.9 C)] 98.4 F (36.9 C) (02/01 0600) Pulse Rate:  [72-84] 72  (02/01 0600) Resp:  [14-16] 16  (02/01 0600) BP: (117-127)/(58-66) 117/66 mmHg (02/01 0600) SpO2:  [96 %-99 %] 96 % (02/01 0600)  Intake/Output from previous day: 01/31 0701 - 02/01 0700 In: 720 [P.O.:720] Out: 300 [Urine:300] Intake/Output this shift:     Basename 02/01/12 0420 01/31/12 0428 01/30/12 0420  HGB 8.2* 8.6* 8.7*    Basename 02/01/12 0420 01/31/12 0428  WBC 10.1 11.4*  RBC 2.71* 2.83*  HCT 24.5* 25.5*  PLT 160 145*    Basename 02/01/12 0420 01/31/12 1120  NA 136 136  K 3.5 3.8  CL 102 99  CO2 25 25  BUN 24* 28*  CREATININE 0.97 0.98  GLUCOSE 110* 150*  CALCIUM 9.1 9.6   No results found for this basename: LABPT:2,INR:2 in the last 72 hours  Neurologically intact Neurovascular intact Sensation intact distally Dorsiflexion/Plantar flexion intact Incision: dressing C/D/I Compartment soft moderate ecchymosis with small hematoma, area is soft NT Color appears better today  Assessment/Plan: 4 Days Post-Op Procedure(s) (LRB): ARTHROPLASTY BIPOLAR HIP (Left) Up with therapy Cont current care orthopaedically stable Xarelto Disposition per IM   Sabrinia Prien R. 02/01/2012, 7:45 AM

## 2012-02-01 NOTE — Consult Note (Signed)
Reason for Consult: Gallstones, elevated LFTs Consulting Surgeon: Johna Sheriff Referring Physician: David Stall   HPI: Vicki Velasquez is an 78 y.o. female who fell a few days ago and fractured her (L)hip. She was admitted and this was fixed by ortho team. However, while here she developed some nausea and upper abd pain. Labs done showed elevated LFTs including bilirubin to 3.9. An [place] was done showing gallstones and borderline wall thickening but no pericholecystic fluid. She has a complex medical and cardiac history and is currently on Xarelto. Her prior abdominal surgery is that of partial hysterectomy, at which time they removed her appendix as well, she believes this was in 1961. Currently then pt feels much better than she did a day ago. She ate some reg breakfast and has not had recurrence of her pain or nausea. Repeat LFTs done this am are improved. Surgical consult requested.  Past Medical History:  Past Medical History  Diagnosis Date  . Atrial flutter   . Hypothyroidism   . Hypertension   . Edema     Leg  . Atrial fibrillation     not on coumadin-managed on meciaitons.  . SOB (shortness of breath) 12/16/10    At rest-no oxygen at rest  . Chronic lung disease     Stable  . Cardiomegaly     Stable  . Disc disease, degenerative, lumbar or lumbosacral     Dr. Dyane Dustman Medicine-Last Steroids 5 yr ago    Surgical History:  Past Surgical History  Procedure Date  . Aortic valve replacement     2002 replaced with a pig vavle.    . Tumour     had surgery ion the 70's for it   . Hip arthroplasty 01/28/2012    Procedure: ARTHROPLASTY BIPOLAR HIP;  Surgeon: Javier Docker, MD;  Location: [facility];  Service: Orthopedics;  Laterality: Left;  . Partial hysterectomy 1961  . Appendectomy 1961    Family History: History reviewed. No pertinent family history.  Social History:  reports that she has never smoked. She does not have any smokeless tobacco history on file. She reports that  she does not drink alcohol. Her drug history not on file.  Allergies:  Allergies  Allergen Reactions  . Biaxin   . Codeine   . Demerol   . Prednisone     Medications:  Prior to Admission:  Prescriptions prior to admission  Medication Sig Dispense Refill  . aspirin EC 81 MG tablet Take 81 mg by mouth daily.      . Cholecalciferol (VITAMIN D PO) Take by mouth.        . levothyroxine (SYNTHROID, LEVOTHROID) 75 MCG tablet Take 75 mcg by mouth daily.        Marland Kitchen losartan-hydrochlorothiazide (HYZAAR) 100-25 MG per tablet Take 1 tablet by mouth daily.        . metoprolol succinate (TOPROL-XL) 50 MG 24 hr tablet Take 50 mg by mouth daily. Take with or immediately following a meal.      . Multiple Vitamin (MULITIVITAMIN WITH MINERALS) TABS Take 1 tablet by mouth daily.      Marland Kitchen omeprazole (PRILOSEC) 20 MG capsule Take 20 mg by mouth daily.        . potassium chloride (MICRO-K) 10 MEQ CR capsule Take 1 capsule (10 mEq total) by mouth daily.  30 capsule  5  . ranitidine (ZANTAC) 75 MG tablet Take 75 mg by mouth at bedtime.        . sennosides-docusate sodium (  SENOKOT-S) 8.6-50 MG tablet Take 1 tablet by mouth daily.        Marland Kitchen DISCONTD: furosemide (LASIX) 20 MG tablet Take 1 tablet (20 mg total) by mouth daily.  30 tablet  11  . DISCONTD: OXYCODONE HCL PO Take 5 mg by mouth every 6 (six) hours as needed. Pain       Now also on Xarelto   ROS: See HPI for pertinent findings.  Physical Exam: Blood pressure 148/74, pulse 87, temperature 97.9 F (36.6 C), temperature source Oral, resp. rate 16, height 5\' 2"  (1.575 m), weight 63.6 kg (140 lb 3.4 oz), SpO2 96.00%.  General Appearance:  Alert, cooperative, thin elderly female, NAD  Head:  Normocephalic, without obvious abnormality, atraumatic  ENT: Unremarkable  Neck: Supple, symmetrical, trachea midline, no adenopathy, thyroid: not enlarged, symmetric, no tenderness/mass/nodules  Lungs:   Clear to auscultation bilaterally, no w/r/r, respirations  unlabored without use of accessory muscles.  Chest Wall:  No tenderness or deformity, sternotomy scar present  Heart:  Regular rate and rhythm, no murmur, rub or gallop. Carotids 2+ without bruit.  Abdomen:   Soft, non-tender, non distended. Bowel sounds active all four quadrants,  no masses, no organomegaly. Lower midline scar c/w hx.  Genitalia:  Normal. No hernias  Rectal:  Deferred.  Extremities: Extremities normal, atraumatic, no cyanosis or edema  Skin: Skin color normal, no jaundice. no rashes or lesions.  Neurologic: Normal affect, no gross deficits.     Labs: CBC  Basename 02/01/12 0420 01/31/12 0428  WBC 10.1 11.4*  HGB 8.2* 8.6*  HCT 24.5* 25.5*  PLT 160 145*   MET  Basename 02/01/12 0420 01/31/12 1120  NA 136 136  K 3.5 3.8  CL 102 99  CO2 25 25  GLUCOSE 110* 150*  BUN 24* 28*  CREATININE 0.97 0.98  CALCIUM 9.1 9.6    Basename 02/01/12 0420  PROT 5.0*  ALBUMIN 2.1*  AST 73*  ALT 63*  ALKPHOS 246*  BILITOT 1.5*  BILIDIR --  IBILI --  LIPASE --   PT/INR No results found for this basename: LABPROT:2,INR:2 in the last 72 hours ABG No results found for this basename: PHART:2,PCO2:2,PO2:2,HCO3:2 in the last 72 hours    US Abdomen Complete  01/31/2012  *RADIOLOGY REPORT*  Clinical Data:  Nausea, elevated LFTs, hypertension.  COMPLETE ABDOMINAL ULTRASOUND  Comparison:  09/19/2008  Findings:  Gallbladder:  There is a mobile 2.5 cm stone in the fundus of the gallbladder.  There is sludge in the gallbladder.  Gallbladder wall thickening up to 3.4 mm.  No pericholecystic fluid.  Sonographer reports no sonographic Murphy's sign.  Common bile duct:   7 mm diameter, unremarkable  Liver:  No focal lesion identified.  Within normal limits in parenchymal echogenicity.  IVC:  Appears normal.  Pancreas:  No focal abnormality seen.  Spleen:  6.2 cm craniocaudal length, unremarkable.  Right Kidney:  9.9 cm. No hydronephrosis.  Well-preserved cortex. Normal size and  parenchymal echotexture without focal abnormalities.  Left Kidney:  11.3 cm in length.  There is a 33 x 41 x 44 mm simple appearing cyst in the interpolar region.  No hydronephrosis or solid renal mass.  Abdominal aorta:  No aneurysm identified.  IMPRESSION:  1.  Cholelithiasis and mild gallbladder wall thickening. 2.  4.4 cm left renal cyst.  Original Report Authenticated By: Osa Craver, M.D.    Assessment/Plan: Suspect she passed a gallstone to account for both symptoms and change in LFTs. She is  currently asymptomatic and LFTs have decreased and are near normal. She tolerated her hip surgery well and Echo looks good. Will d/w MD regarding need for chole this admit. We would need to hold her Xarelto prior to procedure. On the other hand, she does not clinically have acute cholecystitis and the US findings for wall thickening are marginal. She could be treated conservatively since she is eating well, symptoms are resolved and LFTs normalized, but will d/w MD and report definitive plan. Thanks for consult Brayton El PA-C, for Dr. Johna Sheriff.   Marianna Fuss PA-C 02/01/2012, 9:55 AM

## 2012-02-02 DIAGNOSIS — K8020 Calculus of gallbladder without cholecystitis without obstruction: Secondary | ICD-10-CM | POA: Diagnosis present

## 2012-02-02 NOTE — Progress Notes (Signed)
Physical Therapy Treatment Patient Details Name: Vicki Velasquez MRN: [id number] DOB: [birth year] Today's Date: 02/02/2012  1147-1200 TA  PT Assessment/Plan  PT - Assessment/Plan Comments on Treatment Session: Pt requires +2 assist for all mobilty.  Did well with getting EOB, however standing/transfers required increased assist and cuing from therapist in order to complete.  PT continues to recommend SNF for pt at [facility].  PT Plan: Discharge plan remains appropriate PT Frequency: Min 3X/week Follow Up Recommendations: Skilled nursing facility Equipment Recommended: Defer to next venue PT Goals  Acute Rehab PT Goals PT Goal Formulation: With patient Time For Goal Achievement: 2 weeks Pt will go Supine/Side to Sit: with mod assist PT Goal: Supine/Side to Sit - Progress: Progressing toward goal Pt will go Sit to Stand: with mod assist PT Goal: Sit to Stand - Progress: Not progressing (see note) Pt will go Stand to Sit: with mod assist PT Goal: Stand to Sit - Progress: Not progressing (see note) Pt will Transfer Bed to Chair/Chair to Bed: with mod assist PT Transfer Goal: Bed to Chair/Chair to Bed - Progress: Not progressing (see note)  PT Treatment Precautions/Restrictions  Precautions Precautions: Posterior Hip Precaution Comments: Pt instructed on THP as pt recalled only 1/3 Required Braces or Orthoses: No Restrictions Weight Bearing Restrictions: Yes LLE Weight Bearing: Partial weight bearing LLE Partial Weight Bearing Percentage or Pounds: 25-50% Mobility (including Balance) Bed Mobility Bed Mobility: Yes Supine to Sit: 1: +2 Total assist;Patient percentage (comment);HOB elevated (Comment degrees) Supine to Sit Details (indicate cue type and reason): Pt assist 50%.  Requires assist for LE and some assist with trunk to get EOB with cues for hand placement.  Sitting - Scoot to Edge of Bed: 1: +2 Total assist;Patient percentage (comment) Sitting - Scoot to Edge of Bed Details  (indicate cue type and reason): Pt assist 30%.  Used pad to assist pt to EOB with cues for technique.  Transfers Transfers: Yes Sit to Stand: 1: +2 Total assist;Patient percentage (comment);With upper extremity assist;From bed Sit to Stand Details (indicate cue type and reason): Pt assist 15%.  Cues for PWB, hand placement and for glute activation for upright posture.  (Performed x 2 from bed and 3in1) Stand to Sit: 1: +2 Total assist;Patient percentage (comment);With armrests;To chair/3-in-1 Stand to Sit Details: Pt assist 0%.  +2 assist for controlled descent due to pt lacking glute strength for upright posture.  Tactile and verbal cues for hand placement and LE management.  Stand Pivot Transfers: 1: +2 Total assist Stand Pivot Transfer Details (indicate cue type and reason): Pt assist 20%.  +2 assist with cues for sequencing/technique with RW and to maintain PWB precautions.  Continued to require cues for glute activation for upright posture.   Ambulation/Gait Ambulation/Gait: No    Exercise    End of Session PT - End of Session Equipment Utilized During Treatment: Gait belt Activity Tolerance: Patient limited by fatigue;Patient limited by pain Patient left: in chair;with call bell in reach Nurse Communication: Mobility status for transfers General Behavior During Session: [facility] for tasks performed Cognition: [facility] for tasks performed  Page, Meribeth Mattes 02/02/2012, 1:11 PM

## 2012-02-02 NOTE — Progress Notes (Signed)
Subjective:  Abdominal discomfort resolved. Nausea and vommitting gone. She tolerated her breakfast.  Objective: Filed Vitals:   02/02/12 0000 02/02/12 0400 02/02/12 0604 02/02/12 0929  BP:   140/85 111/61  Pulse:   76 79  Temp:   98.1 F (36.7 C)   TempSrc:      Resp: 16 14 16    Height:      Weight:      SpO2: 97% 97% 98%    Weight change:   Intake/Output Summary (Last 24 hours) at 02/02/12 1038 Last data filed at 02/02/12 0605  Gross per 24 hour  Intake    450 ml  Output      0 ml  Net    450 ml    General: Alert, awake, oriented x3, in no acute distress.  HEENT: No bruits, no goiter.  Heart: Regular rate and rhythm, without murmurs, rubs, gallops.  Lungs:good air movement cta b/l. Abdomen: Soft, nontender, nondistended, positive bowel sounds.  Neuro: Grossly intact, nonfocal.   Lab Results:  Basename 02/01/12 0420 01/31/12 1120  NA 136 136  K 3.5 3.8  CL 102 99  CO2 25 25  GLUCOSE 110* 150*  BUN 24* 28*  CREATININE 0.97 0.98  CALCIUM 9.1 9.6  MG -- --  PHOS -- --    Basename 02/01/12 0420 01/31/12 1120  AST 73* 142*  ALT 63* 84*  ALKPHOS 246* 318*  BILITOT 1.5* 3.9*  PROT 5.0* 5.9*  ALBUMIN 2.1* 2.5*   No results found for this basename: LIPASE:2,AMYLASE:2 in the last 72 hours  Basename 02/01/12 0420 01/31/12 0428  WBC 10.1 11.4*  NEUTROABS -- --  HGB 8.2* 8.6*  HCT 24.5* 25.5*  MCV 90.4 90.1  PLT 160 145*    Basename 02/01/12 0420 01/31/12 2025 01/31/12 1429  CKTOTAL 147 221* 204*  CKMB 4.1* 5.0* 4.5*  CKMBINDEX -- -- --  TROPONINI <0.30 <0.30 <0.30   Micro Results: Recent Results (from the past 240 hour(s))  URINE CULTURE     Status: Normal   Collection Time   01/27/12  4:55 AM      Component Value Range Status Comment   Specimen Description URINE, CATHETERIZED   Final    Special Requests NONE   Final    Culture  Setup Time [id number]   Final    Colony Count NO GROWTH   Final    Culture NO GROWTH   Final    Report Status  01/28/2012 FINAL   Final   SURGICAL PCR SCREEN     Status: Normal   Collection Time   01/27/12  9:32 PM      Component Value Range Status Comment   MRSA, PCR NEGATIVE  NEGATIVE  Final    Staphylococcus aureus NEGATIVE  NEGATIVE  Final     Studies/Results: US Abdomen Complete  01/31/2012  *RADIOLOGY REPORT*  Clinical Data:  Nausea, elevated LFTs, hypertension.  COMPLETE ABDOMINAL ULTRASOUND  Comparison:  09/19/2008  Findings:  Gallbladder:  There is a mobile 2.5 cm stone in the fundus of the gallbladder.  There is sludge in the gallbladder.  Gallbladder wall thickening up to 3.4 mm.  No pericholecystic fluid.  Sonographer reports no sonographic Murphy's sign.  Common bile duct:   7 mm diameter, unremarkable  Liver:  No focal lesion identified.  Within normal limits in parenchymal echogenicity.  IVC:  Appears normal.  Pancreas:  No focal abnormality seen.  Spleen:  6.2 cm craniocaudal length, unremarkable.  Right Kidney:  9.9 cm.  No hydronephrosis.  Well-preserved cortex. Normal size and parenchymal echotexture without focal abnormalities.  Left Kidney:  11.3 cm in length.  There is a 33 x 41 x 44 mm simple appearing cyst in the interpolar region.  No hydronephrosis or solid renal mass.  Abdominal aorta:  No aneurysm identified.  IMPRESSION:  1.  Cholelithiasis and mild gallbladder wall thickening. 2.  4.4 cm left renal cyst.  Original Report Authenticated By: Osa Craver, M.D.    Medications: I have reviewed the patient's current medications.  Assessment and plan: Transaminits: - Abdominal discomfort resolved. She probably passed a stone. LFT's down, billi down. U/s mild gallbladder thickening. Surgery consulted recommended conservative management. If tolerates diet can be d/c. -Tolerating diet. -d/c zosyn.   LOS: 6 days   Marinda Elk M.D. Pager: [phone] [facility] 02/02/2012, 10:38 AM

## 2012-02-02 NOTE — Progress Notes (Signed)
RN informed this CSW that pt should be ready for discharge on Sunday, 02/03/12. CSW confirmed with [facility] SNF that they are able to accept pt on Sun, 2/3. CSW will continue to follow to facilitate discharge to [facility].   Dede Query, MSW, [facility] Weekend Coverage [phone]

## 2012-02-02 NOTE — Progress Notes (Signed)
5 Days Post-Op  Subjective: Denies any nausea or abdominal pain today.  Asking for breakfast  Objective: Vital signs in last 24 hours: Temp:  [97.9 F (36.6 C)-98.1 F (36.7 C)] 98.1 F (36.7 C) (02/02 0604) Pulse Rate:  [76-87] 76  (02/02 0604) Resp:  [14-16] 16  (02/02 0604) BP: (107-148)/(51-85) 140/85 mmHg (02/02 0604) SpO2:  [96 %-98 %] 98 % (02/02 0604) Last BM Date: 02/01/12  Intake/Output from previous day: 02/01 0701 - 02/02 0700 In: 690 [P.O.:540; IV Piggyback:150] Out: -  Intake/Output this shift:    General appearance: alert, cooperative and no distress GI: soft, non-tender; bowel sounds normal; no masses,  no organomegaly  Lab Results:   [facility] 02/01/12 0420 01/31/12 0428  WBC 10.1 11.4*  HGB 8.2* 8.6*  HCT 24.5* 25.5*  PLT 160 145*   BMET  Basename 02/01/12 0420 01/31/12 1120  NA 136 136  K 3.5 3.8  CL 102 99  CO2 25 25  GLUCOSE 110* 150*  BUN 24* 28*  CREATININE 0.97 0.98  CALCIUM 9.1 9.6   PT/INR No results found for this basename: LABPROT:2,INR:2 in the last 72 hours ABG No results found for this basename: PHART:2,PCO2:2,PO2:2,HCO3:2 in the last 72 hours  Studies/Results: US Abdomen Complete  01/31/2012  *RADIOLOGY REPORT*  Clinical Data:  Nausea, elevated LFTs, hypertension.  COMPLETE ABDOMINAL ULTRASOUND  Comparison:  09/19/2008  Findings:  Gallbladder:  There is a mobile 2.5 cm stone in the fundus of the gallbladder.  There is sludge in the gallbladder.  Gallbladder wall thickening up to 3.4 mm.  No pericholecystic fluid.  Sonographer reports no sonographic Murphy's sign.  Common bile duct:   7 mm diameter, unremarkable  Liver:  No focal lesion identified.  Within normal limits in parenchymal echogenicity.  IVC:  Appears normal.  Pancreas:  No focal abnormality seen.  Spleen:  6.2 cm craniocaudal length, unremarkable.  Right Kidney:  9.9 cm. No hydronephrosis.  Well-preserved cortex. Normal size and parenchymal echotexture without focal  abnormalities.  Left Kidney:  11.3 cm in length.  There is a 33 x 41 x 44 mm simple appearing cyst in the interpolar region.  No hydronephrosis or solid renal mass.  Abdominal aorta:  No aneurysm identified.  IMPRESSION:  1.  Cholelithiasis and mild gallbladder wall thickening. 2.  4.4 cm left renal cyst.  Original Report Authenticated By: Osa Craver, M.D.    Anti-infectives: Anti-infectives     Start     Dose/Rate Route Frequency Ordered Stop   01/31/12 1430   piperacillin-tazobactam (ZOSYN) IVPB 3.375 g        3.375 g 12.5 mL/hr over 240 Minutes Intravenous 3 times per day 01/31/12 1310     01/28/12 2200   ceFAZolin (ANCEF) IVPB 1 g/50 mL premix     Comments: Preoperative antibiotic.      1 g 100 mL/hr over 30 Minutes Intravenous 3 times per day 01/28/12 1827 01/29/12 1526   01/28/12 1830   ceFAZolin (ANCEF) IVPB 1 g/50 mL premix  Status:  Discontinued        1 g 100 mL/hr over 30 Minutes Intravenous Every 6 hours 01/28/12 1827 01/28/12 1846   01/28/12 1300   ceFAZolin (ANCEF) IVPB 1 g/50 mL premix  Status:  Discontinued     Comments: Preoperative antibiotic.      1 g 100 mL/hr over 30 Minutes Intravenous  Once 01/27/12 2119 01/28/12 1827   01/28/12 0000   ceFAZolin (ANCEF) IVPB 1 g/50 mL premix  Status:  Discontinued        1 g 100 mL/hr over 30 Minutes Intravenous  Once 01/27/12 0614 01/27/12 2119          Assessment/Plan: s/p Procedure(s): ARTHROPLASTY BIPOLAR HIP No labs today but clinically feels well, remains AF and HD stable and no sign of cholecystitis or apparent complications from gallstones.  If LFT's continue to trend down and remains asymptomatic, would recommend outpatient follow up.  LOS: 6 days    Vicki Velasquez 02/02/2012

## 2012-02-02 NOTE — Progress Notes (Signed)
PT signing off on pt per MD request.  Thanks, Clovia Cuff, PT

## 2012-02-03 NOTE — Progress Notes (Signed)
Pt set to discharge to [facility] today.  Pt and son are aware.  Non-emergency transportation called and notified of discharge and transport to [facility].   Tamala Julian, MSW, LCSW Clinical Social Worker Weekend coverage [phone]

## 2012-02-03 NOTE — Progress Notes (Signed)
Patient stable; discharged to [facility].  Transported by [employer] service.

## 2012-02-05 ENCOUNTER — Other Ambulatory Visit (HOSPITAL_COMMUNITY): Payer: Self-pay | Admitting: *Deleted

## 2012-02-13 ENCOUNTER — Ambulatory Visit (HOSPITAL_COMMUNITY)
Admission: RE | Admit: 2012-02-13 | Discharge: 2012-02-13 | Disposition: A | Discharge status: Home / Self Care | Payer: Medicare Other | Source: Ambulatory Visit | Attending: Internal Medicine | Admitting: Internal Medicine

## 2012-02-13 DIAGNOSIS — R1311 Dysphagia, oral phase: Secondary | ICD-10-CM | POA: Insufficient documentation

## 2012-02-13 DIAGNOSIS — I517 Cardiomegaly: Secondary | ICD-10-CM | POA: Insufficient documentation

## 2012-02-13 DIAGNOSIS — E039 Hypothyroidism, unspecified: Secondary | ICD-10-CM | POA: Insufficient documentation

## 2012-02-13 DIAGNOSIS — I4891 Unspecified atrial fibrillation: Secondary | ICD-10-CM | POA: Insufficient documentation

## 2012-02-13 DIAGNOSIS — Z79899 Other long term (current) drug therapy: Secondary | ICD-10-CM | POA: Insufficient documentation

## 2012-02-13 DIAGNOSIS — I10 Essential (primary) hypertension: Secondary | ICD-10-CM | POA: Insufficient documentation

## 2012-02-13 DIAGNOSIS — R042 Hemoptysis: Secondary | ICD-10-CM | POA: Insufficient documentation

## 2012-02-13 DIAGNOSIS — R6889 Other general symptoms and signs: Secondary | ICD-10-CM | POA: Insufficient documentation

## 2012-02-13 NOTE — Procedures (Signed)
Modified Barium Swallow Procedure Note Patient Details  Name: Vicki Velasquez MRN: [id number] Date of Birth: 1916-04-19  Today's Date: 02/13/2012 Time: 1100 - 1145   Past Medical History:  Past Medical History  Diagnosis Date  . Atrial flutter   . Hypothyroidism   . Hypertension   . Edema     Leg  . Atrial fibrillation     not on coumadin-managed on meciaitons.  . SOB (shortness of breath) 12/16/10    At rest-no oxygen at rest  . Chronic lung disease     Stable  . Cardiomegaly     Stable  . Disc disease, degenerative, lumbar or lumbosacral     Dr. Dyane Dustman [facility]-Last Steroids 5 yr ago   Past Surgical History:  Past Surgical History  Procedure Date  . Aortic valve replacement     2002 replaced with a pig vavle.    . Tumour     had surgery ion the 70's for it   . Hip arthroplasty 01/28/2012    Procedure: ARTHROPLASTY BIPOLAR HIP;  Surgeon: Javier Docker, MD;  Location: [facility] ORS;  Service: Orthopedics;  Laterality: Left;  . Partial hysterectomy 1961  . Appendectomy 1961   HPI:  Pt is a 78 yo resident of [facility] [facility] referred for MBS due to concerns pt may be aspirating.  Per son, pt has been a resident of [facility] for one week.  PMH + for fall, left hip hemiarthroplasty, left femoral neck fx, chronic lung disease, hypothyroidism, dyspnea, factitions cardimegaly, disc disease *lumbar or lumbosacral, hypertension.  Pt medication list includes Zantac and Prilosec-which pt states she has taken for a "long time".  Pt reports sensation of food lodging in throat, right side requiring her to dry swallow to clear, pt does not recall if trying liquids helps to clear.  Coughing up blood tinged secretions reported x1 week per pt and observed today.  Unintentional weight loss and dehydration issue noted that resolved at hospital 01/30/12 per referral form.  Diet downgraded to mechanical soft at SNF.  Pt denies ever having a swallow eval completed in the past.  Son, who was present today,  reports pt's left facial/labial decr movement is normal for her, as long as he could recall.  Pt also with constipation issues, as she has medication ordered to assist with this issue. Pt reports problems swallowing pills and requests to take them with applesauce, but states she gets them with water.       Recommendation/Prognosis  Clinical Impression Dysphagia Diagnosis: Suspected primary esophageal dysphagia;Mild oral phase dysphagia Clinical impression: Pt appears with mild oral dysphagia mostly characterized by discoordination and oral delay and suspected primary esophageal dysphagia.  Pt did not aspirate or penetrate with any consistency.  Pt complained of sensation of pharyngeal stasis, when residuals appeared at distal to mid-esophagus (worse with incr viscocity of material), suspect vagal nerve referrant sensation.  Thin barium appeared to faciliate clearance of pudding and cracker, but did appear to backflow to mid-esophagus.  Findings appear consistent with esophageal dysmotility, radiologist was not present to confirm findings.    Recommend consider dedicated esophageal evaluation, but in the interim recommend pt alternate solids and liquids to aid esophageal clearance and consume several bland meals/day.     Swallow Evaluation Recommendations Recommended Consults: Consider esophageal assessment Solid Consistency: Dysphagia 3 (Mechanical soft) Liquid Consistency: Thin Medication Administration: Other (Comment) (meds whole with puree, start with and follow with liquids) Supervision: Patient able to self feed Compensations: Slow rate;Small sips/bites;Follow solids  with liquid Postural Changes and/or Swallow Maneuvers: Seated upright 90 degrees;Upright 30-60 min after meal Oral Care Recommendations: Oral care QID Follow up Recommendations: Skilled Nursing facility   Individuals Consulted Consulted and Agree with Results and Recommendations: Patient;Family member/caregiver Report Sent to  : Facility (Comment);Primary SLP ([facility])  General:  Date of Onset: 02/13/12 HPI: Pt is a 78 yo resident of [facility] referred for MBS due to concerns pt may be aspirating.  Per son, pt has been a resident of [facility] for one week.  PMH + for fall, left hip hemiarthroplasty, left femoral neck fx, chronic lung disease, hypothyroidism, dyspnea, factitions cardimegaly, disc disease *lumbar or lumbosacral, hypertension.  Pt medication list includes Zantac and Prilosec-which pt states she has taken for a "long time".  Pt reports sensation of food lodging in throat, right side requiring her to dry swallow to clear, pt does not recall if trying liquids helps to clear.  Coughing up blood tinged secretions reported x1 week per pt and observed today.  Unintentional weight loss and dehydration issue noted that resolved at hospital 01/30/12 per referral form.  Diet downgraded to mechanical soft at SNF.  Pt denies ever having a swallow eval completed in the past.  Son, who was present today, reports pt's left facial/labial decr movement is normal for her, as long as he could recall.  Pt also with constipation issues, as she has medication ordered to assist with this issue. Pt reports problems swallowing pills and requests to take them with applesauce, but states she gets them with water.   Type of Study: Initial MBS Behavior/Cognition: Alert;Cooperative Oral Cavity - Dentition: Adequate natural dentition Oral Motor / Sensory Function: Impaired motor Oral impairment: Left facial;Left labial Vision: Functional for self-feeding Patient Positioning: Upright in chair Baseline Vocal Quality: Clear Volitional Cough: Strong Volitional Swallow: Able to elicit Anatomy: Within functional limits Pharyngeal Secretions: Not observed secondary MBS  Reason for Referral:  Concern for aspiration  Oral Phase Oral Preparation/Oral Phase Oral Phase: Impaired Oral - Thin Oral - Thin Cup: Holding of bolus;Delayed oral  transit Oral - Solids Oral - Puree: Holding of bolus;Delayed oral transit;Piecemeal swallowing Oral - Regular: Holding of bolus;Delayed oral transit;Piecemeal swallowing Oral - Pill: Holding of bolus;Other (Comment);Weak lingual manipulation;Lingual/palatal residue;Delayed oral transit (ba tablet stasis at tongue base, pt expectorated per slp cue) Oral Phase - Comment Oral Phase - Comment: Mild delay in oral transiting, up to 2 seconds with liquids, ? if this is compensatory, mild discoordination orally.  Pharyngeal Phase  Pharyngeal Phase Pharyngeal Phase: Within functional limits Cervical Esophageal Phase  Cervical Esophageal Phase Cervical Esophageal Phase: Impaired Cervical Esophageal Phase - Thin Thin Cup: Prominent cricopharyngeal segment Cervical Esophageal Phase - Comment Cervical Esophageal Comment: Appearance of prominent cp present that did not impair barium flow.    Donavan Burnet, MS [facility] SLP [phone]

## 2012-04-21 ENCOUNTER — Encounter (HOSPITAL_BASED_OUTPATIENT_CLINIC_OR_DEPARTMENT_OTHER): Payer: Medicare Other | Attending: Internal Medicine

## 2012-04-21 DIAGNOSIS — K219 Gastro-esophageal reflux disease without esophagitis: Secondary | ICD-10-CM | POA: Insufficient documentation

## 2012-04-21 DIAGNOSIS — Z954 Presence of other heart-valve replacement: Secondary | ICD-10-CM | POA: Insufficient documentation

## 2012-04-21 DIAGNOSIS — E039 Hypothyroidism, unspecified: Secondary | ICD-10-CM | POA: Insufficient documentation

## 2012-04-21 DIAGNOSIS — L8990 Pressure ulcer of unspecified site, unspecified stage: Secondary | ICD-10-CM | POA: Insufficient documentation

## 2012-04-21 DIAGNOSIS — I10 Essential (primary) hypertension: Secondary | ICD-10-CM | POA: Insufficient documentation

## 2012-04-21 DIAGNOSIS — Z79899 Other long term (current) drug therapy: Secondary | ICD-10-CM | POA: Insufficient documentation

## 2012-04-21 DIAGNOSIS — E785 Hyperlipidemia, unspecified: Secondary | ICD-10-CM | POA: Insufficient documentation

## 2012-04-21 DIAGNOSIS — I4891 Unspecified atrial fibrillation: Secondary | ICD-10-CM | POA: Insufficient documentation

## 2012-04-21 DIAGNOSIS — L89609 Pressure ulcer of unspecified heel, unspecified stage: Secondary | ICD-10-CM | POA: Insufficient documentation

## 2012-04-21 DIAGNOSIS — I739 Peripheral vascular disease, unspecified: Secondary | ICD-10-CM | POA: Insufficient documentation

## 2012-04-21 DIAGNOSIS — Z96649 Presence of unspecified artificial hip joint: Secondary | ICD-10-CM | POA: Insufficient documentation

## 2012-04-21 NOTE — Progress Notes (Signed)
[facility]  NAME:  Vicki Velasquez, Vicki Velasquez              ACCOUNT NO.:  [id number]  MEDICAL RECORD NO.:  [id number]      DATE OF BIRTH:  1916/02/23  PHYSICIAN:  Jonelle Sports. Sandon Yoho, M.D.  VISIT DATE:  04/21/2012                                  OFFICE VISIT   HISTORY:  This 78 year old white female is seen for evaluation and treatment of a left heel ulcer.  The patient several months ago sustained a fall in which she fractured her left hip and required a partial replacement.  This was done in February 2013.  Apparently during that hospitalization or convalescence, she developed a pressure ulcer on her posterior left heel, and this has not progressed healing.  She currently is being cared for at home with a caregiver, and they have been very good about keeping the cath elevated so this heel does not come in contact with the bed clothes etc. and also she has been proceeding with physical therapy which allows her to walk which I think is probably satisfactory because her ulcers on the posterior aspect of the heel.  She does this without shoes, so there is no question of footwear putting additional pressure on her heel.  She is here now for our evaluation and advice to see if we can make any recommendations that would get this better healed.  PAST MEDICAL HISTORY:  Includes as: 1. Surgeries on aortic valve replacement in 2000. 2. A benign hemangioma of the brain, right parietal area in 1991. 3. Hysterectomy in the remote past. 4. Cataract removals bilaterally several years ago. 5. Left hip replacement as indicated in the present illness.  HOSPITALIZATIONS:  She has had medical hospitalizations in association with atrial fibrillation and gastric ulcer disease.  MEDICATIONS:  Her regular medications include: 1. Cymbalta 30 mg daily. 2. Levoxyl 75 mg daily. 3. Aspirin 325 mg daily. 4. Centrum 1 tab daily. 5. Senokot 2 tabs daily. 6. Vitamin D 2000 international units  daily. 7. Omeprazole 20 mg daily. 8. Metoprolol 50 mg daily. 9. Ranitidine 75 mg daily. 10.Losartan/HCTZ 120/25 one daily. 11.Oxycodone 5 mg 1 every 6 hours as needed.  ALLERGIES:  She has medicinal allergies to: 1. ACCUPRIL. 2. CODEINE. 3. FENTANYL. 4. LEXAPRO. 5. TAGAMET. 6. MONISTAT.  FAMILY HISTORY:  Not known in any detail, apparently was notable for longevity, and there was a history of hypertension in the family but no other diseases known that would have any bearing on the patient's situation.  PERSONAL HISTORY:  The patient apparently still prior to this illness was able to live independently, now has caretaker as indicated.  She has a son who lives nearby and also a sister who is contributory to her oversight.  REVIEW OF SYSTEMS:  Her history includes hypertension, uncomplicated atrial fibrillation, hypothyroidism, hyperlipidemia, hiatal hernia, osteoarthritis, gastroesophageal reflux, vitamin D deficiency, previous gastric ulcer.  None of these problems are particularly active at this time, being controlled nicely by the medications as indicated above.  PHYSICAL EXAMINATION:  VITAL SIGNS: Blood pressure is 145/80, pulse 62 and slightly irregular, respirations 18, temperature 97.8. GENERAL: She is alert, cooperative, pleasant, elderly female, who appears her stated age and is in no distress. SKIN: Her skin color is good as is its texture and turgor. HEENT: Her mucous membranes  are moist and pink.  Her neck veins are flat with the patient down at about 20 degrees elevation. CHEST: Grossly clear.  The heart shows the pattern of atrial fibrillation, but I cannot hear the murmur of her prosthetic valve. ABDOMEN: Protuberant but soft and without organomegaly or masses. EXTREMITIES: Free of edema.  Distal pulses are not palpable.  On her left posterior heel, is an ulceration measuring 3.1 x 1.0 x 0.1 cm, that is covered by soft eschar.  There is no surrounding erythema  or no drainage and no odor.  IMPRESSION:  Pressure ulcer, posterior left heel and extremity compromised by peripheral arterial disease.  DISPOSITION:  Because this is a soft and not hard eschar, I have today removed a portion of it without particular discomfort to the patient and will then proceed with the use of Santyl and Medihoney in an effort to see if this we can clean up this ulcer and then hopefully get some way healed.  I have discussed with the patient and her family the fact that her arterial circulation is greatly compromised and that we will not given her age and overall circumstances pursue this unless she fails to heal or worsens.  I have also placed her today in a pressure relief heel boot.  Her followup visit will be in 1 week.         ______________________________ Jonelle Sports. Cheryll Cockayne, M.D.    RES/MEDQ  D:  04/21/2012  T:  04/21/2012  Job:  [id number]

## 2012-04-24 ENCOUNTER — Ambulatory Visit (INDEPENDENT_AMBULATORY_CARE_PROVIDER_SITE_OTHER): Payer: Medicare Other | Admitting: *Deleted

## 2012-04-24 DIAGNOSIS — L97509 Non-pressure chronic ulcer of other part of unspecified foot with unspecified severity: Secondary | ICD-10-CM

## 2012-05-05 ENCOUNTER — Encounter (HOSPITAL_BASED_OUTPATIENT_CLINIC_OR_DEPARTMENT_OTHER): Payer: Medicare Other | Attending: Internal Medicine

## 2012-05-05 DIAGNOSIS — Z79899 Other long term (current) drug therapy: Secondary | ICD-10-CM | POA: Insufficient documentation

## 2012-05-05 DIAGNOSIS — E039 Hypothyroidism, unspecified: Secondary | ICD-10-CM | POA: Insufficient documentation

## 2012-05-05 DIAGNOSIS — I739 Peripheral vascular disease, unspecified: Secondary | ICD-10-CM | POA: Insufficient documentation

## 2012-05-05 DIAGNOSIS — L8990 Pressure ulcer of unspecified site, unspecified stage: Secondary | ICD-10-CM | POA: Insufficient documentation

## 2012-05-05 DIAGNOSIS — I10 Essential (primary) hypertension: Secondary | ICD-10-CM | POA: Insufficient documentation

## 2012-05-05 DIAGNOSIS — K219 Gastro-esophageal reflux disease without esophagitis: Secondary | ICD-10-CM | POA: Insufficient documentation

## 2012-05-05 DIAGNOSIS — I4891 Unspecified atrial fibrillation: Secondary | ICD-10-CM | POA: Insufficient documentation

## 2012-05-05 DIAGNOSIS — L89609 Pressure ulcer of unspecified heel, unspecified stage: Secondary | ICD-10-CM | POA: Insufficient documentation

## 2012-05-05 DIAGNOSIS — E785 Hyperlipidemia, unspecified: Secondary | ICD-10-CM | POA: Insufficient documentation

## 2012-05-13 DIAGNOSIS — H6120 Impacted cerumen, unspecified ear: Secondary | ICD-10-CM

## 2012-05-13 HISTORY — DX: Impacted cerumen, unspecified ear: H61.20

## 2012-06-02 ENCOUNTER — Encounter (HOSPITAL_BASED_OUTPATIENT_CLINIC_OR_DEPARTMENT_OTHER): Payer: Medicare Other

## 2012-06-04 ENCOUNTER — Encounter (HOSPITAL_BASED_OUTPATIENT_CLINIC_OR_DEPARTMENT_OTHER): Payer: Medicare Other | Attending: General Surgery

## 2012-06-04 DIAGNOSIS — L89609 Pressure ulcer of unspecified heel, unspecified stage: Secondary | ICD-10-CM | POA: Insufficient documentation

## 2012-06-04 DIAGNOSIS — Z79899 Other long term (current) drug therapy: Secondary | ICD-10-CM | POA: Insufficient documentation

## 2012-06-04 DIAGNOSIS — I10 Essential (primary) hypertension: Secondary | ICD-10-CM | POA: Insufficient documentation

## 2012-06-04 DIAGNOSIS — I739 Peripheral vascular disease, unspecified: Secondary | ICD-10-CM | POA: Insufficient documentation

## 2012-06-04 DIAGNOSIS — L8990 Pressure ulcer of unspecified site, unspecified stage: Secondary | ICD-10-CM | POA: Insufficient documentation

## 2012-06-12 ENCOUNTER — Other Ambulatory Visit: Payer: Self-pay | Admitting: Nurse Practitioner

## 2012-06-12 ENCOUNTER — Ambulatory Visit
Admission: RE | Admit: 2012-06-12 | Discharge: 2012-06-12 | Disposition: A | Discharge status: Home / Self Care | Payer: Medicare Other | Source: Ambulatory Visit | Attending: Nurse Practitioner | Admitting: Nurse Practitioner

## 2012-06-12 DIAGNOSIS — R0989 Other specified symptoms and signs involving the circulatory and respiratory systems: Secondary | ICD-10-CM

## 2012-06-12 DIAGNOSIS — R05 Cough: Secondary | ICD-10-CM

## 2012-06-26 ENCOUNTER — Ambulatory Visit (HOSPITAL_COMMUNITY): Admission: RE | Admit: 2012-06-26 | Payer: Medicare Other | Source: Ambulatory Visit

## 2012-06-26 ENCOUNTER — Other Ambulatory Visit (HOSPITAL_COMMUNITY): Payer: Medicare Other

## 2012-07-02 ENCOUNTER — Ambulatory Visit
Admission: RE | Admit: 2012-07-02 | Discharge: 2012-07-02 | Disposition: A | Discharge status: Home / Self Care | Payer: Medicare Other | Source: Ambulatory Visit | Attending: Internal Medicine | Admitting: Internal Medicine

## 2012-07-02 ENCOUNTER — Other Ambulatory Visit: Payer: Self-pay | Admitting: Internal Medicine

## 2012-07-02 DIAGNOSIS — J90 Pleural effusion, not elsewhere classified: Secondary | ICD-10-CM

## 2012-07-09 ENCOUNTER — Encounter (HOSPITAL_BASED_OUTPATIENT_CLINIC_OR_DEPARTMENT_OTHER): Payer: Medicare Other | Attending: General Surgery

## 2012-07-09 DIAGNOSIS — L8992 Pressure ulcer of unspecified site, stage 2: Secondary | ICD-10-CM | POA: Insufficient documentation

## 2012-07-09 DIAGNOSIS — L89609 Pressure ulcer of unspecified heel, unspecified stage: Secondary | ICD-10-CM | POA: Insufficient documentation

## 2012-08-13 ENCOUNTER — Encounter (HOSPITAL_BASED_OUTPATIENT_CLINIC_OR_DEPARTMENT_OTHER): Payer: Medicare Other

## 2012-08-20 ENCOUNTER — Encounter (HOSPITAL_BASED_OUTPATIENT_CLINIC_OR_DEPARTMENT_OTHER): Payer: Medicare Other | Attending: General Surgery

## 2012-08-20 DIAGNOSIS — L8992 Pressure ulcer of unspecified site, stage 2: Secondary | ICD-10-CM | POA: Insufficient documentation

## 2012-08-20 DIAGNOSIS — L89609 Pressure ulcer of unspecified heel, unspecified stage: Secondary | ICD-10-CM | POA: Insufficient documentation

## 2012-09-10 ENCOUNTER — Encounter (HOSPITAL_BASED_OUTPATIENT_CLINIC_OR_DEPARTMENT_OTHER): Payer: Medicare Other | Attending: General Surgery

## 2012-09-10 DIAGNOSIS — L8992 Pressure ulcer of unspecified site, stage 2: Secondary | ICD-10-CM | POA: Insufficient documentation

## 2012-09-10 DIAGNOSIS — L89609 Pressure ulcer of unspecified heel, unspecified stage: Secondary | ICD-10-CM | POA: Insufficient documentation

## 2012-10-01 ENCOUNTER — Encounter (HOSPITAL_BASED_OUTPATIENT_CLINIC_OR_DEPARTMENT_OTHER): Payer: Medicare Other | Attending: General Surgery

## 2012-10-01 DIAGNOSIS — L89609 Pressure ulcer of unspecified heel, unspecified stage: Secondary | ICD-10-CM | POA: Insufficient documentation

## 2012-10-01 DIAGNOSIS — L8992 Pressure ulcer of unspecified site, stage 2: Secondary | ICD-10-CM | POA: Insufficient documentation

## 2012-10-08 ENCOUNTER — Encounter (HOSPITAL_BASED_OUTPATIENT_CLINIC_OR_DEPARTMENT_OTHER): Payer: Medicare Other

## 2012-12-10 ENCOUNTER — Encounter (HOSPITAL_BASED_OUTPATIENT_CLINIC_OR_DEPARTMENT_OTHER): Payer: Medicare Other | Attending: General Surgery

## 2012-12-10 DIAGNOSIS — I739 Peripheral vascular disease, unspecified: Secondary | ICD-10-CM | POA: Insufficient documentation

## 2012-12-10 DIAGNOSIS — L89609 Pressure ulcer of unspecified heel, unspecified stage: Secondary | ICD-10-CM | POA: Insufficient documentation

## 2012-12-10 DIAGNOSIS — L8992 Pressure ulcer of unspecified site, stage 2: Secondary | ICD-10-CM | POA: Insufficient documentation

## 2012-12-10 DIAGNOSIS — I998 Other disorder of circulatory system: Secondary | ICD-10-CM | POA: Insufficient documentation

## 2012-12-10 DIAGNOSIS — M79609 Pain in unspecified limb: Secondary | ICD-10-CM | POA: Insufficient documentation

## 2013-01-07 ENCOUNTER — Encounter (HOSPITAL_BASED_OUTPATIENT_CLINIC_OR_DEPARTMENT_OTHER): Payer: Medicare Other

## 2013-03-04 DIAGNOSIS — L600 Ingrowing nail: Secondary | ICD-10-CM

## 2013-03-04 HISTORY — DX: Ingrowing nail: L60.0

## 2013-03-30 ENCOUNTER — Encounter (HOSPITAL_BASED_OUTPATIENT_CLINIC_OR_DEPARTMENT_OTHER): Payer: Medicare Other | Attending: General Surgery

## 2013-03-30 DIAGNOSIS — Z96659 Presence of unspecified artificial knee joint: Secondary | ICD-10-CM | POA: Insufficient documentation

## 2013-03-30 DIAGNOSIS — Z86011 Personal history of benign neoplasm of the brain: Secondary | ICD-10-CM | POA: Insufficient documentation

## 2013-03-30 DIAGNOSIS — E039 Hypothyroidism, unspecified: Secondary | ICD-10-CM | POA: Insufficient documentation

## 2013-03-30 DIAGNOSIS — Z96649 Presence of unspecified artificial hip joint: Secondary | ICD-10-CM | POA: Insufficient documentation

## 2013-03-30 DIAGNOSIS — L8993 Pressure ulcer of unspecified site, stage 3: Secondary | ICD-10-CM | POA: Insufficient documentation

## 2013-03-30 DIAGNOSIS — Z954 Presence of other heart-valve replacement: Secondary | ICD-10-CM | POA: Insufficient documentation

## 2013-03-30 DIAGNOSIS — I10 Essential (primary) hypertension: Secondary | ICD-10-CM | POA: Insufficient documentation

## 2013-03-30 DIAGNOSIS — L89609 Pressure ulcer of unspecified heel, unspecified stage: Secondary | ICD-10-CM | POA: Insufficient documentation

## 2013-03-30 DIAGNOSIS — Z79899 Other long term (current) drug therapy: Secondary | ICD-10-CM | POA: Insufficient documentation

## 2013-03-30 DIAGNOSIS — Z7982 Long term (current) use of aspirin: Secondary | ICD-10-CM | POA: Insufficient documentation

## 2013-03-30 DIAGNOSIS — I4891 Unspecified atrial fibrillation: Secondary | ICD-10-CM | POA: Insufficient documentation

## 2013-03-31 NOTE — Progress Notes (Signed)
[facility]  NAME:  Vicki Velasquez, Vicki Velasquez NO.:  MEDICAL RECORD NO.:  [id number]      DATE OF BIRTH:  1916/04/22  PHYSICIAN:  Ardath Sax, M.D.           VISIT DATE:                                  OFFICE VISIT   This is a 79 year old lady who we have seen before for pressure ulcer on the back of her left heel.  She has a history of many, many problems including hypertension, atrial fibrillation, hypothyroidism, a left knee and a left hip replaced so that she really is very inactive.  She also has a history of heart valve replacement, a benign brain tumor removed which was a meningioma.  She has also had cataract surgery.  She was healed up from this pressure ulcer and comes back with a recurrence.  It is about 1.5 cm long and 0.5 cm wide.  She is afebrile with a pulse of 60, blood pressure 148/68.  She weighs 135 pounds.  She is somewhat alert but very feeble.  She only stands a little bit and she can walk with help on a walker to the bathroom.  Her medicines are Levoxyl, Cymbalta, aspirin, metoprolol, loratadine, hydrochlorothiazide, and she takes oxycodone and Voltaren.  I am going to treat this with Santyl.  I did debride it a little.  I am going to call it a pressure ulcer, left heel, stage III.  Other diagnosis on her is previous surgery on her aortic valve, previous total hip done on her left hip, also has hypertension, history of meningioma removed and hypertension.     Ardath Sax, M.D.     PP/MEDQ  D:  03/30/2013  T:  03/31/2013  Job:  [id number]

## 2013-04-06 ENCOUNTER — Encounter (HOSPITAL_BASED_OUTPATIENT_CLINIC_OR_DEPARTMENT_OTHER): Payer: Medicare Other | Attending: General Surgery

## 2013-04-06 DIAGNOSIS — Z7982 Long term (current) use of aspirin: Secondary | ICD-10-CM | POA: Insufficient documentation

## 2013-04-06 DIAGNOSIS — I10 Essential (primary) hypertension: Secondary | ICD-10-CM | POA: Insufficient documentation

## 2013-04-06 DIAGNOSIS — Z954 Presence of other heart-valve replacement: Secondary | ICD-10-CM | POA: Insufficient documentation

## 2013-04-06 DIAGNOSIS — Z96659 Presence of unspecified artificial knee joint: Secondary | ICD-10-CM | POA: Insufficient documentation

## 2013-04-06 DIAGNOSIS — E039 Hypothyroidism, unspecified: Secondary | ICD-10-CM | POA: Insufficient documentation

## 2013-04-06 DIAGNOSIS — L8993 Pressure ulcer of unspecified site, stage 3: Secondary | ICD-10-CM | POA: Insufficient documentation

## 2013-04-06 DIAGNOSIS — I4891 Unspecified atrial fibrillation: Secondary | ICD-10-CM | POA: Insufficient documentation

## 2013-04-06 DIAGNOSIS — Z96649 Presence of unspecified artificial hip joint: Secondary | ICD-10-CM | POA: Insufficient documentation

## 2013-04-06 DIAGNOSIS — L89609 Pressure ulcer of unspecified heel, unspecified stage: Secondary | ICD-10-CM | POA: Insufficient documentation

## 2013-04-06 DIAGNOSIS — Z86011 Personal history of benign neoplasm of the brain: Secondary | ICD-10-CM | POA: Insufficient documentation

## 2013-04-06 DIAGNOSIS — Z79899 Other long term (current) drug therapy: Secondary | ICD-10-CM | POA: Insufficient documentation

## 2013-04-30 ENCOUNTER — Other Ambulatory Visit: Payer: Self-pay | Admitting: Internal Medicine

## 2013-04-30 MED ORDER — OMEPRAZOLE 20 MG PO CPDR: DELAYED_RELEASE_CAPSULE | ORAL | Status: DC

## 2013-05-04 ENCOUNTER — Encounter (HOSPITAL_BASED_OUTPATIENT_CLINIC_OR_DEPARTMENT_OTHER): Payer: Medicare Other | Attending: General Surgery

## 2013-05-04 DIAGNOSIS — L8993 Pressure ulcer of unspecified site, stage 3: Secondary | ICD-10-CM | POA: Insufficient documentation

## 2013-05-04 DIAGNOSIS — L89609 Pressure ulcer of unspecified heel, unspecified stage: Secondary | ICD-10-CM | POA: Insufficient documentation

## 2013-05-07 ENCOUNTER — Other Ambulatory Visit: Payer: Self-pay | Admitting: *Deleted

## 2013-05-07 DIAGNOSIS — Z79899 Other long term (current) drug therapy: Secondary | ICD-10-CM

## 2013-05-07 DIAGNOSIS — D649 Anemia, unspecified: Secondary | ICD-10-CM

## 2013-06-01 ENCOUNTER — Encounter (HOSPITAL_BASED_OUTPATIENT_CLINIC_OR_DEPARTMENT_OTHER): Payer: Medicare Other | Attending: General Surgery

## 2013-06-01 DIAGNOSIS — L89609 Pressure ulcer of unspecified heel, unspecified stage: Secondary | ICD-10-CM | POA: Insufficient documentation

## 2013-06-01 DIAGNOSIS — L8993 Pressure ulcer of unspecified site, stage 3: Secondary | ICD-10-CM | POA: Insufficient documentation

## 2013-06-18 ENCOUNTER — Other Ambulatory Visit: Payer: Self-pay | Admitting: Internal Medicine

## 2013-06-19 ENCOUNTER — Other Ambulatory Visit: Payer: Self-pay | Admitting: Internal Medicine

## 2013-06-26 ENCOUNTER — Encounter: Payer: Self-pay | Admitting: *Deleted

## 2013-07-02 ENCOUNTER — Other Ambulatory Visit: Payer: Medicare Other

## 2013-07-02 DIAGNOSIS — D649 Anemia, unspecified: Secondary | ICD-10-CM

## 2013-07-02 DIAGNOSIS — Z79899 Other long term (current) drug therapy: Secondary | ICD-10-CM

## 2013-07-03 LAB — CBC WITH DIFFERENTIAL/PLATELET
Basophils Absolute: 0.1 10*3/uL (ref 0.0–0.2)
Eos: 5 % (ref 0–5)
Hemoglobin: 11.4 g/dL (ref 11.1–15.9)
Immature Granulocytes: 0 % (ref 0–2)
Lymphs: 26 % (ref 14–46)
MCHC: 33.1 g/dL (ref 31.5–35.7)
Monocytes: 9 % (ref 4–12)
Neutrophils Absolute: 4.5 10*3/uL (ref 1.4–7.0)
RDW: 14 % (ref 12.3–15.4)
WBC: 7.5 10*3/uL (ref 3.4–10.8)

## 2013-07-03 LAB — COMPREHENSIVE METABOLIC PANEL
Albumin: 4 g/dL (ref 3.2–4.6)
BUN: 17 mg/dL (ref 10–36)
CO2: 26 mmol/L (ref 18–29)
Calcium: 10 mg/dL (ref 8.6–10.2)
Chloride: 101 mmol/L (ref 97–108)
Creatinine, Ser: 0.91 mg/dL (ref 0.57–1.00)
Globulin, Total: 2.2 g/dL (ref 1.5–4.5)
Glucose: 95 mg/dL (ref 65–99)
Total Protein: 6.2 g/dL (ref 6.0–8.5)

## 2013-07-06 ENCOUNTER — Encounter: Payer: Self-pay | Admitting: *Deleted

## 2013-07-06 ENCOUNTER — Other Ambulatory Visit: Payer: Self-pay | Admitting: *Deleted

## 2013-07-07 ENCOUNTER — Ambulatory Visit (INDEPENDENT_AMBULATORY_CARE_PROVIDER_SITE_OTHER): Payer: Medicare Other | Admitting: Internal Medicine

## 2013-07-07 VITALS — BP 122/78 | HR 70 | Temp 97.6°F | Resp 16 | Ht 62.0 in | Wt 140.0 lb

## 2013-07-07 DIAGNOSIS — D649 Anemia, unspecified: Secondary | ICD-10-CM

## 2013-07-07 DIAGNOSIS — R269 Unspecified abnormalities of gait and mobility: Secondary | ICD-10-CM | POA: Insufficient documentation

## 2013-07-07 DIAGNOSIS — I10 Essential (primary) hypertension: Secondary | ICD-10-CM | POA: Insufficient documentation

## 2013-07-07 DIAGNOSIS — E785 Hyperlipidemia, unspecified: Secondary | ICD-10-CM

## 2013-07-07 DIAGNOSIS — R609 Edema, unspecified: Secondary | ICD-10-CM | POA: Insufficient documentation

## 2013-07-07 DIAGNOSIS — E559 Vitamin D deficiency, unspecified: Secondary | ICD-10-CM

## 2013-07-07 DIAGNOSIS — G4700 Insomnia, unspecified: Secondary | ICD-10-CM

## 2013-07-07 DIAGNOSIS — I509 Heart failure, unspecified: Secondary | ICD-10-CM

## 2013-07-07 DIAGNOSIS — K219 Gastro-esophageal reflux disease without esophagitis: Secondary | ICD-10-CM

## 2013-07-07 DIAGNOSIS — I4891 Unspecified atrial fibrillation: Secondary | ICD-10-CM

## 2013-07-07 DIAGNOSIS — R0602 Shortness of breath: Secondary | ICD-10-CM

## 2013-07-07 DIAGNOSIS — I447 Left bundle-branch block, unspecified: Secondary | ICD-10-CM

## 2013-07-07 DIAGNOSIS — E039 Hypothyroidism, unspecified: Secondary | ICD-10-CM

## 2013-07-07 MED ORDER — MELATONIN 3 MG PO TABS: ORAL_TABLET | ORAL | Status: DC

## 2013-07-07 NOTE — Progress Notes (Signed)
Subjective:    Patient ID: Vicki Velasquez, female    DOB: [birth year], 79 y.o.   MRN: [id number] HPI  Hypothyroidism: Controlled on current dose levothyroxin  Hypertension: Controlled  Edema: chronic unchanged.  Atrial fibrillation: chronic. Rate controlled.  SOB (shortness of breath): persistent  Congestive heart failure, unspecified: controlled  Unspecified vitamin D deficiency: Remains on supplements  Anemia, unspecified: Improved  Abnormality of gait: No longer walking. She can barely stand with maximum assistance.  Esophageal reflux: Heartburn frequently. Burning sensation in the esophagus. Intermittent. Using Prilosec bid and Tums occ  Other and unspecified hyperlipidemia: Controlled   Other left bundle branch block: Chronic condition and EKG. Then a for further workup at this time.  Insomnia, unspecified:: Try melatonin    Current Outpatient Prescriptions on File Prior to Visit  Medication Sig Dispense Refill  . aspirin EC 81 MG tablet Take 81 mg by mouth daily.      . Cholecalciferol (VITAMIN D PO) Take by mouth.        . DULoxetine (CYMBALTA) 30 MG capsule TAKE ONE CAPSULE BY MOUTH EVERY DAY  30 capsule  5  . levothyroxine (SYNTHROID, LEVOTHROID) 75 MCG tablet Take 75 mcg by mouth daily.        Marland Kitchen losartan-hydrochlorothiazide (HYZAAR) 100-25 MG per tablet TAKE 1 TABLET EVERY DAY TO CONTROL BLOOD PRESSURE  30 tablet  3  . metoprolol succinate (TOPROL-XL) 50 MG 24 hr tablet TAKE 1 TABLET BY MOUTH EVERY DAY  30 tablet  4  . Multiple Vitamin (MULITIVITAMIN WITH MINERALS) TABS Take 1 tablet by mouth daily.      Marland Kitchen omeprazole (PRILOSEC) 20 MG capsule Take one tablet twice a day for acid reflux  60 capsule  5  . potassium chloride (K-DUR) 10 MEQ tablet Take 10 mEq by mouth 2 (two) times daily. Take one tablet once a day potassium supplement      . ranitidine (ZANTAC) 75 MG tablet Take 75 mg by mouth at bedtime.        . sennosides-docusate sodium (SENOKOT-S) 8.6-50 MG  tablet Take 1 tablet by mouth as needed for constipation.       . ferrous sulfate 325 (65 FE) MG tablet Take 1 tablet (325 mg total) by mouth daily with breakfast.           Review of Systems  Constitutional: Positive for activity change and fatigue. Negative for fever, chills, appetite change and unexpected weight change.  HENT: Positive for hearing loss and tinnitus. Negative for ear pain, congestion, neck pain and neck stiffness.   Eyes: Negative.   Respiratory: Negative.   Cardiovascular: Negative.   Gastrointestinal: Negative.   Genitourinary:       Urinary leakage.  Musculoskeletal:       Chronic, debilitating back pains which severely affects her mobility. There is generalized muscle weakness particularly below the waist. She has joint pains in the hips and knees. There is some restriction of motion at the knees and perhaps the hips as well. She is status post fracture left femur at the hip on 01/26/13. Residual pain is still present. She is very unsteady on standing.  Neurological: Positive for weakness. Negative for tremors, seizures, syncope and speech difficulty.       Difficulty with balance. Unable to walk.  Hematological: Negative.   Psychiatric/Behavioral: Positive for sleep disturbance.       Early morning awakening.       Objective:BP 122/78  Pulse 70  Temp(Src) 97.6 F (36.4 C) (Oral)  Resp 16  Ht 5\' 2"  (1.575 m)  Wt 140 lb (63.504 kg)  BMI 25.6 kg/m2  SpO2 99%    Physical Exam  Constitutional: She is oriented to person, place, and time. She appears distressed.  Frail. Elderly. Thin body habitus.  HENT:  Head: Normocephalic and atraumatic.  Right Ear: External ear normal.  Left Ear: External ear normal.  Nose: Nose normal.  Mouth/Throat: Oropharynx is clear and moist.  Significant loss of hearing bilaterally. Wears hearing aids bilaterally.  Eyes: EOM are normal. Pupils are equal, round, and reactive to light.  Wears corrective lenses.  Neck: Neck  supple. No JVD present. No tracheal deviation present. No thyromegaly present.  Cardiovascular: Normal rate, regular rhythm, normal heart sounds and intact distal pulses.  Exam reveals no gallop and no friction rub.   No murmur heard. Pulmonary/Chest: No respiratory distress. She has no wheezes. She has rales. She exhibits no tenderness.  Dry rales bilaterally throughout the lung fields.  Abdominal: She exhibits no distension and no mass. There is no tenderness.  Musculoskeletal: She exhibits edema and tenderness.  Restricted movement due to poor balance and continued pain in the right hip and back. Lives in a wheelchair. She can stand with significant assistance from others. She no longer walks.  Lymphadenopathy:    She has no cervical adenopathy.  Neurological: She is alert and oriented to person, place, and time. No cranial nerve deficit. Coordination normal.  Skin: No rash noted. No erythema. No pallor.  Pressure ulcer of the heel has healed, but there is residual discomfort.  Psychiatric: She has a normal mood and affect. Her behavior is normal. Judgment and thought content normal.  Mildly depressed affect.     Appointment on 07/02/2013  Component Date Value Range Status  . WBC 07/02/2013 7.5  3.4 - 10.8 x10E3/uL Final  . RBC 07/02/2013 3.90  3.77 - 5.28 x10E6/uL Final  . Hemoglobin 07/02/2013 11.4  11.1 - 15.9 g/dL Final  . HCT 47/82/9562 34.4  34.0 - 46.6 % Final  . MCV 07/02/2013 88  79 - 97 fL Final  . MCH 07/02/2013 29.2  26.6 - 33.0 pg Final  . MCHC 07/02/2013 33.1  31.5 - 35.7 g/dL Final  . RDW 13/07/6577 14.0  12.3 - 15.4 % Final  . Neutrophils Relative % 07/02/2013 59  40 - 74 % Final  . Lymphs 07/02/2013 26  14 - 46 % Final  . Monocytes 07/02/2013 9  4 - 12 % Final  . Eos 07/02/2013 5  0 - 5 % Final  . Basos 07/02/2013 1  0 - 3 % Final  . Neutrophils Absolute 07/02/2013 4.5  1.4 - 7.0 x10E3/uL Final  . Lymphocytes Absolute 07/02/2013 2.0  0.7 - 3.1 x10E3/uL Final  .  Monocytes Absolute 07/02/2013 0.6  0.1 - 0.9 x10E3/uL Final  . Eosinophils Absolute 07/02/2013 0.4  0.0 - 0.4 x10E3/uL Final  . Basophils Absolute 07/02/2013 0.1  0.0 - 0.2 x10E3/uL Final  . Immature Granulocytes 07/02/2013 0  0 - 2 % Final  . Immature Grans (Abs) 07/02/2013 0.0  0.0 - 0.1 x10E3/uL Final  . Glucose 07/02/2013 95  65 - 99 mg/dL Final  . BUN 46/96/2952 17  10 - 36 mg/dL Final  . Creatinine, Ser 07/02/2013 0.91  0.57 - 1.00 mg/dL Final  . GFR calc non Af Amer 07/02/2013 54* >59 mL/min/1.73 Final  . GFR calc Af Amer 07/02/2013 62  >59 mL/min/1.73 Final  . BUN/Creatinine Ratio 07/02/2013 19  11 - 26 Final  . Sodium 07/02/2013 140  134 - 144 mmol/L Final  . Potassium 07/02/2013 4.6  3.5 - 5.2 mmol/L Final  . Chloride 07/02/2013 101  97 - 108 mmol/L Final  . CO2 07/02/2013 26  18 - 29 mmol/L Final  . Calcium 07/02/2013 10.0  8.6 - 10.2 mg/dL Final  . Total Protein 07/02/2013 6.2  6.0 - 8.5 g/dL Final  . Albumin 16/10/9603 4.0  3.2 - 4.6 g/dL Final  . Globulin, Total 07/02/2013 2.2  1.5 - 4.5 g/dL Final  . Albumin/Globulin Ratio 07/02/2013 1.8  1.1 - 2.5 Final  . Total Bilirubin 07/02/2013 0.5  0.0 - 1.2 mg/dL Final  . Alkaline Phosphatase 07/02/2013 130* 39 - 117 IU/L Final  . AST 07/02/2013 14  0 - 40 IU/L Final  . ALT 07/02/2013 9  0 - 32 IU/L Final   07/07/13 EKG; rate 68. AF. LBBB.     Assessment & Plan:  Hypothyroidism stable on current dose of levothyroxine  - Plan: TSH  Hypertension controlled   - Plan: Comprehensive metabolic panel  Edema: Mild edema  Atrial fibrillation: Controlled paroxysmal atrial fibrillation   - Plan: EKG 12-Lead, EKG 12-Lead  SOB (shortness of breath): Unchanged  Congestive heart failure, unspecified: chronic problem it seems to be under control  Unspecified vitamin D deficiency: Remains on supplements  Anemia, unspecified: Resolved with hemoglobin 11.4   - Plan: CBC With differential/Platelet  Abnormality of gait: No longer  walks. This is a combination of factors including pain in the right hip where she had a fracture and significant pain in her back. Weakness of the muscles was developed over the last several months. Balance is quite poor.  Esophageal reflux: Episodes continue, but she is not particularly distressed.  Other and unspecified hyperlipidemia: Controlled   - Plan: Lipid panel  Other left bundle branch block: Chronic condition on electrocardiogram no further workup felt necessary at this time.  Insomnia, unspecified - Plan: Melatonin 3 MG TABS

## 2013-07-07 NOTE — Patient Instructions (Addendum)
Continue current medication. Try using 1-2 drops of olive oil in each ear canal weekly to keep wax soft.

## 2013-07-09 ENCOUNTER — Other Ambulatory Visit: Payer: Self-pay | Admitting: Geriatric Medicine

## 2013-07-09 MED ORDER — POTASSIUM CHLORIDE CRYS ER 10 MEQ PO TBCR: 10.0000 meq | EXTENDED_RELEASE_TABLET | Freq: Every day | ORAL | Status: DC

## 2013-07-13 ENCOUNTER — Encounter (HOSPITAL_BASED_OUTPATIENT_CLINIC_OR_DEPARTMENT_OTHER): Payer: Medicare Other | Attending: General Surgery

## 2013-07-13 DIAGNOSIS — L89609 Pressure ulcer of unspecified heel, unspecified stage: Secondary | ICD-10-CM | POA: Insufficient documentation

## 2013-07-13 DIAGNOSIS — L8993 Pressure ulcer of unspecified site, stage 3: Secondary | ICD-10-CM | POA: Insufficient documentation

## 2013-08-03 ENCOUNTER — Encounter (HOSPITAL_BASED_OUTPATIENT_CLINIC_OR_DEPARTMENT_OTHER): Payer: Medicare Other | Attending: General Surgery

## 2013-08-03 DIAGNOSIS — L8993 Pressure ulcer of unspecified site, stage 3: Secondary | ICD-10-CM | POA: Insufficient documentation

## 2013-08-03 DIAGNOSIS — L89609 Pressure ulcer of unspecified heel, unspecified stage: Secondary | ICD-10-CM | POA: Insufficient documentation

## 2013-09-14 ENCOUNTER — Encounter (HOSPITAL_BASED_OUTPATIENT_CLINIC_OR_DEPARTMENT_OTHER): Payer: Medicare Other | Attending: General Surgery

## 2013-09-14 DIAGNOSIS — L8992 Pressure ulcer of unspecified site, stage 2: Secondary | ICD-10-CM | POA: Insufficient documentation

## 2013-09-14 DIAGNOSIS — L89609 Pressure ulcer of unspecified heel, unspecified stage: Secondary | ICD-10-CM | POA: Insufficient documentation

## 2013-09-16 ENCOUNTER — Encounter: Payer: Self-pay | Admitting: Nurse Practitioner

## 2013-09-16 ENCOUNTER — Ambulatory Visit (INDEPENDENT_AMBULATORY_CARE_PROVIDER_SITE_OTHER): Payer: Medicare Other | Admitting: Nurse Practitioner

## 2013-09-16 VITALS — BP 132/70 | HR 63 | Temp 97.4°F | Resp 16 | Ht 62.0 in | Wt 139.8 lb

## 2013-09-16 DIAGNOSIS — E039 Hypothyroidism, unspecified: Secondary | ICD-10-CM

## 2013-09-16 DIAGNOSIS — D649 Anemia, unspecified: Secondary | ICD-10-CM

## 2013-09-16 DIAGNOSIS — I10 Essential (primary) hypertension: Secondary | ICD-10-CM

## 2013-09-16 DIAGNOSIS — E785 Hyperlipidemia, unspecified: Secondary | ICD-10-CM

## 2013-09-16 DIAGNOSIS — R5381 Other malaise: Secondary | ICD-10-CM

## 2013-09-16 NOTE — Progress Notes (Signed)
Patient ID: Vicki Velasquez, female   DOB: 14-Jun-1916, 79 y.o.   MRN: [id number]   Allergies  Allergen Reactions  . Accupril [Quinapril Hcl]   . Clarithromycin   . Codeine   . Demerol   . Fentanyl   . Lexapro [Escitalopram Oxalate]   . Monistat [Miconazole Nitrate-Wipes]   . Prednisone   . Tagamet [Cimetidine]     Chief Complaint  Patient presents with  . Acute Visit    head fuzzy(not clear) left -right, ? TIA, past: tumor removed from left side.    HPI: Patient is a 79 y.o.  female seen in the office today due to "her head is bothering her" Started this morning. Denies pain. Reports its all mixed up inside and fuzzy up in here- as she points to her forehead. Denies dizziness. Denies pressure. Worse when she moves; feels like something is moving inside.  Niece with pt during visit reports she occasionally will have these funny feeling in her head and then they will go away- wonders if she should be taking oxycodone 5 mg so frequently and if this could have something to do with these feelings  Review of Systems:  Review of Systems  Constitutional: Negative for fever and chills.  HENT: Negative for hearing loss, ear pain and tinnitus.   Eyes: Positive for double vision (always sees double vision). Negative for blurred vision and pain.  Respiratory: Negative for shortness of breath.   Cardiovascular: Negative for chest pain and palpitations.  Gastrointestinal: Negative for heartburn, abdominal pain, diarrhea and constipation.  Genitourinary: Negative for dysuria, urgency and frequency.  Skin: Negative.   Neurological: Negative for dizziness, tremors, sensory change, speech change, focal weakness, loss of consciousness and headaches.  Psychiatric/Behavioral: The patient is not nervous/anxious and does not have insomnia.      Past Medical History  Diagnosis Date  . Atrial flutter   . Hypothyroidism   . Hypertension   . Edema     Leg  . Atrial fibrillation     not on  coumadin-managed on meciaitons.  . SOB (shortness of breath) 12/16/10    At rest-no oxygen at rest  . Chronic lung disease     Stable  . Cardiomegaly     Stable  . Disc disease, degenerative, lumbar or lumbosacral     Dr. Dyane [facility]-Last Steroids 5 yr ago  . Mixed hearing loss, bilateral   . Ingrowing nail 03/04/2013  . Congestive heart failure, unspecified   . Impacted cerumen 05/13/2012  . Other malaise and fatigue   . Pressure ulcer, heel(707.07)   . Hypopotassemia   . Unspecified constipation   . Hypoxemia   . Other abnormal blood chemistry   . Open fracture of unspecified part of neck of femur   . Other chronic pain   . Atherosclerosis of native arteries of the extremities, unspecified   . Scoliosis (and kyphoscoliosis), idiopathic   . Nausea alone   . Urinary frequency   . Proteinuria   . Senile osteoporosis   . Unspecified vitamin D deficiency   . Herpes zoster with other nervous system complications(053.19)   . Depressive disorder, not elsewhere classified   . Cervicalgia   . Dysphagia, unspecified(787.20)   . Sciatica   . Pain in limb   . Anemia, unspecified   . Lumbago   . Allergic rhinitis due to pollen   . Abnormality of gait   . Neoplasm of uncertain behavior of skin   . Other atopic dermatitis and related  conditions   . Esophageal reflux   . Mononeuritis of unspecified site   . Diaphragmatic hernia without mention of obstruction or gangrene   . Pain in joint, pelvic region and thigh   . Other and unspecified hyperlipidemia   . Encounter for long-term (current) use of other medications   . Osteoarthrosis, unspecified whether generalized or localized, lower leg   . Other left bundle branch block   . Other heart block   . Dizziness and giddiness   . Other symptoms involving cardiovascular system   . Undiagnosed cardiac murmurs   . Insomnia, unspecified   . Neuralgia, postherpetic    Past Surgical History  Procedure Laterality Date  . Aortic  valve replacement      2002 replaced with a pig vavle.    . Tumour      had surgery ion the 70's for it   . Hip arthroplasty  01/28/2012    Procedure: ARTHROPLASTY BIPOLAR HIP;  Surgeon: Javier Docker, MD;  Location: [facility];  Service: Orthopedics;  Laterality: Left;  . Partial hysterectomy  1961  . Appendectomy  1961  . Dilation and curettage of uterus  1959  . Supracervical hysterectomy for endometriosis  1960  . Resection right parietal meningioma  08/19/1990  . Cardiac catherization  10/1999    dr tysinger  . Aortic valve replacement with a porcine valve      DR HENDRICKSON  . Arthroscopy left knee      DR Montez Morita   . Cataract surgery right eye      DR EPPS  . Orif of fracture (l) femur     Social History:   reports that she has never smoked. She does not have any smokeless tobacco history on file. She reports that she does not drink alcohol. Her drug history is not on file.  Family History  Problem Relation Age of Onset  . Stroke Mother   . Cancer Mother     LUNG  . Stroke Father   . Cancer Sister     BREAST,LUNG,LIVER  . Cancer Sister     LYMPHOMA    Medications: Patient's Medications  New Prescriptions   No medications on file  Previous Medications   ASCORBIC ACID (VITAMIN C) 1000 MG TABLET    Take 1,000 mg by mouth daily.   ASPIRIN EC 81 MG TABLET    Take 81 mg by mouth daily.   CHOLECALCIFEROL (VITAMIN D PO)    Take by mouth.     DULOXETINE (CYMBALTA) 30 MG CAPSULE    TAKE ONE CAPSULE BY MOUTH EVERY DAY   FERROUS SULFATE 325 (65 FE) MG TABLET    Take 1 tablet (325 mg total) by mouth daily with breakfast.   IBUPROFEN (ADVIL) 200 MG TABLET    Take 200 mg by mouth every 6 (six) hours as needed for pain.   LEVOTHYROXINE (SYNTHROID, LEVOTHROID) 75 MCG TABLET    Take 75 mcg by mouth daily.     LOSARTAN-HYDROCHLOROTHIAZIDE (HYZAAR) 100-25 MG PER TABLET    TAKE 1 TABLET EVERY DAY TO CONTROL BLOOD PRESSURE   MELATONIN 3 MG TABS    One nightly to help sleep    METOPROLOL SUCCINATE (TOPROL-XL) 50 MG 24 HR TABLET    TAKE 1 TABLET BY MOUTH EVERY DAY   MULTIPLE VITAMIN (MULITIVITAMIN WITH MINERALS) TABS    Take 1 tablet by mouth daily.   OMEPRAZOLE (PRILOSEC) 20 MG CAPSULE    Take one tablet twice a day for acid  reflux   OXYCODONE (OXY IR/ROXICODONE) 5 MG IMMEDIATE RELEASE TABLET    Take 5 mg by mouth every 6 (six) hours.   POTASSIUM CHLORIDE (K-DUR) 10 MEQ TABLET    Take 10 mEq by mouth 2 (two) times daily. Take one tablet once a day potassium supplement   POTASSIUM CHLORIDE (KLOR-CON M10) 10 MEQ TABLET    Take 1 tablet (10 mEq total) by mouth daily.   RANITIDINE (ZANTAC) 75 MG TABLET    Take 75 mg by mouth at bedtime.     SENNOSIDES-DOCUSATE SODIUM (SENOKOT-S) 8.6-50 MG TABLET    Take 1 tablet by mouth as needed for constipation.   Modified Medications   No medications on file  Discontinued Medications   No medications on file     Physical Exam:  Filed Vitals:   09/16/13 1520  BP: 132/70  Pulse: 63  Temp: 97.4 F (36.3 C)  TempSrc: Oral  Resp: 16  Height: 5\' 2"  (1.575 m)  Weight: 139 lb 12.8 oz (63.413 kg)  SpO2: 99%    Physical Exam  Constitutional: She is well-developed, well-nourished, and in no distress. No distress.  HENT:  Head: Normocephalic and atraumatic.  Right Ear: External ear normal.  Left Ear: External ear normal.  Nose: Nose normal.  Mouth/Throat: Oropharynx is clear and moist. No oropharyngeal exudate.  Eyes: Conjunctivae and EOM are normal. Pupils are equal, round, and reactive to light.  Neck: Normal range of motion. Neck supple. No thyromegaly present.  Cardiovascular: Normal rate, regular rhythm and normal heart sounds.   Pulmonary/Chest: Effort normal and breath sounds normal. No respiratory distress. She has no wheezes.  Abdominal: Soft. Bowel sounds are normal.  Lymphadenopathy:    She has no cervical adenopathy.  Neurological: She is alert.  Wheelchair bound; generalized weakness; grips equal  bilaterally   Skin: Skin is warm and dry. She is not diaphoretic.  Psychiatric: Affect normal.    Labs reviewed: Basic Metabolic Panel:  Recent Labs  16/10/96 0928  NA 140  K 4.6  CL 101  CO2 26  GLUCOSE 95  BUN 17  CREATININE 0.91  CALCIUM 10.0   Liver Function Tests:  Recent Labs  07/02/13 0928  AST 14  ALT 9  ALKPHOS 130*  BILITOT 0.5  PROT 6.2   No results found for this basename: LIPASE, AMYLASE,  in the last 8760 hours No results found for this basename: AMMONIA,  in the last 8760 hours CBC:  Recent Labs  07/02/13 0928  WBC 7.5  NEUTROABS 4.5  HGB 11.4  HCT 34.4  MCV 88    Assessment/Plan 1. Malaise "'fuzzy feeling" in head starting today; exam WNL for age and co-morbities; could be related to pain medication; caregiver and niece can try to spread doses out to q 8 hours to see if that helps if pt complaints of increased pain may not be able to tolerate but due to advanced age narcotics should be used minimally; will also get blood work to make sure pt does not have other causes or infection - Urinalysis - Urine culture  2. Anemia, unspecified Will follow up labs - CBC With differential/Platelet  3. Hypertension Stable today - Comprehensive metabolic panel  4. Other and unspecified hyperlipidemia - Lipid panel- scheduled for fasting labs  5. Hypothyroidism Pt on synthroid and has not had thyroid checked recently will follow up TSH   To follow up if symptoms fail to improve or get worse; given warning signs to go to ED; pt and  caregivers understand

## 2013-09-17 LAB — URINALYSIS
Ketones, UA: NEGATIVE
Nitrite, UA: NEGATIVE
Specific Gravity, UA: 1.015 (ref 1.005–1.030)
pH, UA: 6 (ref 5.0–7.5)

## 2013-09-17 LAB — CBC WITH DIFFERENTIAL
Eos: 4 %
Immature Grans (Abs): 0 10*3/uL (ref 0.0–0.1)
Immature Granulocytes: 0 %
Monocytes: 10 %
Neutrophils Relative %: 55 %
Platelets: 225 10*3/uL (ref 150–379)
RBC: 3.89 x10E6/uL (ref 3.77–5.28)
WBC: 8.5 10*3/uL (ref 3.4–10.8)

## 2013-09-17 LAB — COMPREHENSIVE METABOLIC PANEL
AST: 17 IU/L (ref 0–40)
Albumin: 4.3 g/dL (ref 3.2–4.6)
BUN: 23 mg/dL (ref 10–36)
CO2: 26 mmol/L (ref 18–29)
Calcium: 9.9 mg/dL (ref 8.6–10.2)
Creatinine, Ser: 0.91 mg/dL (ref 0.57–1.00)
Globulin, Total: 2.1 g/dL (ref 1.5–4.5)
Sodium: 142 mmol/L (ref 134–144)

## 2013-09-18 LAB — URINE CULTURE

## 2013-10-05 ENCOUNTER — Encounter (HOSPITAL_BASED_OUTPATIENT_CLINIC_OR_DEPARTMENT_OTHER): Payer: Medicare Other | Attending: General Surgery

## 2013-10-05 DIAGNOSIS — L89609 Pressure ulcer of unspecified heel, unspecified stage: Secondary | ICD-10-CM | POA: Insufficient documentation

## 2013-10-05 DIAGNOSIS — L8993 Pressure ulcer of unspecified site, stage 3: Secondary | ICD-10-CM | POA: Insufficient documentation

## 2013-10-14 ENCOUNTER — Other Ambulatory Visit: Payer: Self-pay | Admitting: Internal Medicine

## 2013-10-28 ENCOUNTER — Other Ambulatory Visit: Payer: Self-pay | Admitting: Nurse Practitioner

## 2013-10-29 ENCOUNTER — Other Ambulatory Visit: Payer: Self-pay | Admitting: Internal Medicine

## 2013-11-09 ENCOUNTER — Other Ambulatory Visit: Payer: Medicare Other

## 2013-11-09 ENCOUNTER — Other Ambulatory Visit: Payer: Self-pay | Admitting: *Deleted

## 2013-11-09 DIAGNOSIS — I509 Heart failure, unspecified: Secondary | ICD-10-CM

## 2013-11-10 LAB — COMPREHENSIVE METABOLIC PANEL
ALT: 13 IU/L (ref 0–32)
Albumin/Globulin Ratio: 2.2 (ref 1.1–2.5)
Albumin: 4.1 g/dL (ref 3.2–4.6)
Alkaline Phosphatase: 131 IU/L — ABNORMAL HIGH (ref 39–117)
BUN/Creatinine Ratio: 20 (ref 11–26)
BUN: 17 mg/dL (ref 10–36)
CO2: 22 mmol/L (ref 18–29)
Chloride: 99 mmol/L (ref 97–108)
Creatinine, Ser: 0.87 mg/dL (ref 0.57–1.00)
GFR calc Af Amer: 66 mL/min/{1.73_m2} (ref >59)
GFR calc non Af Amer: 57 mL/min/{1.73_m2} — ABNORMAL LOW (ref >59)
Potassium: 4.3 mmol/L (ref 3.5–5.2)
Sodium: 140 mmol/L (ref 134–144)
Total Bilirubin: 0.5 mg/dL (ref 0.0–1.2)

## 2013-11-10 LAB — CBC WITH DIFFERENTIAL/PLATELET
Basos: 1 %
Eos: 4 %
Eosinophils Absolute: 0.3 10*3/uL (ref 0.0–0.4)
HCT: 34.2 % (ref 34.0–46.6)
Hemoglobin: 11.3 g/dL (ref 11.1–15.9)
Immature Grans (Abs): 0 10*3/uL (ref 0.0–0.1)
Lymphocytes Absolute: 1.9 10*3/uL (ref 0.7–3.1)
Lymphs: 26 %
MCH: 29.7 pg (ref 26.6–33.0)
MCV: 90 fL (ref 79–97)
Neutrophils Absolute: 4.5 10*3/uL (ref 1.4–7.0)
Neutrophils Relative %: 62 %
RBC: 3.8 x10E6/uL (ref 3.77–5.28)
WBC: 7.2 10*3/uL (ref 3.4–10.8)

## 2013-11-10 LAB — LIPID PANEL
Cholesterol, Total: 178 mg/dL (ref 100–199)
LDL Calculated: 100 mg/dL — ABNORMAL HIGH (ref 0–99)

## 2013-11-11 ENCOUNTER — Encounter: Payer: Self-pay | Admitting: Internal Medicine

## 2013-11-11 ENCOUNTER — Ambulatory Visit (INDEPENDENT_AMBULATORY_CARE_PROVIDER_SITE_OTHER): Payer: Medicare Other | Admitting: Internal Medicine

## 2013-11-11 VITALS — BP 132/76 | HR 72 | Temp 97.9°F | Wt 142.0 lb

## 2013-11-11 DIAGNOSIS — I10 Essential (primary) hypertension: Secondary | ICD-10-CM

## 2013-11-11 DIAGNOSIS — M25529 Pain in unspecified elbow: Secondary | ICD-10-CM

## 2013-11-11 DIAGNOSIS — G4700 Insomnia, unspecified: Secondary | ICD-10-CM

## 2013-11-11 DIAGNOSIS — R609 Edema, unspecified: Secondary | ICD-10-CM

## 2013-11-11 DIAGNOSIS — D649 Anemia, unspecified: Secondary | ICD-10-CM

## 2013-11-11 DIAGNOSIS — I4891 Unspecified atrial fibrillation: Secondary | ICD-10-CM

## 2013-11-11 DIAGNOSIS — L97421 Non-pressure chronic ulcer of left heel and midfoot limited to breakdown of skin: Secondary | ICD-10-CM

## 2013-11-11 DIAGNOSIS — M25521 Pain in right elbow: Secondary | ICD-10-CM

## 2013-11-11 DIAGNOSIS — E785 Hyperlipidemia, unspecified: Secondary | ICD-10-CM

## 2013-11-11 DIAGNOSIS — L97409 Non-pressure chronic ulcer of unspecified heel and midfoot with unspecified severity: Secondary | ICD-10-CM

## 2013-11-11 DIAGNOSIS — E039 Hypothyroidism, unspecified: Secondary | ICD-10-CM

## 2013-11-11 DIAGNOSIS — R269 Unspecified abnormalities of gait and mobility: Secondary | ICD-10-CM

## 2013-11-11 MED ORDER — TROLAMINE SALICYLATE 10 % EX CREA: 1.0000 "application " | TOPICAL_CREAM | CUTANEOUS | Status: DC | PRN

## 2013-11-11 NOTE — Progress Notes (Signed)
Subjective:    Patient ID: Vicki Velasquez, female    DOB: 04/27/1916, 79 y.o.   MRN: [id number]  Chief Complaint  Patient presents with  . Medical Managment of Chronic Issues    4 month f/u, discuss labs(copy printed)  . Immunizations    Td to be printed  . Arm Pain    Rt arm pain when pressed on x 2 months    HPI Right elbow pain: present several  Weeks. No known injury. Mostly painful at right radial head. Denies pain with lifting , turning, or use of arm or elbow  Abnormality of gait: unstable on standing. Can use walker at home. Uses wheelchair for longer distances  Anemia, unspecified: resolved  Atrial fibrillation:chronic. Rate controlled  Edema : chronic 1+ bipedal  Hypertension : controlled  Other and unspecified hyperlipidemia : controlled  Insomnia, unspecified: improved  Hypothyroidism :compensated  Ulcer of heel, left, limited to breakdown of skin: [facility]. Continues to have some pain at left heel and a crease that is tender at the site of her prior ulcer    Current Outpatient Prescriptions on File Prior to Visit  Medication Sig Dispense Refill  . Ascorbic Acid (VITAMIN C) 1000 MG tablet Take 1,000 mg by mouth daily.      Marland Kitchen aspirin EC 81 MG tablet Take 81 mg by mouth daily.      . Cholecalciferol (VITAMIN D PO) Take by mouth.        . DULoxetine (CYMBALTA) 30 MG capsule TAKE ONE CAPSULE BY MOUTH EVERY DAY  30 capsule  5  . ibuprofen (ADVIL) 200 MG tablet Take 200 mg by mouth every 6 (six) hours as needed for pain.      Marland Kitchen levothyroxine (SYNTHROID, LEVOTHROID) 75 MCG tablet Take 75 mcg by mouth daily.        Marland Kitchen losartan-hydrochlorothiazide (HYZAAR) 100-25 MG per tablet TAKE 1 TABLET EVERY DAY TO CONTROL BLOOD PRESSURE  30 tablet  0  . Melatonin 3 MG TABS One nightly to help sleep  30 tablet  0  . metoprolol succinate (TOPROL-XL) 50 MG 24 hr tablet TAKE 1 TABLET BY MOUTH EVERY DAY  30 tablet  4  . Multiple Vitamin (MULITIVITAMIN WITH  MINERALS) TABS Take 1 tablet by mouth daily.      Marland Kitchen omeprazole (PRILOSEC) 20 MG capsule Take one tablet twice a day for acid reflux  60 capsule  5  . oxyCODONE (OXY IR/ROXICODONE) 5 MG immediate release tablet Take 5 mg by mouth every 6 (six) hours.      . potassium chloride (K-DUR,KLOR-CON) 10 MEQ tablet 1 by mouth daily   30 tablet  3  . ranitidine (ZANTAC) 75 MG tablet Take 75 mg by mouth at bedtime.        . sennosides-docusate sodium (SENOKOT-S) 8.6-50 MG tablet Take 1 tablet by mouth as needed for constipation.       . ferrous sulfate 325 (65 FE) MG tablet Take 1 tablet (325 mg total) by mouth daily with breakfast.       No current facility-administered medications on file prior to visit.    Review of Systems  Constitutional: Positive for activity change and fatigue. Negative for fever, chills, appetite change and unexpected weight change.  HENT: Positive for hearing loss and tinnitus. Negative for congestion and ear pain.   Eyes: Negative.   Respiratory: Negative.   Cardiovascular: Negative.   Gastrointestinal: Negative.   Genitourinary:       Urinary  leakage.  Musculoskeletal: Negative for neck pain and neck stiffness.       Chronic, debilitating back pains which severely affects her mobility. There is generalized muscle weakness particularly below the waist. She has joint pains in the hips and knees. There is some restriction of motion at the knees and perhaps the hips as well. She is status post fracture left femur at the hip on 01/26/13. Residual pain is still present. She is very unsteady on standing.  Skin:       Scarring from prior left heel ucer  Neurological: Positive for weakness. Negative for tremors, seizures, syncope and speech difficulty.       Difficulty with balance. Unable to walk.  Hematological: Negative.   Psychiatric/Behavioral: Positive for sleep disturbance.       Early morning awakening.       Objective:BP 132/76  Pulse 72  Temp(Src) 97.9 F (36.6 C)  (Oral)  Wt 142 lb (64.411 kg)  SpO2 99%    Physical Exam  Constitutional: She is oriented to person, place, and time. She appears distressed.  Frail. Elderly. Thin body habitus.  HENT:  Head: Normocephalic and atraumatic.  Right Ear: External ear normal.  Left Ear: External ear normal.  Nose: Nose normal.  Mouth/Throat: Oropharynx is clear and moist.  Significant loss of hearing bilaterally. Wears hearing aids bilaterally.  Eyes: EOM are normal. Pupils are equal, round, and reactive to light.  Wears corrective lenses.  Neck: Neck supple. No JVD present. No tracheal deviation present. No thyromegaly present.  Cardiovascular: Normal rate, regular rhythm, normal heart sounds and intact distal pulses.  Exam reveals no gallop and no friction rub.   No murmur heard. Pulmonary/Chest: No respiratory distress. She has no wheezes. She has rales. She exhibits no tenderness.  Dry rales bilaterally throughout the lung fields.  Abdominal: She exhibits no distension and no mass. There is no tenderness.  Musculoskeletal: She exhibits edema and tenderness.  Restricted movement due to poor balance and continued pain in the right hip and back. Lives in a wheelchair. She can stand with significant assistance from others. She no longer walks.  Lymphadenopathy:    She has no cervical adenopathy.  Neurological: She is alert and oriented to person, place, and time. No cranial nerve deficit. Coordination normal.  Skin: No rash noted. No erythema. No pallor.  Pressure ulcer of the heel has healed, but there is residual discomfort.  Psychiatric: She has a normal mood and affect. Her behavior is normal. Judgment and thought content normal.  Mildly depressed affect.    Appointment on 11/09/2013  Component Date Value Range Status  . Glucose 11/09/2013 104* 65 - 99 mg/dL Final  . BUN 16/10/9603 17  10 - 36 mg/dL Final  . Creatinine, Ser 11/09/2013 0.87  0.57 - 1.00 mg/dL Final  . GFR calc non Af Amer  11/09/2013 57* >59 mL/min/1.73 Final  . GFR calc Af Amer 11/09/2013 66  >59 mL/min/1.73 Final  . BUN/Creatinine Ratio 11/09/2013 20  11 - 26 Final  . Sodium 11/09/2013 140  134 - 144 mmol/L Final  . Potassium 11/09/2013 4.3  3.5 - 5.2 mmol/L Final  . Chloride 11/09/2013 99  97 - 108 mmol/L Final  . CO2 11/09/2013 22  18 - 29 mmol/L Final  . Calcium 11/09/2013 10.2  8.6 - 10.2 mg/dL Final  . Total Protein 11/09/2013 6.0  6.0 - 8.5 g/dL Final  . Albumin 54/08/8118 4.1  3.2 - 4.6 g/dL Final  . Globulin, Total 11/09/2013  1.9  1.5 - 4.5 g/dL Final  . Albumin/Globulin Ratio 11/09/2013 2.2  1.1 - 2.5 Final  . Total Bilirubin 11/09/2013 0.5  0.0 - 1.2 mg/dL Final  . Alkaline Phosphatase 11/09/2013 131* 39 - 117 IU/L Final  . AST 11/09/2013 21  0 - 40 IU/L Final  . ALT 11/09/2013 13  0 - 32 IU/L Final  . Cholesterol, Total 11/09/2013 178  100 - 199 mg/dL Final  . Triglycerides 11/09/2013 121  0 - 149 mg/dL Final  . HDL 16/10/9603 54  >39 mg/dL Final   Comment: According to ATP-III Guidelines, HDL-C >59 mg/dL is considered a                          negative risk factor for CHD.  Marland Kitchen VLDL Cholesterol Cal 11/09/2013 24  5 - 40 mg/dL Final  . LDL Calculated 11/09/2013 540* 0 - 99 mg/dL Final  . Chol/HDL Ratio 11/09/2013 3.3  0.0 - 4.4 ratio units Final   Comment:                                   T. Chol/HDL Ratio                                                                      Men  Women                                                        1/2 Avg.Risk  3.4    3.3                                                            Avg.Risk  5.0    4.4                                                         2X Avg.Risk  9.6    7.1                                                         3X Avg.Risk 23.4   11.0  . WBC 11/09/2013 7.2  3.4 - 10.8 x10E3/uL Final  . RBC 11/09/2013 3.80  3.77 - 5.28 x10E6/uL Final  . Hemoglobin 11/09/2013 11.3  11.1 - 15.9 g/dL Final  . HCT 98/10/9146 34.2  34.0 - 46.6 %  Final  . MCV 11/09/2013 90  79 - 97 fL Final  .  MCH 11/09/2013 29.7  26.6 - 33.0 pg Final  . MCHC 11/09/2013 33.0  31.5 - 35.7 g/dL Final  . RDW 11/91/4782 14.2  12.3 - 15.4 % Final  . Neutrophils Relative % 11/09/2013 62   Final  . Lymphs 11/09/2013 26   Final  . Monocytes 11/09/2013 7   Final  . Eos 11/09/2013 4   Final  . Basos 11/09/2013 1   Final  . Neutrophils Absolute 11/09/2013 4.5  1.4 - 7.0 x10E3/uL Final  . Lymphocytes Absolute 11/09/2013 1.9  0.7 - 3.1 x10E3/uL Final  . Monocytes Absolute 11/09/2013 0.5  0.1 - 0.9 x10E3/uL Final  . Eosinophils Absolute 11/09/2013 0.3  0.0 - 0.4 x10E3/uL Final  . Basophils Absolute 11/09/2013 0.0  0.0 - 0.2 x10E3/uL Final  . Immature Granulocytes 11/09/2013 0   Final  . Immature Grans (Abs) 11/09/2013 0.0  0.0 - 0.1 x10E3/uL Final  Office Visit on 09/16/2013  Component Date Value Range Status  . Specific Gravity, UA 09/16/2013 1.015  1.005 - 1.030 Final  . pH, UA 09/16/2013 6.0  5.0 - 7.5 Final  . Color, UA 09/16/2013 Yellow  Yellow Final  . Appearance Ur 09/16/2013 Clear  Clear Final  . Leukocytes, UA 09/16/2013 Trace* Negative Final  . Protein, UA 09/16/2013 Negative  Negative/Trace Final  . Glucose, UA 09/16/2013 Negative  Negative Final  . Ketones, UA 09/16/2013 Negative  Negative Final  . RBC, UA 09/16/2013 Negative  Negative Final  . Bilirubin, UA 09/16/2013 Negative  Negative Final  . Urobilinogen, Ur 09/16/2013 0.2  0.0 - 1.9 mg/dL Final  . Nitrite, UA 95/62/1308 Negative  Negative Final  . Urine Culture, Routine 09/16/2013 Final report   Final  . Result 1 09/16/2013 Comment   Final   Comment: Viridans streptococcus group                          10,000-25,000 colony forming units per mL                          Susceptibility not normally performed on this organism.  . WBC 09/16/2013 8.5  3.4 - 10.8 x10E3/uL Final  . RBC 09/16/2013 3.89  3.77 - 5.28 x10E6/uL Final  . Hemoglobin 09/16/2013 11.6  11.1 - 15.9 g/dL Final  . HCT  65/78/4696 35.3  34.0 - 46.6 % Final  . MCV 09/16/2013 91  79 - 97 fL Final  . MCH 09/16/2013 29.8  26.6 - 33.0 pg Final  . MCHC 09/16/2013 32.9  31.5 - 35.7 g/dL Final  . RDW 29/52/8413 14.0  12.3 - 15.4 % Final  . Platelets 09/16/2013 225  150 - 379 x10E3/uL Final  . Neutrophils Relative % 09/16/2013 55   Final  . Lymphs 09/16/2013 30   Final  . Monocytes 09/16/2013 10   Final  . Eos 09/16/2013 4   Final  . Basos 09/16/2013 1   Final  . Neutrophils Absolute 09/16/2013 4.7  1.4 - 7.0 x10E3/uL Final  . Lymphocytes Absolute 09/16/2013 2.6  0.7 - 3.1 x10E3/uL Final  . Monocytes Absolute 09/16/2013 0.8  0.1 - 0.9 x10E3/uL Final  . Eosinophils Absolute 09/16/2013 0.3  0.0 - 0.4 x10E3/uL Final  . Basophils Absolute 09/16/2013 0.1  0.0 - 0.2 x10E3/uL Final  . Immature Granulocytes 09/16/2013 0   Final  . Immature Grans (Abs) 09/16/2013 0.0  0.0 - 0.1 x10E3/uL Final  . Glucose 09/16/2013 99  65 - 99 mg/dL Final  . BUN 16/10/9603 23  10 - 36 mg/dL Final  . Creatinine, Ser 09/16/2013 0.91  0.57 - 1.00 mg/dL Final  . GFR calc non Af Amer 09/16/2013 54* >59 mL/min/1.73 Final  . GFR calc Af Amer 09/16/2013 62  >59 mL/min/1.73 Final  . BUN/Creatinine Ratio 09/16/2013 25  11 - 26 Final  . Sodium 09/16/2013 142  134 - 144 mmol/L Final  . Potassium 09/16/2013 5.1  3.5 - 5.2 mmol/L Final  . Chloride 09/16/2013 100  97 - 108 mmol/L Final  . CO2 09/16/2013 26  18 - 29 mmol/L Final  . Calcium 09/16/2013 9.9  8.6 - 10.2 mg/dL Final  . Total Protein 09/16/2013 6.4  6.0 - 8.5 g/dL Final  . Albumin 54/08/8118 4.3  3.2 - 4.6 g/dL Final  . Globulin, Total 09/16/2013 2.1  1.5 - 4.5 g/dL Final  . Albumin/Globulin Ratio 09/16/2013 2.0  1.1 - 2.5 Final  . Total Bilirubin 09/16/2013 0.3  0.0 - 1.2 mg/dL Final  . Alkaline Phosphatase 09/16/2013 139* 39 - 117 IU/L Final  . AST 09/16/2013 17  0 - 40 IU/L Final  . ALT 09/16/2013 8  0 - 32 IU/L Final  . TSH 09/16/2013 1.470  0.450 - 4.500 uIU/mL Final          Assessment & Plan:  Right elbow pain - Plan: trolamine salicylate (ASPERCREME/ALOE) 10 % cream applied qid  Abnormality of gait: unchanged  Anemia, unspecified: resolved  Atrial fibrillation: rate controlled  Edema: unchanged - Plan: Comprehensive metabolic panel  Hypertension: controlled - Plan: Comprehensive metabolic panel  Other and unspecified hyperlipidemia : controlled- Plan: Lipid panel  Insomnia, unspecified: improved  Hypothyroidism : compensated- Plan: TSH  Ulcer of heel, left, limited to breakdown of skin: chronic. Continue to pad with bandages

## 2013-11-11 NOTE — Patient Instructions (Signed)
Continue current medications. 

## 2013-11-12 ENCOUNTER — Other Ambulatory Visit: Payer: Self-pay | Admitting: *Deleted

## 2013-11-12 MED ORDER — TETANUS-DIPHTH-ACELL PERTUSSIS 5-2.5-18.5 LF-MCG/0.5 IM SUSP: 0.5000 mL | Freq: Once | INTRAMUSCULAR | Status: DC

## 2013-11-20 ENCOUNTER — Other Ambulatory Visit: Payer: Self-pay | Admitting: Internal Medicine

## 2013-11-28 ENCOUNTER — Other Ambulatory Visit: Payer: Self-pay | Admitting: Internal Medicine

## 2013-12-08 ENCOUNTER — Other Ambulatory Visit: Payer: Self-pay | Admitting: Internal Medicine

## 2014-01-07 ENCOUNTER — Other Ambulatory Visit: Payer: Self-pay | Admitting: Internal Medicine

## 2014-02-04 ENCOUNTER — Other Ambulatory Visit: Payer: Self-pay | Admitting: Internal Medicine

## 2014-02-05 ENCOUNTER — Other Ambulatory Visit: Payer: Self-pay | Admitting: Internal Medicine

## 2014-03-03 ENCOUNTER — Other Ambulatory Visit: Payer: Self-pay | Admitting: Internal Medicine

## 2014-03-04 ENCOUNTER — Other Ambulatory Visit: Payer: Self-pay | Admitting: Internal Medicine

## 2014-03-08 ENCOUNTER — Other Ambulatory Visit: Payer: Medicare Other

## 2014-03-08 DIAGNOSIS — E785 Hyperlipidemia, unspecified: Secondary | ICD-10-CM

## 2014-03-08 DIAGNOSIS — R609 Edema, unspecified: Secondary | ICD-10-CM

## 2014-03-08 DIAGNOSIS — E039 Hypothyroidism, unspecified: Secondary | ICD-10-CM

## 2014-03-08 DIAGNOSIS — I10 Essential (primary) hypertension: Secondary | ICD-10-CM

## 2014-03-09 LAB — COMPREHENSIVE METABOLIC PANEL
A/G RATIO: 2.1 (ref 1.1–2.5)
ALBUMIN: 3.9 g/dL (ref 3.2–4.6)
ALT: 13 IU/L (ref 0–32)
AST: 19 IU/L (ref 0–40)
Alkaline Phosphatase: 136 IU/L — ABNORMAL HIGH (ref 39–117)
BILIRUBIN TOTAL: 0.5 mg/dL (ref 0.0–1.2)
BUN/Creatinine Ratio: 21 (ref 11–26)
BUN: 19 mg/dL (ref 10–36)
CO2: 24 mmol/L (ref 18–29)
Calcium: 9.5 mg/dL (ref 8.7–10.3)
Chloride: 98 mmol/L (ref 97–108)
Creatinine, Ser: 0.89 mg/dL (ref 0.57–1.00)
GFR, EST AFRICAN AMERICAN: 64 mL/min/{1.73_m2} (ref >59)
GFR, EST NON AFRICAN AMERICAN: 55 mL/min/{1.73_m2} — AB (ref >59)
GLUCOSE: 97 mg/dL (ref 65–99)
Globulin, Total: 1.9 g/dL (ref 1.5–4.5)
Potassium: 4.1 mmol/L (ref 3.5–5.2)
Sodium: 139 mmol/L (ref 134–144)
TOTAL PROTEIN: 5.8 g/dL — AB (ref 6.0–8.5)

## 2014-03-09 LAB — LIPID PANEL
CHOL/HDL RATIO: 3.1 ratio (ref 0.0–4.4)
Cholesterol, Total: 146 mg/dL (ref 100–199)
HDL: 47 mg/dL (ref >39)
LDL Calculated: 80 mg/dL (ref 0–99)
TRIGLYCERIDES: 94 mg/dL (ref 0–149)
VLDL Cholesterol Cal: 19 mg/dL (ref 5–40)

## 2014-03-09 LAB — TSH: TSH: 1.82 u[IU]/mL (ref 0.450–4.500)

## 2014-03-10 ENCOUNTER — Ambulatory Visit (INDEPENDENT_AMBULATORY_CARE_PROVIDER_SITE_OTHER): Payer: Medicare Other | Admitting: Internal Medicine

## 2014-03-10 ENCOUNTER — Encounter: Payer: Self-pay | Admitting: Internal Medicine

## 2014-03-10 VITALS — BP 136/80 | HR 60 | Temp 96.9°F | Resp 14 | Wt 145.8 lb

## 2014-03-10 DIAGNOSIS — R0602 Shortness of breath: Secondary | ICD-10-CM

## 2014-03-10 DIAGNOSIS — I4891 Unspecified atrial fibrillation: Secondary | ICD-10-CM

## 2014-03-10 DIAGNOSIS — L97409 Non-pressure chronic ulcer of unspecified heel and midfoot with unspecified severity: Secondary | ICD-10-CM

## 2014-03-10 DIAGNOSIS — Z952 Presence of prosthetic heart valve: Secondary | ICD-10-CM

## 2014-03-10 DIAGNOSIS — R21 Rash and other nonspecific skin eruption: Secondary | ICD-10-CM

## 2014-03-10 DIAGNOSIS — I509 Heart failure, unspecified: Secondary | ICD-10-CM

## 2014-03-10 DIAGNOSIS — E039 Hypothyroidism, unspecified: Secondary | ICD-10-CM

## 2014-03-10 DIAGNOSIS — I10 Essential (primary) hypertension: Secondary | ICD-10-CM

## 2014-03-10 DIAGNOSIS — D649 Anemia, unspecified: Secondary | ICD-10-CM

## 2014-03-10 DIAGNOSIS — Z954 Presence of other heart-valve replacement: Secondary | ICD-10-CM

## 2014-03-10 DIAGNOSIS — R609 Edema, unspecified: Secondary | ICD-10-CM

## 2014-03-10 DIAGNOSIS — R269 Unspecified abnormalities of gait and mobility: Secondary | ICD-10-CM

## 2014-03-10 NOTE — Progress Notes (Signed)
Patient ID: Vicki Velasquez, female   DOB: 08/01/1916, 80 y.o.   MRN: [id number]    Location:    [facility]  Place of Service:  OFFICE   Allergies  Allergen Reactions  . Accupril [Quinapril Hcl]   . Clarithromycin   . Codeine   . Demerol   . Fentanyl   . Lexapro [Escitalopram Oxalate]   . Monistat [Miconazole Nitrate-Wipes]   . Prednisone   . Tagamet [Cimetidine]     Chief Complaint  Patient presents with  . Medical Managment of Chronic Issues    4 month f/u & discuss labs. (printed) SOB when using walker to go to the bathroom  . other    rash on Rt side bottom on/off x 1 year that itches.  using hydrocortizone cream. also has an itchy spot on her stomach x 2-3 days    HPI:   Atrial fibrillation: rate controlled  Hypertension -controlled  Hypothyroidism - compensated  Congestive heart failure, unspecified: compensated. Still has dyspnea.  Rash and nonspecific skin eruption: scaly area on the right buttock. Blush area on the RLQ of the abd.  S/P aortic valve replacement: no chest pain.  SOB (shortness of breath): chronic complaint. Unchanged.  Ulcer of heel: healed, but there is residual scarring that is painful . using Icy-Hot cream for relief  Edema: unchanged. Mild bipedal.  Abnormality of gait: able to stand with help. Unable to walk any significant distance. Unstable on standing.  Anemia, unspecified - normal in Nov 2014, but needs followup     Medications: Patient's Medications  New Prescriptions   No medications on file  Previous Medications   ASCORBIC ACID (VITAMIN C) 1000 MG TABLET    Take 1,000 mg by mouth daily.   ASPIRIN EC 81 MG TABLET    Take 81 mg by mouth daily.   DULOXETINE (CYMBALTA) 30 MG CAPSULE    TAKE ONE CAPSULE BY MOUTH EVERY DAY   FERROUS SULFATE 325 (65 FE) MG TABLET    Take 1 tablet (325 mg total) by mouth daily with breakfast.   IBUPROFEN (ADVIL) 200 MG TABLET    Take 200 mg by mouth every 6 (six) hours as needed for pain.   KLOR-CON 10 10 MEQ TABLET    1 BY MOUTH DAILY   LEVOTHYROXINE (SYNTHROID, LEVOTHROID) 75 MCG TABLET    TAKE 1 TABLET EVERY DAY FOR THYROID   LOSARTAN-HYDROCHLOROTHIAZIDE (HYZAAR) 100-25 MG PER TABLET    TAKE 1 TABLET EVERY DAY TO CONTROL BLOOD PRESSURE   METOPROLOL SUCCINATE (TOPROL-XL) 50 MG 24 HR TABLET    TAKE 1 TABLET BY MOUTH EVERY DAY   MULTIPLE VITAMIN (MULITIVITAMIN WITH MINERALS) TABS    Take 1 tablet by mouth daily.   OMEPRAZOLE (PRILOSEC) 20 MG CAPSULE    TAKE ONE CAPSULE BY MOUTH TWICE A DAY   OXYCODONE (OXY IR/ROXICODONE) 5 MG IMMEDIATE RELEASE TABLET    Take 5 mg by mouth every 6 (six) hours.   SENNOSIDES-DOCUSATE SODIUM (SENOKOT-S) 8.6-50 MG TABLET    Take 1 tablet by mouth as needed for constipation.    TDAP (BOOSTRIX) 5-2.5-18.5 LF-MCG/0.5 INJECTION    Inject 0.5 mLs into the muscle once.   TROLAMINE SALICYLATE (ASPERCREME/ALOE) 10 % CREAM    Apply 1 application topically as needed for muscle pain. Apply to painful elbow 4 times daily  Modified Medications   No medications on file  Discontinued Medications   CHOLECALCIFEROL (VITAMIN D PO)    Take by mouth.     KLOR-CON  10 10 MEQ TABLET    1 BY MOUTH DAILY   MELATONIN 3 MG TABS    One nightly to help sleep   RANITIDINE (ZANTAC) 75 MG TABLET    Take 75 mg by mouth at bedtime.       Review of Systems  Constitutional: Positive for activity change and fatigue. Negative for fever, chills, appetite change and unexpected weight change.  HENT: Positive for hearing loss and tinnitus. Negative for congestion and ear pain.   Eyes: Negative.   Respiratory: Negative.   Cardiovascular: Negative.   Gastrointestinal: Negative.   Genitourinary:       Urinary leakage.  Musculoskeletal: Negative for neck pain and neck stiffness.       Chronic, debilitating back pains which severely affects her mobility. There is generalized muscle weakness particularly below the waist. She has joint pains in the hips and knees. There is some restriction of  motion at the knees and perhaps the hips as well. She is status post fracture left femur at the hip on 01/26/13. Residual pain is still present. She is very unsteady on standing.  Skin: Positive for rash.       Scarring from prior left heel ucer  Neurological: Positive for weakness. Negative for tremors, seizures, syncope and speech difficulty.       Difficulty with balance. Unable to walk.  Hematological: Negative.   Psychiatric/Behavioral: Positive for sleep disturbance.       Early morning awakening.    Filed Vitals:   03/10/14 1114  BP: 136/80  Pulse: 60  Temp: 96.9 F (36.1 C)  TempSrc: Oral  Resp: 14  Weight: 145 lb 12.8 oz (66.134 kg)  SpO2: 97%   Physical Exam  Constitutional: She is oriented to person, place, and time. She appears distressed.  Frail. Elderly. Thin body habitus.  HENT:  Head: Normocephalic and atraumatic.  Right Ear: External ear normal.  Left Ear: External ear normal.  Nose: Nose normal.  Mouth/Throat: Oropharynx is clear and moist.  Significant loss of hearing bilaterally. Wears hearing aids bilaterally.  Eyes: EOM are normal. Pupils are equal, round, and reactive to light.  Wears corrective lenses.  Neck: Neck supple. No JVD present. No tracheal deviation present. No thyromegaly present.  Cardiovascular: Normal rate, regular rhythm, normal heart sounds and intact distal pulses.  Exam reveals no gallop and no friction rub.   No murmur heard. Pulmonary/Chest: No respiratory distress. She has no wheezes. She has rales. She exhibits no tenderness.  Dry rales bilaterally throughout the lung fields.  Abdominal: She exhibits no distension and no mass. There is no tenderness.  Musculoskeletal: She exhibits edema and tenderness.  Restricted movement due to poor balance and continued pain in the right hip and back. Lives in a wheelchair. She can stand with significant assistance from others. She no longer walks.  Lymphadenopathy:    She has no cervical  adenopathy.  Neurological: She is alert and oriented to person, place, and time. No cranial nerve deficit. Coordination normal.  Skin: No rash noted. No erythema. No pallor.  Pressure ulcer of the heel has healed, but there is residual discomfort. Blush area in the RLQ of abd. Scaly area on the right buttock.  Psychiatric: She has a normal mood and affect. Her behavior is normal. Judgment and thought content normal.  Mildly depressed affect.     Labs reviewed: Appointment on 03/08/2014  Component Date Value Ref Range Status  . Cholesterol, Total 03/08/2014 146  100 - 199 mg/dL Final  .  Triglycerides 03/08/2014 94  0 - 149 mg/dL Final  . HDL 03/08/2014 47  >39 mg/dL Final   Comment: According to ATP-III Guidelines, HDL-C >59 mg/dL is considered a                          negative risk factor for CHD.  Marland Kitchen VLDL Cholesterol Cal 03/08/2014 19  5 - 40 mg/dL Final  . LDL Calculated 03/08/2014 80  0 - 99 mg/dL Final  . Chol/HDL Ratio 03/08/2014 3.1  0.0 - 4.4 ratio units Final   Comment:                                   T. Chol/HDL Ratio                                                                      Men  Women                                                        1/2 Avg.Risk  3.4    3.3                                                            Avg.Risk  5.0    4.4                                                         2X Avg.Risk  9.6    7.1                                                         3X Avg.Risk 23.4   11.0  . Glucose 03/08/2014 97  65 - 99 mg/dL Final  . BUN 03/08/2014 19  10 - 36 mg/dL Final  . Creatinine, Ser 03/08/2014 0.89  0.57 - 1.00 mg/dL Final  . GFR calc non Af Amer 03/08/2014 55* >59 mL/min/1.73 Final  . GFR calc Af Amer 03/08/2014 64  >59 mL/min/1.73 Final  . BUN/Creatinine Ratio 03/08/2014 21  11 - 26 Final  . Sodium 03/08/2014 139  134 - 144 mmol/L Final  . Potassium 03/08/2014 4.1  3.5 - 5.2 mmol/L Final  . Chloride 03/08/2014 98  97 - 108 mmol/L Final   . CO2 03/08/2014 24  18 - 29 mmol/L Final  . Calcium 03/08/2014 9.5  8.7 - 10.3 mg/dL Final  . Total Protein 03/08/2014 5.8* 6.0 - 8.5  g/dL Final  . Albumin 03/08/2014 3.9  3.2 - 4.6 g/dL Final  . Globulin, Total 03/08/2014 1.9  1.5 - 4.5 g/dL Final  . Albumin/Globulin Ratio 03/08/2014 2.1  1.1 - 2.5 Final  . Total Bilirubin 03/08/2014 0.5  0.0 - 1.2 mg/dL Final  . Alkaline Phosphatase 03/08/2014 136* 39 - 117 IU/L Final  . AST 03/08/2014 19  0 - 40 IU/L Final  . ALT 03/08/2014 13  0 - 32 IU/L Final  . TSH 03/08/2014 1.820  0.450 - 4.500 uIU/mL Final      Assessment/Plan  1. Atrial fibrillation Stable. Rate controlled.  2. Hypertension controlled - CMP; Future  3. Hypothyroidism compensated - TSH; Future  4. Congestive heart failure, unspecified compensated  5. Rash and nonspecific skin eruption Use hydrocortisone 1% cream  6. S/P aortic valve replacement stable  7. SOB (shortness of breath) chronic and likely related to cardiac issues  8. Ulcer of heel continue OTC creams for pain relief  9. Edema unchanged  10. Abnormality of gait unchanged  11. Anemia, unspecified - CBC With differential/Platelet; Future

## 2014-03-10 NOTE — Patient Instructions (Signed)
Continue medications as listed 

## 2014-03-17 ENCOUNTER — Other Ambulatory Visit: Payer: Self-pay | Admitting: Internal Medicine

## 2014-04-27 ENCOUNTER — Encounter: Payer: Self-pay | Admitting: Internal Medicine

## 2014-04-28 ENCOUNTER — Other Ambulatory Visit: Payer: Self-pay | Admitting: Internal Medicine

## 2014-05-03 ENCOUNTER — Other Ambulatory Visit: Payer: Self-pay | Admitting: Internal Medicine

## 2014-05-26 ENCOUNTER — Other Ambulatory Visit: Payer: Self-pay | Admitting: Internal Medicine

## 2014-06-11 ENCOUNTER — Encounter (HOSPITAL_BASED_OUTPATIENT_CLINIC_OR_DEPARTMENT_OTHER): Payer: Self-pay | Admitting: Emergency Medicine

## 2014-06-11 ENCOUNTER — Emergency Department (HOSPITAL_BASED_OUTPATIENT_CLINIC_OR_DEPARTMENT_OTHER): Payer: Medicare Other

## 2014-06-11 ENCOUNTER — Emergency Department (HOSPITAL_BASED_OUTPATIENT_CLINIC_OR_DEPARTMENT_OTHER)
Admission: EM | Admit: 2014-06-11 | Discharge: 2014-06-11 | Disposition: A | Discharge status: Home / Self Care | Payer: Medicare Other | Attending: Emergency Medicine | Admitting: Emergency Medicine

## 2014-06-11 DIAGNOSIS — Z8619 Personal history of other infectious and parasitic diseases: Secondary | ICD-10-CM | POA: Insufficient documentation

## 2014-06-11 DIAGNOSIS — F329 Major depressive disorder, single episode, unspecified: Secondary | ICD-10-CM | POA: Insufficient documentation

## 2014-06-11 DIAGNOSIS — Z8669 Personal history of other diseases of the nervous system and sense organs: Secondary | ICD-10-CM | POA: Insufficient documentation

## 2014-06-11 DIAGNOSIS — E785 Hyperlipidemia, unspecified: Secondary | ICD-10-CM | POA: Insufficient documentation

## 2014-06-11 DIAGNOSIS — F3289 Other specified depressive episodes: Secondary | ICD-10-CM | POA: Insufficient documentation

## 2014-06-11 DIAGNOSIS — Z791 Long term (current) use of non-steroidal anti-inflammatories (NSAID): Secondary | ICD-10-CM | POA: Insufficient documentation

## 2014-06-11 DIAGNOSIS — D649 Anemia, unspecified: Secondary | ICD-10-CM | POA: Insufficient documentation

## 2014-06-11 DIAGNOSIS — K5900 Constipation, unspecified: Secondary | ICD-10-CM | POA: Insufficient documentation

## 2014-06-11 DIAGNOSIS — I10 Essential (primary) hypertension: Secondary | ICD-10-CM | POA: Insufficient documentation

## 2014-06-11 DIAGNOSIS — Z87828 Personal history of other (healed) physical injury and trauma: Secondary | ICD-10-CM | POA: Insufficient documentation

## 2014-06-11 DIAGNOSIS — K219 Gastro-esophageal reflux disease without esophagitis: Secondary | ICD-10-CM | POA: Insufficient documentation

## 2014-06-11 DIAGNOSIS — Z954 Presence of other heart-valve replacement: Secondary | ICD-10-CM | POA: Insufficient documentation

## 2014-06-11 DIAGNOSIS — L02619 Cutaneous abscess of unspecified foot: Secondary | ICD-10-CM | POA: Insufficient documentation

## 2014-06-11 DIAGNOSIS — Z7982 Long term (current) use of aspirin: Secondary | ICD-10-CM | POA: Insufficient documentation

## 2014-06-11 DIAGNOSIS — I509 Heart failure, unspecified: Secondary | ICD-10-CM | POA: Insufficient documentation

## 2014-06-11 DIAGNOSIS — G8929 Other chronic pain: Secondary | ICD-10-CM | POA: Insufficient documentation

## 2014-06-11 DIAGNOSIS — Z79899 Other long term (current) drug therapy: Secondary | ICD-10-CM | POA: Insufficient documentation

## 2014-06-11 DIAGNOSIS — I4891 Unspecified atrial fibrillation: Secondary | ICD-10-CM | POA: Insufficient documentation

## 2014-06-11 DIAGNOSIS — Z9889 Other specified postprocedural states: Secondary | ICD-10-CM | POA: Insufficient documentation

## 2014-06-11 DIAGNOSIS — M5430 Sciatica, unspecified side: Secondary | ICD-10-CM | POA: Insufficient documentation

## 2014-06-11 DIAGNOSIS — M5137 Other intervertebral disc degeneration, lumbosacral region: Secondary | ICD-10-CM | POA: Insufficient documentation

## 2014-06-11 DIAGNOSIS — E039 Hypothyroidism, unspecified: Secondary | ICD-10-CM | POA: Insufficient documentation

## 2014-06-11 DIAGNOSIS — M51379 Other intervertebral disc degeneration, lumbosacral region without mention of lumbar back pain or lower extremity pain: Secondary | ICD-10-CM | POA: Insufficient documentation

## 2014-06-11 DIAGNOSIS — L03116 Cellulitis of left lower limb: Secondary | ICD-10-CM

## 2014-06-11 DIAGNOSIS — L03119 Cellulitis of unspecified part of limb: Principal | ICD-10-CM

## 2014-06-11 DIAGNOSIS — Z872 Personal history of diseases of the skin and subcutaneous tissue: Secondary | ICD-10-CM | POA: Insufficient documentation

## 2014-06-11 LAB — COMPREHENSIVE METABOLIC PANEL
ALT: 16 U/L (ref 0–35)
AST: 21 U/L (ref 0–37)
Albumin: 3.7 g/dL (ref 3.5–5.2)
Alkaline Phosphatase: 133 U/L — ABNORMAL HIGH (ref 39–117)
BUN: 18 mg/dL (ref 6–23)
CALCIUM: 9.9 mg/dL (ref 8.4–10.5)
CO2: 23 meq/L (ref 19–32)
Chloride: 99 mEq/L (ref 96–112)
Creatinine, Ser: 0.8 mg/dL (ref 0.50–1.10)
GFR calc Af Amer: 70 mL/min — ABNORMAL LOW (ref >90)
GFR calc non Af Amer: 61 mL/min — ABNORMAL LOW (ref >90)
Glucose, Bld: 183 mg/dL — ABNORMAL HIGH (ref 70–99)
Potassium: 4 mEq/L (ref 3.7–5.3)
SODIUM: 136 meq/L — AB (ref 137–147)
Total Bilirubin: 0.6 mg/dL (ref 0.3–1.2)
Total Protein: 7.1 g/dL (ref 6.0–8.3)

## 2014-06-11 LAB — CBC WITH DIFFERENTIAL/PLATELET
BASOS ABS: 0 10*3/uL (ref 0.0–0.1)
BASOS PCT: 0 % (ref 0–1)
EOS PCT: 2 % (ref 0–5)
Eosinophils Absolute: 0.2 10*3/uL (ref 0.0–0.7)
HEMATOCRIT: 33.8 % — AB (ref 36.0–46.0)
Hemoglobin: 11.1 g/dL — ABNORMAL LOW (ref 12.0–15.0)
LYMPHS PCT: 14 % (ref 12–46)
Lymphs Abs: 1.4 10*3/uL (ref 0.7–4.0)
MCH: 30.5 pg (ref 26.0–34.0)
MCHC: 32.8 g/dL (ref 30.0–36.0)
MCV: 92.9 fL (ref 78.0–100.0)
Monocytes Absolute: 0.6 10*3/uL (ref 0.1–1.0)
Monocytes Relative: 6 % (ref 3–12)
Neutro Abs: 7.8 10*3/uL — ABNORMAL HIGH (ref 1.7–7.7)
Neutrophils Relative %: 78 % — ABNORMAL HIGH (ref 43–77)
PLATELETS: 194 10*3/uL (ref 150–400)
RBC: 3.64 MIL/uL — ABNORMAL LOW (ref 3.87–5.11)
RDW: 13.8 % (ref 11.5–15.5)
WBC: 10 10*3/uL (ref 4.0–10.5)

## 2014-06-11 MED ORDER — CLINDAMYCIN HCL 300 MG PO CAPS: 300.0000 mg | ORAL_CAPSULE | Freq: Three times a day (TID) | ORAL | Status: DC

## 2014-06-11 MED ORDER — CLINDAMYCIN PHOSPHATE 600 MG/50ML IV SOLN
600.0000 mg | Freq: Once | INTRAVENOUS | Status: AC
  Administered 2014-06-11: 600 mg via INTRAVENOUS
  Filled 2014-06-11: qty 50

## 2014-06-11 MED ORDER — SODIUM CHLORIDE 0.9 % IV BOLUS (SEPSIS)
500.0000 mL | Freq: Once | INTRAVENOUS | Status: AC
  Administered 2014-06-11: 500 mL via INTRAVENOUS

## 2014-06-11 NOTE — ED Notes (Addendum)
Redness and swelling to left foot.  Was evaluation 06/09/2014 and dx with cellulitis/took PO Keflex (did not complete treatment due to diarrhea that started this am)

## 2014-06-11 NOTE — ED Provider Notes (Signed)
CSN: [id number]     Arrival date & time 06/11/14  1146 History   First MD Initiated Contact with Patient 06/11/14 1202     Chief Complaint  Patient presents with  . Foot Pain     (Consider location/radiation/quality/duration/timing/severity/associated sxs/prior Treatment) HPI Comments: Patient is a 80 year old female with history of atrial fibrillation, CHF. She presents with complaints of redness, swelling, and pain of the left foot for the past several days. She was seen by another physician and was started on Keflex. This currently if her diarrhea and she stopped taking it. She was sent here by her physician for further evaluation and rule out of osteomyelitis. Patient denies fevers or chills. She does admit to nonbloody diarrhea. She has had no vomiting and denies fever.  Patient is a 80 y.o. female presenting with lower extremity pain. The history is provided by the patient.  Foot Pain This is a new problem. The current episode started 2 days ago. The problem occurs constantly. The problem has been gradually worsening. Pertinent negatives include no chest pain, no abdominal pain and no shortness of breath. Nothing aggravates the symptoms. Nothing relieves the symptoms. She has tried nothing for the symptoms. The treatment provided no relief.    Past Medical History  Diagnosis Date  . Atrial flutter   . Hypothyroidism   . Hypertension   . Edema     Leg  . Atrial fibrillation     not on coumadin-managed on meciaitons.  . SOB (shortness of breath) 12/16/10    At rest-no oxygen at rest  . Chronic lung disease     Stable  . Cardiomegaly     Stable  . Disc disease, degenerative, lumbar or lumbosacral     Dr. Marcha D[facility]-Last Steroids 5 yr ago  . Mixed hearing loss, bilateral   . Ingrowing nail 03/04/2013  . Congestive heart failure, unspecified   . Impacted cerumen 05/13/2012  . Other malaise and fatigue   . Pressure ulcer, heel(707.07)   . Hypopotassemia   .  Unspecified constipation   . Hypoxemia   . Other abnormal blood chemistry   . Open fracture of unspecified part of neck of femur   . Other chronic pain   . Atherosclerosis of native arteries of the extremities, unspecified   . Scoliosis (and kyphoscoliosis), idiopathic   . Nausea alone   . Urinary frequency   . Proteinuria   . Senile osteoporosis   . Unspecified vitamin D deficiency   . Herpes zoster with other nervous system complications(053.19)   . Depressive disorder, not elsewhere classified   . Cervicalgia   . Dysphagia, unspecified(787.20)   . Sciatica   . Pain in limb   . Anemia, unspecified   . Lumbago   . Allergic rhinitis due to pollen   . Abnormality of gait   . Neoplasm of uncertain behavior of skin   . Other atopic dermatitis and related conditions   . Esophageal reflux   . Mononeuritis of unspecified site   . Diaphragmatic hernia without mention of obstruction or gangrene   . Pain in joint, pelvic region and thigh   . Other and unspecified hyperlipidemia   . Encounter for long-term (current) use of other medications   . Osteoarthrosis, unspecified whether generalized or localized, lower leg   . Other left bundle branch block   . Other heart block   . Dizziness and giddiness   . Other symptoms involving cardiovascular system   . Undiagnosed cardiac murmurs   .  Insomnia, unspecified   . Neuralgia, postherpetic    Past Surgical History  Procedure Laterality Date  . Aortic valve replacement      2002 replaced with a pig vavle.    . Tumour      had surgery ion the 70's for it   . Hip arthroplasty  01/28/2012    Procedure: ARTHROPLASTY BIPOLAR HIP;  Surgeon: Johnn Hai, MD;  Location: [facility];  Service: Orthopedics;  Laterality: Left;  . Partial hysterectomy  1961  . Appendectomy  1961  . Dilation and curettage of uterus  1959  . Supracervical hysterectomy for endometriosis  1960  . Resection right parietal meningioma  08/19/1990  . Cardiac  catherization  10/1999    dr tysinger  . Aortic valve replacement with a porcine valve      DR HENDRICKSON  . Arthroscopy left knee      DR Eulas Post   . Cataract surgery right eye      DR EPPS  . Orif of fracture (l) femur    . Colonoscopy  04/05/2004    Diverticulosis, Dr.Medoff  . Esophagogastroduodenoscopy  10/03/2008  . Myelogram  10/01/2008    Cervical    Family History  Problem Relation Age of Onset  . Stroke Mother   . Cancer Mother     LUNG  . Stroke Father   . Cancer Sister     BREAST,LUNG,LIVER  . Cancer Sister     LYMPHOMA   History  Substance Use Topics  . Smoking status: Never Smoker   . Smokeless tobacco: Not on file  . Alcohol Use: No   OB History   Grav Para Term Preterm Abortions TAB SAB Ect Mult Living                 Review of Systems  Respiratory: Negative for shortness of breath.   Cardiovascular: Negative for chest pain.  Gastrointestinal: Negative for abdominal pain.  All other systems reviewed and are negative.     Allergies  Accupril; Clarithromycin; Codeine; Demerol; Fentanyl; Lexapro; Monistat; Prednisone; and Tagamet  Home Medications   Prior to Admission medications   Medication Sig Start Date End Date Taking? Authorizing Provider  Ascorbic Acid (VITAMIN C) 1000 MG tablet Take 1,000 mg by mouth daily.    Historical Provider, MD  aspirin EC 81 MG tablet Take 81 mg by mouth daily.    Historical Provider, MD  DULoxetine (CYMBALTA) 30 MG capsule TAKE ONE CAPSULE BY MOUTH EVERY DAY    Tiffany L Reed, DO  ferrous sulfate 325 (65 FE) MG tablet Take 1 tablet (325 mg total) by mouth daily with breakfast. 01/30/12 01/29/13  Charlynne Cousins, MD  ibuprofen (ADVIL) 200 MG tablet Take 200 mg by mouth every 6 (six) hours as needed for pain.    Historical Provider, MD  KLOR-CON 10 10 MEQ tablet 1 BY MOUTH DAILY 03/04/14   Estill Dooms, MD  levothyroxine (SYNTHROID, LEVOTHROID) 75 MCG tablet TAKE 1 TABLET BY MOUTH EVERY DAY FOR THYROID 05/03/14    Estill Dooms, MD  losartan-hydrochlorothiazide (HYZAAR) 100-25 MG per tablet TAKE 1 TABLET BY MOUTH ONCE DAILY TO CONTROL BLOOD PRESSURE 03/17/14   Estill Dooms, MD  metoprolol succinate (TOPROL-XL) 50 MG 24 hr tablet TAKE 1 TABLET BY MOUTH EVERY DAY 01/07/14   Estill Dooms, MD  Multiple Vitamin (MULITIVITAMIN WITH MINERALS) TABS Take 1 tablet by mouth daily.    Historical Provider, MD  omeprazole (PRILOSEC) 20 MG capsule TAKE  ONE CAPSULE BY MOUTH TWICE A DAY 05/26/14   Estill Dooms, MD  oxyCODONE (OXY IR/ROXICODONE) 5 MG immediate release tablet Take 5 mg by mouth every 6 (six) hours. 06/29/13   Historical Provider, MD  sennosides-docusate sodium (SENOKOT-S) 8.6-50 MG tablet Take 1 tablet by mouth as needed for constipation.     Historical Provider, MD  TDaP Durwin Reges) 5-2.5-18.5 LF-MCG/0.5 injection Inject 0.5 mLs into the muscle once. 11/12/13   Estill Dooms, MD  trolamine salicylate (ASPERCREME/ALOE) 10 % cream Apply 1 application topically as needed for muscle pain. Apply to painful elbow 4 times daily 11/11/13   Estill Dooms, MD   BP 172/88  Pulse 87  Temp(Src) 98.2 F (36.8 C) (Oral)  Resp 20  Ht 5\' 3"  (1.6 m)  Wt 128 lb (58.06 kg)  BMI 22.68 kg/m2  SpO2 100% Physical Exam  Nursing note and vitals reviewed. Constitutional: She is oriented to person, place, and time. She appears well-developed and well-nourished. No distress.  HENT:  Head: Normocephalic and atraumatic.  Neck: Normal range of motion. Neck supple.  Cardiovascular: Normal rate and regular rhythm.  Exam reveals no gallop and no friction rub.   No murmur heard. Pulmonary/Chest: Effort normal and breath sounds normal. No respiratory distress. She has no wheezes.  Abdominal: Soft. Bowel sounds are normal. She exhibits no distension. There is no tenderness.  Musculoskeletal: Normal range of motion. She exhibits edema.   There is 2+ pitting edema of the bilateral lower extremities.  The dorsum of the left foot is.  Edematous and warm to the touch. The heel is also erythematous and warm to the touch. There is no ulcer noted and it does not appears though the skin has been compromised.  Neurological: She is alert and oriented to person, place, and time.  Skin: Skin is warm and dry. She is not diaphoretic.    ED Course  Procedures (including critical care time) Labs Review Labs Reviewed  CBC WITH DIFFERENTIAL  COMPREHENSIVE METABOLIC PANEL    Imaging Review No results found.   EKG Interpretation None      MDM   Final diagnoses:  None    Patient is a 80 year old female who presents for evaluation of redness and swelling to her left foot. She was placed on Keflex, however this caused diarrhea. She was sent here for further evaluation and rule out of osteomyelitis. Workup reveals no elevation of white count and electrolytes that are essentially unremarkable. The x-ray of her foot reveals a focal lucency involving the medial head of the fifth metatarsal, however this is not where the focus of redness is located. The radiology reports states that gout can also have this appearance and I suspect this is the case. I feel as though she is appropriate for discharge. Will prescribe by mouth clindamycin and PRN followup.    Veryl Speak, MD 06/11/14 1345

## 2014-06-11 NOTE — Discharge Instructions (Signed)
Clindamycin as prescribed.  Followup with your primary Dr. in the next week, and return to the emergency department if you develop worsening redness, pain, high fever, or other new and concerning symptoms.   Cellulitis Cellulitis is an infection of the skin and the tissue beneath it. The infected area is usually red and tender. Cellulitis occurs most often in the arms and lower legs.  CAUSES  Cellulitis is caused by bacteria that enter the skin through cracks or cuts in the skin. The most common types of bacteria that cause cellulitis are Staphylococcus and Streptococcus. SYMPTOMS   Redness and warmth.  Swelling.  Tenderness or pain.  Fever. DIAGNOSIS  Your caregiver can usually determine what is wrong based on a physical exam. Blood tests may also be done. TREATMENT  Treatment usually involves taking an antibiotic medicine. HOME CARE INSTRUCTIONS   Take your antibiotics as directed. Finish them even if you start to feel better.  Keep the infected arm or leg elevated to reduce swelling.  Apply a warm cloth to the affected area up to 4 times per day to relieve pain.  Only take over-the-counter or prescription medicines for pain, discomfort, or fever as directed by your caregiver.  Keep all follow-up appointments as directed by your caregiver. SEEK MEDICAL CARE IF:   You notice red streaks coming from the infected area.  Your red area gets larger or turns dark in color.  Your bone or joint underneath the infected area becomes painful after the skin has healed.  Your infection returns in the same area or another area.  You notice a swollen bump in the infected area.  You develop new symptoms. SEEK IMMEDIATE MEDICAL CARE IF:   You have a fever.  You feel very sleepy.  You develop vomiting or diarrhea.  You have a general ill feeling (malaise) with muscle aches and pains. MAKE SURE YOU:   Understand these instructions.  Will watch your condition.  Will get help  right away if you are not doing well or get worse. Document Released: 09/26/2005 Document Revised: 06/17/2012 Document Reviewed: 03/03/2012 [facility] Patient Information 2014 [place].

## 2014-06-11 NOTE — ED Notes (Signed)
Patient transported to X-ray 

## 2014-07-05 ENCOUNTER — Other Ambulatory Visit: Payer: Medicare Other

## 2014-07-05 DIAGNOSIS — D649 Anemia, unspecified: Secondary | ICD-10-CM

## 2014-07-05 DIAGNOSIS — E039 Hypothyroidism, unspecified: Secondary | ICD-10-CM

## 2014-07-05 DIAGNOSIS — I10 Essential (primary) hypertension: Secondary | ICD-10-CM

## 2014-07-06 LAB — CBC WITH DIFFERENTIAL
BASOS: 1 %
Basophils Absolute: 0.1 10*3/uL (ref 0.0–0.2)
EOS ABS: 0.5 10*3/uL — AB (ref 0.0–0.4)
EOS: 7 %
HEMATOCRIT: 34.3 % (ref 34.0–46.6)
HEMOGLOBIN: 10.8 g/dL — AB (ref 11.1–15.9)
IMMATURE GRANS (ABS): 0 10*3/uL (ref 0.0–0.1)
Immature Granulocytes: 0 %
Lymphocytes Absolute: 1.9 10*3/uL (ref 0.7–3.1)
Lymphs: 28 %
MCH: 28.3 pg (ref 26.6–33.0)
MCHC: 31.5 g/dL (ref 31.5–35.7)
MCV: 90 fL (ref 79–97)
Monocytes Absolute: 0.6 10*3/uL (ref 0.1–0.9)
Monocytes: 9 %
NEUTROS ABS: 3.8 10*3/uL (ref 1.4–7.0)
NEUTROS PCT: 55 %
Platelets: 208 10*3/uL (ref 150–379)
RBC: 3.82 x10E6/uL (ref 3.77–5.28)
RDW: 14.7 % (ref 12.3–15.4)
WBC: 6.8 10*3/uL (ref 3.4–10.8)

## 2014-07-06 LAB — COMPREHENSIVE METABOLIC PANEL
ALT: 10 IU/L (ref 0–32)
AST: 19 IU/L (ref 0–40)
Albumin/Globulin Ratio: 1.9 (ref 1.1–2.5)
Albumin: 4.1 g/dL (ref 3.2–4.6)
Alkaline Phosphatase: 110 IU/L (ref 39–117)
BILIRUBIN TOTAL: 0.3 mg/dL (ref 0.0–1.2)
BUN/Creatinine Ratio: 23 (ref 11–26)
BUN: 22 mg/dL (ref 10–36)
CALCIUM: 9.6 mg/dL (ref 8.7–10.3)
CO2: 25 mmol/L (ref 18–29)
CREATININE: 0.95 mg/dL (ref 0.57–1.00)
Chloride: 99 mmol/L (ref 97–108)
GFR, EST AFRICAN AMERICAN: 59 mL/min/{1.73_m2} — AB (ref >59)
GFR, EST NON AFRICAN AMERICAN: 51 mL/min/{1.73_m2} — AB (ref >59)
GLOBULIN, TOTAL: 2.2 g/dL (ref 1.5–4.5)
Glucose: 87 mg/dL (ref 65–99)
POTASSIUM: 4.1 mmol/L (ref 3.5–5.2)
SODIUM: 141 mmol/L (ref 134–144)
TOTAL PROTEIN: 6.3 g/dL (ref 6.0–8.5)

## 2014-07-06 LAB — TSH: TSH: 1.71 u[IU]/mL (ref 0.450–4.500)

## 2014-07-07 ENCOUNTER — Ambulatory Visit (INDEPENDENT_AMBULATORY_CARE_PROVIDER_SITE_OTHER): Payer: Medicare Other | Admitting: Internal Medicine

## 2014-07-07 ENCOUNTER — Encounter: Payer: Self-pay | Admitting: Internal Medicine

## 2014-07-07 VITALS — BP 158/84 | HR 93 | Temp 98.1°F | Resp 14 | Ht 63.0 in | Wt 135.6 lb

## 2014-07-07 DIAGNOSIS — I10 Essential (primary) hypertension: Secondary | ICD-10-CM

## 2014-07-07 DIAGNOSIS — I509 Heart failure, unspecified: Secondary | ICD-10-CM

## 2014-07-07 DIAGNOSIS — I4891 Unspecified atrial fibrillation: Secondary | ICD-10-CM

## 2014-07-07 DIAGNOSIS — K219 Gastro-esophageal reflux disease without esophagitis: Secondary | ICD-10-CM

## 2014-07-07 DIAGNOSIS — E039 Hypothyroidism, unspecified: Secondary | ICD-10-CM

## 2014-07-07 DIAGNOSIS — R609 Edema, unspecified: Secondary | ICD-10-CM

## 2014-07-07 DIAGNOSIS — I482 Chronic atrial fibrillation, unspecified: Secondary | ICD-10-CM

## 2014-07-07 DIAGNOSIS — L97409 Non-pressure chronic ulcer of unspecified heel and midfoot with unspecified severity: Secondary | ICD-10-CM

## 2014-07-07 DIAGNOSIS — R269 Unspecified abnormalities of gait and mobility: Secondary | ICD-10-CM

## 2014-07-07 DIAGNOSIS — L97421 Non-pressure chronic ulcer of left heel and midfoot limited to breakdown of skin: Secondary | ICD-10-CM

## 2014-07-07 DIAGNOSIS — D649 Anemia, unspecified: Secondary | ICD-10-CM

## 2014-07-07 NOTE — Progress Notes (Signed)
Patient ID: Vicki Velasquez, female   DOB: [birth year], 80 y.o.   MRN: [id number]    Location:    [facility]  Place of Service:  OFFICE    Allergies  Allergen Reactions  . Accupril [Quinapril Hcl]   . Clarithromycin   . Codeine   . Demerol   . Fentanyl   . Lexapro [Escitalopram Oxalate]   . Monistat [Miconazole Nitrate-Wipes]   . Prednisone   . Tagamet [Cimetidine]     Chief Complaint  Patient presents with  . Medical Management of Chronic Issues    3 month follow up and discuss labs    HPI:  Ulcer of heel, left, limited to breakdown of skin: appears healed, but the area remains veery tender  Hypothyroidism, unspecified hypothyroidism type - compensated  Essential hypertension - controlled  Edema: mild bipedal  Congestive heart failure, unspecified; compensated  Anemia, unspecified - improved  Abnormality of gait: riding wheelchair. Unstable on standing. Cannot walk any significant distance.  Chronic atrial fibrillation: rate controlled  Gastroesophageal reflux disease without esophagitis: asymptomatic    Medications: Patient's Medications  New Prescriptions   No medications on file  Previous Medications   ASCORBIC ACID (VITAMIN C) 1000 MG TABLET    Take 1,000 mg by mouth daily.   ASPIRIN EC 81 MG TABLET    Take 81 mg by mouth daily.   DULOXETINE (CYMBALTA) 30 MG CAPSULE    TAKE ONE CAPSULE BY MOUTH EVERY DAY   FERROUS SULFATE 325 (65 FE) MG TABLET    Take 1 tablet (325 mg total) by mouth daily with breakfast.   IBUPROFEN (ADVIL) 200 MG TABLET    Take 200 mg by mouth every 6 (six) hours as needed for pain.   KLOR-CON 10 10 MEQ TABLET    1 BY MOUTH DAILY   LEVOTHYROXINE (SYNTHROID, LEVOTHROID) 75 MCG TABLET    TAKE 1 TABLET BY MOUTH EVERY DAY FOR THYROID   LOSARTAN-HYDROCHLOROTHIAZIDE (HYZAAR) 100-25 MG PER TABLET    TAKE 1 TABLET BY MOUTH ONCE DAILY TO CONTROL BLOOD PRESSURE   METOPROLOL SUCCINATE (TOPROL-XL) 50 MG 24 HR TABLET    TAKE 1 TABLET BY MOUTH EVERY  DAY   MULTIPLE VITAMIN (MULITIVITAMIN WITH MINERALS) TABS    Take 1 tablet by mouth daily.   OMEPRAZOLE (PRILOSEC) 20 MG CAPSULE    TAKE ONE CAPSULE BY MOUTH TWICE A DAY   OXYCODONE (OXY IR/ROXICODONE) 5 MG IMMEDIATE RELEASE TABLET    Take 5 mg by mouth every 6 (six) hours.   SENNOSIDES-DOCUSATE SODIUM (SENOKOT-S) 8.6-50 MG TABLET    Take 1 tablet by mouth as needed for constipation.    TDAP (BOOSTRIX) 5-2.5-18.5 LF-MCG/0.5 INJECTION    Inject 0.5 mLs into the muscle once.   TROLAMINE SALICYLATE (ASPERCREME/ALOE) 10 % CREAM    Apply 1 application topically as needed for muscle pain. Apply to painful elbow 4 times daily  Modified Medications   No medications on file  Discontinued Medications   CLINDAMYCIN (CLEOCIN) 300 MG CAPSULE    Take 1 capsule (300 mg total) by mouth 3 (three) times daily. X 7 days     Review of Systems  Constitutional: Positive for activity change and fatigue. Negative for fever, chills, appetite change and unexpected weight change.  HENT: Positive for hearing loss and tinnitus. Negative for congestion and ear pain.   Eyes: Negative.   Respiratory: Negative.   Cardiovascular: Negative.   Gastrointestinal: Negative.   Genitourinary:       Urinary leakage.  Musculoskeletal: Negative for neck pain and neck stiffness.       Chronic, debilitating back pains which severely affects her mobility. There is generalized muscle weakness particularly below the waist. She has joint pains in the hips and knees. There is some restriction of motion at the knees and perhaps the hips as well. She is status post fracture left femur at the hip on 01/26/13. Residual pain is still present. She is very unsteady on standing.  Skin: Positive for rash.       Scarring from prior left heel ucer  Neurological: Positive for weakness. Negative for tremors, seizures, syncope and speech difficulty.       Difficulty with balance. Unable to walk.  Hematological: Negative.   Psychiatric/Behavioral:  Positive for sleep disturbance.       Early morning awakening.    Filed Vitals:   07/07/14 1353  BP: 158/84  Pulse: 93  Temp: 98.1 F (36.7 C)  TempSrc: Oral  Resp: 14  Height: $Remove'5\' 3"'wKCUnuk$  (1.6 m)  Weight: 135 lb 9.6 oz (61.508 kg)   Body mass index is 24.03 kg/(m^2).  Physical Exam  Constitutional: She is oriented to person, place, and time. She appears distressed.  Frail. Elderly. Thin body habitus.  HENT:  Head: Normocephalic and atraumatic.  Right Ear: External ear normal.  Left Ear: External ear normal.  Nose: Nose normal.  Mouth/Throat: Oropharynx is clear and moist.  Significant loss of hearing bilaterally. Wears hearing aids bilaterally.  Eyes: EOM are normal. Pupils are equal, round, and reactive to light.  Wears corrective lenses.  Neck: Neck supple. No JVD present. No tracheal deviation present. No thyromegaly present.  Cardiovascular: Normal rate, regular rhythm and normal heart sounds.  Exam reveals no gallop and no friction rub.   No murmur heard. Diminished DP and PT  Pulmonary/Chest: No respiratory distress. She has no wheezes. She has rales. She exhibits no tenderness.  Dry rales bilaterally throughout the lung fields.  Abdominal: She exhibits no distension and no mass. There is no tenderness.  Musculoskeletal: She exhibits edema and tenderness.  Restricted movement due to poor balance and continued pain in the right hip and back. Lives in a wheelchair. She can stand with significant assistance from others. She no longer walks.  Lymphadenopathy:    She has no cervical adenopathy.  Neurological: She is alert and oriented to person, place, and time. No cranial nerve deficit. Coordination normal.  Skin: No rash noted. No erythema. No pallor.  Pressure ulcer of the heel has healed, but there is residual discomfort. Blush area in the RLQ of abd. Scaly area on the right buttock.  Psychiatric: She has a normal mood and affect. Her behavior is normal. Judgment and  thought content normal.  Mildly depressed affect.     Labs reviewed: Appointment on 07/05/2014  Component Date Value Ref Range Status  . WBC 07/05/2014 6.8  3.4 - 10.8 x10E3/uL Final  . RBC 07/05/2014 3.82  3.77 - 5.28 x10E6/uL Final  . Hemoglobin 07/05/2014 10.8* 11.1 - 15.9 g/dL Final  . HCT 07/05/2014 34.3  34.0 - 46.6 % Final  . MCV 07/05/2014 90  79 - 97 fL Final  . MCH 07/05/2014 28.3  26.6 - 33.0 pg Final  . MCHC 07/05/2014 31.5  31.5 - 35.7 g/dL Final  . RDW 07/05/2014 14.7  12.3 - 15.4 % Final  . Platelets 07/05/2014 208  150 - 379 x10E3/uL Final  . Neutrophils Relative % 07/05/2014 55   Final  . Lymphs  07/05/2014 28   Final  . Monocytes 07/05/2014 9   Final  . Eos 07/05/2014 7   Final  . Basos 07/05/2014 1   Final  . Neutrophils Absolute 07/05/2014 3.8  1.4 - 7.0 x10E3/uL Final  . Lymphocytes Absolute 07/05/2014 1.9  0.7 - 3.1 x10E3/uL Final  . Monocytes Absolute 07/05/2014 0.6  0.1 - 0.9 x10E3/uL Final  . Eosinophils Absolute 07/05/2014 0.5* 0.0 - 0.4 x10E3/uL Final  . Basophils Absolute 07/05/2014 0.1  0.0 - 0.2 x10E3/uL Final  . Immature Granulocytes 07/05/2014 0   Final  . Immature Grans (Abs) 07/05/2014 0.0  0.0 - 0.1 x10E3/uL Final  . Glucose 07/05/2014 87  65 - 99 mg/dL Final  . BUN 07/05/2014 22  10 - 36 mg/dL Final  . Creatinine, Ser 07/05/2014 0.95  0.57 - 1.00 mg/dL Final  . GFR calc non Af Amer 07/05/2014 51* >59 mL/min/1.73 Final  . GFR calc Af Amer 07/05/2014 59* >59 mL/min/1.73 Final  . BUN/Creatinine Ratio 07/05/2014 23  11 - 26 Final  . Sodium 07/05/2014 141  134 - 144 mmol/L Final  . Potassium 07/05/2014 4.1  3.5 - 5.2 mmol/L Final  . Chloride 07/05/2014 99  97 - 108 mmol/L Final  . CO2 07/05/2014 25  18 - 29 mmol/L Final  . Calcium 07/05/2014 9.6  8.7 - 10.3 mg/dL Final  . Total Protein 07/05/2014 6.3  6.0 - 8.5 g/dL Final  . Albumin 07/05/2014 4.1  3.2 - 4.6 g/dL Final  . Globulin, Total 07/05/2014 2.2  1.5 - 4.5 g/dL Final  . Albumin/Globulin  Ratio 07/05/2014 1.9  1.1 - 2.5 Final  . Total Bilirubin 07/05/2014 0.3  0.0 - 1.2 mg/dL Final  . Alkaline Phosphatase 07/05/2014 110  39 - 117 IU/L Final  . AST 07/05/2014 19  0 - 40 IU/L Final  . ALT 07/05/2014 10  0 - 32 IU/L Final  . TSH 07/05/2014 1.710  0.450 - 4.500 uIU/mL Final  Admission on 06/11/2014, Discharged on 06/11/2014  Component Date Value Ref Range Status  . WBC 06/11/2014 10.0  4.0 - 10.5 K/uL Final  . RBC 06/11/2014 3.64* 3.87 - 5.11 MIL/uL Final  . Hemoglobin 06/11/2014 11.1* 12.0 - 15.0 g/dL Final  . HCT 06/11/2014 33.8* 36.0 - 46.0 % Final  . MCV 06/11/2014 92.9  78.0 - 100.0 fL Final  . MCH 06/11/2014 30.5  26.0 - 34.0 pg Final  . MCHC 06/11/2014 32.8  30.0 - 36.0 g/dL Final  . RDW 06/11/2014 13.8  11.5 - 15.5 % Final  . Platelets 06/11/2014 194  150 - 400 K/uL Final  . Neutrophils Relative % 06/11/2014 78* 43 - 77 % Final  . Neutro Abs 06/11/2014 7.8* 1.7 - 7.7 K/uL Final  . Lymphocytes Relative 06/11/2014 14  12 - 46 % Final  . Lymphs Abs 06/11/2014 1.4  0.7 - 4.0 K/uL Final  . Monocytes Relative 06/11/2014 6  3 - 12 % Final  . Monocytes Absolute 06/11/2014 0.6  0.1 - 1.0 K/uL Final  . Eosinophils Relative 06/11/2014 2  0 - 5 % Final  . Eosinophils Absolute 06/11/2014 0.2  0.0 - 0.7 K/uL Final  . Basophils Relative 06/11/2014 0  0 - 1 % Final  . Basophils Absolute 06/11/2014 0.0  0.0 - 0.1 K/uL Final  . Sodium 06/11/2014 136* 137 - 147 mEq/L Final  . Potassium 06/11/2014 4.0  3.7 - 5.3 mEq/L Final  . Chloride 06/11/2014 99  96 - 112 mEq/L Final  .  CO2 06/11/2014 23  19 - 32 mEq/L Final  . Glucose, Bld 06/11/2014 183* 70 - 99 mg/dL Final  . BUN 06/11/2014 18  6 - 23 mg/dL Final  . Creatinine, Ser 06/11/2014 0.80  0.50 - 1.10 mg/dL Final  . Calcium 06/11/2014 9.9  8.4 - 10.5 mg/dL Final  . Total Protein 06/11/2014 7.1  6.0 - 8.3 g/dL Final  . Albumin 06/11/2014 3.7  3.5 - 5.2 g/dL Final  . AST 06/11/2014 21  0 - 37 U/L Final  . ALT 06/11/2014 16  0 - 35  U/L Final  . Alkaline Phosphatase 06/11/2014 133* 39 - 117 U/L Final  . Total Bilirubin 06/11/2014 0.6  0.3 - 1.2 mg/dL Final  . GFR calc non Af Amer 06/11/2014 61* >90 mL/min Final  . GFR calc Af Amer 06/11/2014 70* >90 mL/min Final   Comment: (NOTE)                          The eGFR has been calculated using the CKD EPI equation.                          This calculation has not been validated in all clinical situations.                          eGFR's persistently <90 mL/min signify possible Chronic Kidney                          Disease.      Assessment/Plan  1. Ulcer of heel, left, limited to breakdown of skin Healed, but area remains painful  2. Hypothyroidism, unspecified hypothyroidism type - TSH; Future  3. Essential hypertension - Comprehensive metabolic panel; Future  4. Edema unchanged  5. Congestive heart failure, unspecified compensated  6. Anemia, unspecified - CBC With differential/Platelet; Future - Reticulocytes; Future  7. Abnormality of gait Continue use of wheelchair  8. Chronic atrial fibrillation Rate controlled. She is only on ASA due to fall risk.  9. Gastroesophageal reflux disease without esophagitis Asymptomatic  10.CKD: BUN and creatinine are normal, but GFR is low.

## 2014-07-11 ENCOUNTER — Other Ambulatory Visit: Payer: Self-pay | Admitting: Internal Medicine

## 2014-07-30 ENCOUNTER — Other Ambulatory Visit: Payer: Self-pay | Admitting: Internal Medicine

## 2014-08-02 ENCOUNTER — Other Ambulatory Visit: Payer: Self-pay | Admitting: Internal Medicine

## 2014-08-09 ENCOUNTER — Other Ambulatory Visit: Payer: Self-pay | Admitting: Internal Medicine

## 2014-09-08 ENCOUNTER — Other Ambulatory Visit: Payer: Self-pay | Admitting: Internal Medicine

## 2014-09-15 ENCOUNTER — Encounter (HOSPITAL_BASED_OUTPATIENT_CLINIC_OR_DEPARTMENT_OTHER): Payer: Medicare Other | Attending: General Surgery

## 2014-09-15 DIAGNOSIS — K219 Gastro-esophageal reflux disease without esophagitis: Secondary | ICD-10-CM | POA: Insufficient documentation

## 2014-09-15 DIAGNOSIS — L03119 Cellulitis of unspecified part of limb: Secondary | ICD-10-CM | POA: Diagnosis not present

## 2014-09-15 DIAGNOSIS — A419 Sepsis, unspecified organism: Secondary | ICD-10-CM | POA: Diagnosis not present

## 2014-09-15 DIAGNOSIS — E039 Hypothyroidism, unspecified: Secondary | ICD-10-CM | POA: Diagnosis not present

## 2014-09-15 DIAGNOSIS — Z79899 Other long term (current) drug therapy: Secondary | ICD-10-CM | POA: Insufficient documentation

## 2014-09-15 DIAGNOSIS — L02619 Cutaneous abscess of unspecified foot: Secondary | ICD-10-CM | POA: Insufficient documentation

## 2014-09-15 DIAGNOSIS — Z90710 Acquired absence of both cervix and uterus: Secondary | ICD-10-CM | POA: Insufficient documentation

## 2014-09-15 DIAGNOSIS — I10 Essential (primary) hypertension: Secondary | ICD-10-CM | POA: Insufficient documentation

## 2014-09-15 DIAGNOSIS — Z7982 Long term (current) use of aspirin: Secondary | ICD-10-CM | POA: Insufficient documentation

## 2014-09-15 DIAGNOSIS — Z954 Presence of other heart-valve replacement: Secondary | ICD-10-CM | POA: Insufficient documentation

## 2014-09-15 DIAGNOSIS — R109 Unspecified abdominal pain: Secondary | ICD-10-CM | POA: Diagnosis not present

## 2014-09-16 ENCOUNTER — Inpatient Hospital Stay (HOSPITAL_COMMUNITY)
Admission: EM | Admit: 2014-09-16 | Discharge: 2014-09-21 | DRG: 872 | Disposition: A | Discharge status: Home Health | Payer: Medicare Other | Attending: Internal Medicine | Admitting: Internal Medicine

## 2014-09-16 ENCOUNTER — Emergency Department (HOSPITAL_COMMUNITY): Payer: Medicare Other

## 2014-09-16 ENCOUNTER — Encounter (HOSPITAL_COMMUNITY): Payer: Self-pay | Admitting: Emergency Medicine

## 2014-09-16 DIAGNOSIS — Z823 Family history of stroke: Secondary | ICD-10-CM | POA: Diagnosis not present

## 2014-09-16 DIAGNOSIS — R0600 Dyspnea, unspecified: Secondary | ICD-10-CM

## 2014-09-16 DIAGNOSIS — Z7982 Long term (current) use of aspirin: Secondary | ICD-10-CM | POA: Diagnosis not present

## 2014-09-16 DIAGNOSIS — D649 Anemia, unspecified: Secondary | ICD-10-CM

## 2014-09-16 DIAGNOSIS — I482 Chronic atrial fibrillation, unspecified: Secondary | ICD-10-CM

## 2014-09-16 DIAGNOSIS — I5032 Chronic diastolic (congestive) heart failure: Secondary | ICD-10-CM | POA: Diagnosis present

## 2014-09-16 DIAGNOSIS — E785 Hyperlipidemia, unspecified: Secondary | ICD-10-CM

## 2014-09-16 DIAGNOSIS — K8020 Calculus of gallbladder without cholecystitis without obstruction: Secondary | ICD-10-CM | POA: Diagnosis present

## 2014-09-16 DIAGNOSIS — M545 Low back pain, unspecified: Secondary | ICD-10-CM | POA: Diagnosis present

## 2014-09-16 DIAGNOSIS — I2510 Atherosclerotic heart disease of native coronary artery without angina pectoris: Secondary | ICD-10-CM | POA: Diagnosis present

## 2014-09-16 DIAGNOSIS — M25521 Pain in right elbow: Secondary | ICD-10-CM

## 2014-09-16 DIAGNOSIS — R0602 Shortness of breath: Secondary | ICD-10-CM

## 2014-09-16 DIAGNOSIS — I10 Essential (primary) hypertension: Secondary | ICD-10-CM | POA: Diagnosis present

## 2014-09-16 DIAGNOSIS — Z952 Presence of prosthetic heart valve: Secondary | ICD-10-CM

## 2014-09-16 DIAGNOSIS — R269 Unspecified abnormalities of gait and mobility: Secondary | ICD-10-CM

## 2014-09-16 DIAGNOSIS — L8992 Pressure ulcer of unspecified site, stage 2: Secondary | ICD-10-CM | POA: Diagnosis present

## 2014-09-16 DIAGNOSIS — R1310 Dysphagia, unspecified: Secondary | ICD-10-CM | POA: Diagnosis present

## 2014-09-16 DIAGNOSIS — E559 Vitamin D deficiency, unspecified: Secondary | ICD-10-CM

## 2014-09-16 DIAGNOSIS — L97409 Non-pressure chronic ulcer of unspecified heel and midfoot with unspecified severity: Secondary | ICD-10-CM | POA: Diagnosis present

## 2014-09-16 DIAGNOSIS — K8071 Calculus of gallbladder and bile duct without cholecystitis with obstruction: Secondary | ICD-10-CM | POA: Diagnosis present

## 2014-09-16 DIAGNOSIS — Z96649 Presence of unspecified artificial hip joint: Secondary | ICD-10-CM | POA: Diagnosis not present

## 2014-09-16 DIAGNOSIS — L03119 Cellulitis of unspecified part of limb: Secondary | ICD-10-CM | POA: Diagnosis present

## 2014-09-16 DIAGNOSIS — Z807 Family history of other malignant neoplasms of lymphoid, hematopoietic and related tissues: Secondary | ICD-10-CM

## 2014-09-16 DIAGNOSIS — A419 Sepsis, unspecified organism: Principal | ICD-10-CM

## 2014-09-16 DIAGNOSIS — Z885 Allergy status to narcotic agent status: Secondary | ICD-10-CM

## 2014-09-16 DIAGNOSIS — E876 Hypokalemia: Secondary | ICD-10-CM

## 2014-09-16 DIAGNOSIS — Z881 Allergy status to other antibiotic agents status: Secondary | ICD-10-CM | POA: Diagnosis not present

## 2014-09-16 DIAGNOSIS — K8033 Calculus of bile duct with acute cholangitis with obstruction: Secondary | ICD-10-CM

## 2014-09-16 DIAGNOSIS — I447 Left bundle-branch block, unspecified: Secondary | ICD-10-CM

## 2014-09-16 DIAGNOSIS — R609 Edema, unspecified: Secondary | ICD-10-CM

## 2014-09-16 DIAGNOSIS — I509 Heart failure, unspecified: Secondary | ICD-10-CM

## 2014-09-16 DIAGNOSIS — I4891 Unspecified atrial fibrillation: Secondary | ICD-10-CM | POA: Diagnosis present

## 2014-09-16 DIAGNOSIS — Z888 Allergy status to other drugs, medicaments and biological substances status: Secondary | ICD-10-CM | POA: Diagnosis not present

## 2014-09-16 DIAGNOSIS — E039 Hypothyroidism, unspecified: Secondary | ICD-10-CM | POA: Diagnosis present

## 2014-09-16 DIAGNOSIS — Z801 Family history of malignant neoplasm of trachea, bronchus and lung: Secondary | ICD-10-CM

## 2014-09-16 DIAGNOSIS — K8051 Calculus of bile duct without cholangitis or cholecystitis with obstruction: Secondary | ICD-10-CM | POA: Diagnosis present

## 2014-09-16 DIAGNOSIS — L89609 Pressure ulcer of unspecified heel, unspecified stage: Secondary | ICD-10-CM | POA: Diagnosis present

## 2014-09-16 DIAGNOSIS — G4700 Insomnia, unspecified: Secondary | ICD-10-CM

## 2014-09-16 DIAGNOSIS — K8309 Other cholangitis: Secondary | ICD-10-CM | POA: Diagnosis present

## 2014-09-16 DIAGNOSIS — L02619 Cutaneous abscess of unspecified foot: Secondary | ICD-10-CM | POA: Diagnosis present

## 2014-09-16 DIAGNOSIS — G8929 Other chronic pain: Secondary | ICD-10-CM | POA: Diagnosis present

## 2014-09-16 DIAGNOSIS — R21 Rash and other nonspecific skin eruption: Secondary | ICD-10-CM

## 2014-09-16 DIAGNOSIS — R109 Unspecified abdominal pain: Secondary | ICD-10-CM | POA: Diagnosis present

## 2014-09-16 LAB — URINALYSIS, ROUTINE W REFLEX MICROSCOPIC
Glucose, UA: NEGATIVE mg/dL
Hgb urine dipstick: NEGATIVE
KETONES UR: NEGATIVE mg/dL
Leukocytes, UA: NEGATIVE
NITRITE: NEGATIVE
PH: 6 (ref 5.0–8.0)
PROTEIN: 100 mg/dL — AB
Specific Gravity, Urine: 1.036 — ABNORMAL HIGH (ref 1.005–1.030)
Urobilinogen, UA: 1 mg/dL (ref 0.0–1.0)

## 2014-09-16 LAB — COMPREHENSIVE METABOLIC PANEL
ALT: 195 U/L — ABNORMAL HIGH (ref 0–35)
AST: 240 U/L — ABNORMAL HIGH (ref 0–37)
Albumin: 3.5 g/dL (ref 3.5–5.2)
Alkaline Phosphatase: 321 U/L — ABNORMAL HIGH (ref 39–117)
Anion gap: 17 — ABNORMAL HIGH (ref 5–15)
BUN: 16 mg/dL (ref 6–23)
CHLORIDE: 97 meq/L (ref 96–112)
CO2: 23 meq/L (ref 19–32)
CREATININE: 0.76 mg/dL (ref 0.50–1.10)
Calcium: 10.1 mg/dL (ref 8.4–10.5)
GFR, EST AFRICAN AMERICAN: 80 mL/min — AB (ref >90)
GFR, EST NON AFRICAN AMERICAN: 69 mL/min — AB (ref >90)
GLUCOSE: 173 mg/dL — AB (ref 70–99)
Potassium: 3.8 mEq/L (ref 3.7–5.3)
Sodium: 137 mEq/L (ref 137–147)
Total Bilirubin: 2.3 mg/dL — ABNORMAL HIGH (ref 0.3–1.2)
Total Protein: 6.9 g/dL (ref 6.0–8.3)

## 2014-09-16 LAB — TROPONIN I: Troponin I: <0.3 ng/mL (ref <0.30)

## 2014-09-16 LAB — CBC WITH DIFFERENTIAL/PLATELET
Basophils Absolute: 0 10*3/uL (ref 0.0–0.1)
Basophils Relative: 0 % (ref 0–1)
EOS PCT: 1 % (ref 0–5)
Eosinophils Absolute: 0.1 10*3/uL (ref 0.0–0.7)
HCT: 34.8 % — ABNORMAL LOW (ref 36.0–46.0)
HEMOGLOBIN: 11.4 g/dL — AB (ref 12.0–15.0)
LYMPHS ABS: 0.9 10*3/uL (ref 0.7–4.0)
Lymphocytes Relative: 6 % — ABNORMAL LOW (ref 12–46)
MCH: 29 pg (ref 26.0–34.0)
MCHC: 32.8 g/dL (ref 30.0–36.0)
MCV: 88.5 fL (ref 78.0–100.0)
MONO ABS: 1 10*3/uL (ref 0.1–1.0)
MONOS PCT: 7 % (ref 3–12)
NEUTROS ABS: 11.9 10*3/uL — AB (ref 1.7–7.7)
Neutrophils Relative %: 86 % — ABNORMAL HIGH (ref 43–77)
Platelets: 193 10*3/uL (ref 150–400)
RBC: 3.93 MIL/uL (ref 3.87–5.11)
RDW: 14.1 % (ref 11.5–15.5)
WBC: 13.9 10*3/uL — AB (ref 4.0–10.5)

## 2014-09-16 LAB — URINE MICROSCOPIC-ADD ON

## 2014-09-16 LAB — MRSA PCR SCREENING: MRSA BY PCR: NEGATIVE

## 2014-09-16 LAB — I-STAT TROPONIN, ED
Troponin i, poc: 0.01 ng/mL (ref 0.00–0.08)
Troponin i, poc: 0.05 ng/mL (ref 0.00–0.08)

## 2014-09-16 LAB — LIPASE, BLOOD: LIPASE: 11 U/L (ref 11–59)

## 2014-09-16 LAB — I-STAT CG4 LACTIC ACID, ED: LACTIC ACID, VENOUS: 1.82 mmol/L (ref 0.5–2.2)

## 2014-09-16 MED ORDER — OXYCODONE HCL 5 MG PO TABS
5.0000 mg | ORAL_TABLET | Freq: Once | ORAL | Status: AC
  Administered 2014-09-16: 5 mg via ORAL
  Filled 2014-09-16: qty 1

## 2014-09-16 MED ORDER — PIPERACILLIN-TAZOBACTAM 3.375 G IVPB
3.3750 g | Freq: Three times a day (TID) | INTRAVENOUS | Status: DC
  Administered 2014-09-16 – 2014-09-21 (×15): 3.375 g via INTRAVENOUS
  Filled 2014-09-16 (×18): qty 50

## 2014-09-16 MED ORDER — ONDANSETRON HCL 4 MG/2ML IJ SOLN: 4.0000 mg | Freq: Four times a day (QID) | INTRAMUSCULAR | Status: DC | PRN

## 2014-09-16 MED ORDER — SODIUM CHLORIDE 0.9 % IV SOLN: INTRAVENOUS | Status: DC

## 2014-09-16 MED ORDER — ACETAMINOPHEN 500 MG PO TABS
1000.0000 mg | ORAL_TABLET | Freq: Once | ORAL | Status: AC
Start: 2014-09-16 — End: 2014-09-16
  Administered 2014-09-16: 1000 mg via ORAL
  Filled 2014-09-16: qty 2

## 2014-09-16 MED ORDER — LEVOFLOXACIN IN D5W 750 MG/150ML IV SOLN
750.0000 mg | Freq: Once | INTRAVENOUS | Status: AC
  Administered 2014-09-16: 750 mg via INTRAVENOUS
  Filled 2014-09-16: qty 150

## 2014-09-16 MED ORDER — PIPERACILLIN-TAZOBACTAM 3.375 G IVPB 30 MIN
3.3750 g | Freq: Once | INTRAVENOUS | Status: AC
  Administered 2014-09-16: 3.375 g via INTRAVENOUS
  Filled 2014-09-16: qty 50

## 2014-09-16 MED ORDER — SODIUM CHLORIDE 0.9 % IV BOLUS (SEPSIS)
1000.0000 mL | Freq: Once | INTRAVENOUS | Status: AC
  Administered 2014-09-16: 1000 mL via INTRAVENOUS

## 2014-09-16 MED ORDER — IOHEXOL 300 MG/ML  SOLN
25.0000 mL | Freq: Once | INTRAMUSCULAR | Status: AC | PRN
  Administered 2014-09-16: 25 mL via ORAL

## 2014-09-16 MED ORDER — PNEUMOCOCCAL VAC POLYVALENT 25 MCG/0.5ML IJ INJ
0.5000 mL | INJECTION | INTRAMUSCULAR | Status: DC | PRN
Start: 2014-09-17 — End: 2014-09-21

## 2014-09-16 MED ORDER — ONDANSETRON HCL 4 MG/2ML IJ SOLN
4.0000 mg | Freq: Once | INTRAMUSCULAR | Status: AC
  Administered 2014-09-16: 4 mg via INTRAVENOUS
  Filled 2014-09-16: qty 2

## 2014-09-16 MED ORDER — PANTOPRAZOLE SODIUM 40 MG IV SOLR
40.0000 mg | Freq: Two times a day (BID) | INTRAVENOUS | Status: DC
  Administered 2014-09-16: 40 mg via INTRAVENOUS
  Filled 2014-09-16: qty 40

## 2014-09-16 MED ORDER — INFLUENZA VAC SPLIT QUAD 0.5 ML IM SUSY
0.5000 mL | PREFILLED_SYRINGE | INTRAMUSCULAR | Status: AC | PRN
  Administered 2014-09-21: 0.5 mL via INTRAMUSCULAR

## 2014-09-16 MED ORDER — MORPHINE SULFATE 2 MG/ML IJ SOLN: 1.0000 mg | INTRAMUSCULAR | Status: DC | PRN

## 2014-09-16 MED ORDER — ONDANSETRON HCL 4 MG PO TABS: 4.0000 mg | ORAL_TABLET | Freq: Four times a day (QID) | ORAL | Status: DC | PRN

## 2014-09-16 MED ORDER — LEVOTHYROXINE SODIUM 100 MCG IV SOLR
25.0000 ug | Freq: Every day | INTRAVENOUS | Status: DC
  Administered 2014-09-16: 25 ug via INTRAVENOUS
  Filled 2014-09-16 (×2): qty 5

## 2014-09-16 MED ORDER — OXYCODONE HCL 5 MG PO TABS
5.0000 mg | ORAL_TABLET | Freq: Four times a day (QID) | ORAL | Status: DC
  Administered 2014-09-16 – 2014-09-17 (×3): 5 mg via ORAL
  Filled 2014-09-16 (×3): qty 1

## 2014-09-16 MED ORDER — METOPROLOL TARTRATE 1 MG/ML IV SOLN
5.0000 mg | Freq: Two times a day (BID) | INTRAVENOUS | Status: DC
  Filled 2014-09-16: qty 5

## 2014-09-16 MED ORDER — METOPROLOL TARTRATE 1 MG/ML IV SOLN
5.0000 mg | Freq: Four times a day (QID) | INTRAVENOUS | Status: DC
  Administered 2014-09-16 – 2014-09-17 (×4): 5 mg via INTRAVENOUS
  Filled 2014-09-16 (×7): qty 5

## 2014-09-16 MED ORDER — ENOXAPARIN SODIUM 30 MG/0.3ML ~~LOC~~ SOLN
30.0000 mg | SUBCUTANEOUS | Status: DC
  Administered 2014-09-16 – 2014-09-20 (×5): 30 mg via SUBCUTANEOUS
  Filled 2014-09-16 (×6): qty 0.3

## 2014-09-16 MED ORDER — IOHEXOL 300 MG/ML  SOLN
80.0000 mL | Freq: Once | INTRAMUSCULAR | Status: AC | PRN
  Administered 2014-09-16: 80 mL via INTRAVENOUS

## 2014-09-16 NOTE — ED Notes (Signed)
Paged CT

## 2014-09-16 NOTE — Progress Notes (Signed)
ANTIBIOTIC CONSULT NOTE - INITIAL  Pharmacy Consult for zosyn Indication: intraabdominal infection  Allergies  Allergen Reactions  . Accupril [Quinapril Hcl]   . Cephalexin     diarrhea  . Clarithromycin   . Codeine   . Demerol   . Fentanyl   . Lexapro [Escitalopram Oxalate]   . Monistat [Miconazole Nitrate-Wipes]   . Prednisone   . Tagamet [Cimetidine]     Patient Measurements:   Body Weight: 61.5 kg  Vital Signs: Temp: 101 F (38.3 C) (09/17 0256) Temp src: Rectal (09/17 0256) BP: 139/58 mmHg (09/17 0845) Pulse Rate: 78 (09/17 0845) Intake/Output from previous day:   Intake/Output from this shift: Total I/O In: 1000 [I.V.:1000] Out: -   Labs:  Recent Labs  09/16/14 0220  WBC 13.9*  HGB 11.4*  PLT 193  CREATININE 0.76   The CrCl is unknown because both a height and weight (above a minimum accepted value) are required for this calculation. No results found for this basename: VANCOTROUGH, VANCOPEAK, VANCORANDOM, GENTTROUGH, GENTPEAK, GENTRANDOM, TOBRATROUGH, TOBRAPEAK, TOBRARND, AMIKACINPEAK, AMIKACINTROU, AMIKACIN,  in the last 72 hours   Microbiology: No results found for this or any previous visit (from the past 720 hour(s)).  Medical History: Past Medical History  Diagnosis Date  . Atrial flutter   . Hypothyroidism   . Hypertension   . Edema     Leg  . Atrial fibrillation     not on coumadin-managed on meciaitons.  . SOB (shortness of breath) 12/16/10    At rest-no oxygen at rest  . Chronic lung disease     Stable  . Cardiomegaly     Stable  . Disc disease, degenerative, lumbar or lumbosacral     Dr. Marcha [facility]-Last Steroids 5 yr ago  . Mixed hearing loss, bilateral   . Ingrowing nail 03/04/2013  . Congestive heart failure, unspecified   . Impacted cerumen 05/13/2012  . Other malaise and fatigue   . Pressure ulcer, heel(707.07)   . Hypopotassemia   . Unspecified constipation   . Hypoxemia   . Other abnormal blood chemistry    . Open fracture of unspecified part of neck of femur   . Other chronic pain   . Atherosclerosis of native arteries of the extremities, unspecified   . Scoliosis (and kyphoscoliosis), idiopathic   . Nausea alone   . Urinary frequency   . Proteinuria   . Senile osteoporosis   . Unspecified vitamin D deficiency   . Herpes zoster with other nervous system complications(053.19)   . Depressive disorder, not elsewhere classified   . Cervicalgia   . Dysphagia, unspecified(787.20)   . Sciatica   . Anemia, unspecified   . Lumbago   . Allergic rhinitis due to pollen   . Abnormality of gait   . Neoplasm of uncertain behavior of skin   . Esophageal reflux   . Mononeuritis of unspecified site   . Diaphragmatic hernia without mention of obstruction or gangrene   . Pain in joint, pelvic region and thigh   . Other and unspecified hyperlipidemia   . Encounter for long-term (current) use of other medications   . Osteoarthrosis, unspecified whether generalized or localized, lower leg   . Other left bundle branch block   . Other heart block   . Dizziness and giddiness   . Undiagnosed cardiac murmurs   . Insomnia, unspecified   . Neuralgia, postherpetic     Medications:  See med history Assessment: 80 yo lady admitted with abdominal pain and found  to have gallstones.  She will start zosyn.   Goal of Therapy:  Eradication of infection  Plan:  Zosyn 3.375 gm IV X 1 over 30 min then 3.375 gm IV q8 hours, each dose over 4 hours. F/u renal function, cultures and clinical course  Thanks for allowing pharmacy to be a part of this patient's care.  Excell Seltzer, PharmD Clinical Pharmacist, [phone] 09/16/2014,9:13 AM

## 2014-09-16 NOTE — Consult Note (Addendum)
CARDIOLOGY CONSULT NOTE  Patient ID: Vicki Velasquez MRN: [id number] DOB/AGE: Dec 12, 1916 80 y.o.  Admit date: 09/16/2014 Primary Physician: Dr. Nyoka Cowden Primary Cardiologist: Dr. Acie Fredrickson Reason for Consultation: Atrial fibrillation, CHF  HPI: 80 yo with chronic atrial fibrillation and aortic stenosis s/p bioprosthetic AVR presented today with epigastric pain.  Patient has had on and off epigastric pain x years.  However, the pain that began last night was far worse than in the past.  She developed nausea + RUQ/epigastric discomfort.  She came to the ER and has been found to have gallstones in the common bile duct with ascending cholangitis.  She is febrile with elevated WBCs.  So far, BP and HR stable.  HR in 70s currently.  Abdominal pain has subsided.  She had fleeting chest pain last night.  She has had exertional dyspnea x 2 months, noticing it when she walks around her house.  No orthopnea.   Review of systems complete and found to be negative unless listed above in HPI  Past Medical History: 1. Atrial fibrillation: Chronic.  She has not been anticoagulated.  2. Aortic stenosis s/p bioprosthetic AVR.  3. Chronic diastolic CHF: Echo (8/92) with EF 50-55%, bioprosthetic AVR appears normal, moderate MR, PA systolic pressure 46 mmHg.  4. Hypothyroidism 5. HTN  Family History  Problem Relation Age of Onset  . Stroke Mother   . Cancer Mother     LUNG  . Stroke Father   . Cancer Sister     BREAST,LUNG,LIVER  . Cancer Sister     LYMPHOMA    History   Social History  . Marital Status: Widowed    Spouse Name: N/A    Number of Children: N/A  . Years of Education: N/A   Occupational History  . Not on file.   Social History Main Topics  . Smoking status: Never Smoker   . Smokeless tobacco: Not on file  . Alcohol Use: No  . Drug Use: No  . Sexual Activity: Not on file   Other Topics Concern  . 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       Doesn't drive by herself for the past 2-3 years.  Has an aide come in to help clean the house occasionally.     Can cook, and clean , but less than before   Has been using the walker for about 2 years.      Dr.Botero-Neurosurgeon   Dr.Sallie Miller-Optometry   Dr.Hatcher-Neurology   Dr.Cylde Nolan-Dermatology   Dr.Phillips Carter-Orthopedics   Dr.Tysinger-Cardiology   Dr.Hendrickson-CVTS   Dr.Epes-Ophthalmology   Dr.Medoff-GI   Dr.Olin/Ramos-Ortho   Dr.Phillips-Pain management      (Not in a hospital admission)  Physical exam Blood pressure 131/68, pulse 78, temperature 101 F (38.3 C), temperature source Rectal, resp. rate 16, SpO2 94.00%. General: NAD Neck: JVP 8 cm, no thyromegaly or thyroid nodule.  Lungs: Crackles at bases bilaterally.  CV: Nondisplaced PMI.  Heart irregular S1/S2, no S3/S4, 2/6 SEM RUSB.  Trace ankle edema.  No carotid bruit.  Normal pedal pulses.  Abdomen: Soft, epigastric tenderness without rebound, no hepatosplenomegaly, no distention.  Skin: Intact without lesions or rashes.  Neurologic: Alert and oriented x 3.  Psych: Normal affect. Extremities: No clubbing or cyanosis.  HEENT: Normal.   Labs:   Lab Results  Component Value Date  WBC 13.9* 09/16/2014   HGB 11.4* 09/16/2014   HCT 34.8* 09/16/2014   MCV 88.5 09/16/2014   PLT 193 09/16/2014    Recent Labs Lab 09/16/14 0220  NA 137  K 3.8  CL 97  CO2 23  BUN 16  CREATININE 0.76  CALCIUM 10.1  PROT 6.9  BILITOT 2.3*  ALKPHOS 321*  ALT 195*  AST 240*  GLUCOSE 173*   Lab Results  Component Value Date   CKTOTAL 147 02/01/2012   CKMB 4.1* 02/01/2012   TROPONINI <0.30 09/16/2014     Radiology:  -CT abd/pelvis: Multiple stones in CBD with ductal dilatation.  Stones in GB with ductal dilatation.   EKG: Atrial fibrillation with LBBB  ASSESSMENT AND PLAN:  80 yo with chronic atrial fibrillation and aortic stenosis s/p  bioprosthetic AVR presented today with epigastric pain and has been found to have ascending cholangitis.  1. Cholangitis: Febrile, elevated WBCs, CT evidence for cholangitis.   - She will be undergoing ERCP per GI today.  - Continue NPO, abx.  2. Atrial fibrillation: Stable, HR in 70s currently.  Would continue metoprolol, given IV when NPO. She has not been anticoagulated.  3.  Chronic diastolic CHF: Increased exertional dyspnea last few months.  JVP around 8 on exam.  Would be careful and limit any IV fluid.  Reasonable to get echo to reassess EF if surgery were to be contemplated.  4. HTN: Would hold losartan/HCTZ for now.   We will see as needed, please call with questions.   Loralie Champagne 09/16/2014 11:36 AM

## 2014-09-16 NOTE — H&P (Signed)
[facility] History and Physical  Vicki Velasquez OFB:[id number] DOB: 05/20/1916 DOA: 09/16/2014  Referring physician: Dr Claudine Mouton.  PCP: Estill Dooms, MD   Chief Complaint: Abdominal pain, SOB.   HPI: Vicki Velasquez is a 80 y.o. female with PMH significant for A fib, COPD, aortic valve replacement porcine valve 7824, diastolic dysfunction who presents complaining of abdominal pain, and SOB. She has been having abdominal pain on and off for years, pain is getting more frequent and intense. She denies vomiting. Relates constipation. Patient was notice to have Bowel incontinence today. She relates chest pain, more epigastric pain on and off. She was tender on epigastric area. She relates dyspnea on and off.    Review of Systems:  Negative, except as per HPI.  Past Medical History  Diagnosis Date  . Atrial flutter   . Hypothyroidism   . Hypertension   . Edema     Leg  . Atrial fibrillation     not on coumadin-managed on meciaitons.  . SOB (shortness of breath) 12/16/10    At rest-no oxygen at rest  . Chronic lung disease     Stable  . Cardiomegaly     Stable  . Disc disease, degenerative, lumbar or lumbosacral     Dr. Marcha [facility]-Last Steroids 5 yr ago  . Mixed hearing loss, bilateral   . Ingrowing nail 03/04/2013  . Congestive heart failure, unspecified   . Impacted cerumen 05/13/2012  . Other malaise and fatigue   . Pressure ulcer, heel(707.07)   . Hypopotassemia   . Unspecified constipation   . Hypoxemia   . Other abnormal blood chemistry   . Open fracture of unspecified part of neck of femur   . Other chronic pain   . Atherosclerosis of native arteries of the extremities, unspecified   . Scoliosis (and kyphoscoliosis), idiopathic   . Nausea alone   . Urinary frequency   . Proteinuria   . Senile osteoporosis   . Unspecified vitamin D deficiency   . Herpes zoster with other nervous system complications(053.19)   . Depressive disorder, not elsewhere  classified   . Cervicalgia   . Dysphagia, unspecified(787.20)   . Sciatica   . Pain in limb   . Anemia, unspecified   . Lumbago   . Allergic rhinitis due to pollen   . Abnormality of gait   . Neoplasm of uncertain behavior of skin   . Other atopic dermatitis and related conditions   . Esophageal reflux   . Mononeuritis of unspecified site   . Diaphragmatic hernia without mention of obstruction or gangrene   . Pain in joint, pelvic region and thigh   . Other and unspecified hyperlipidemia   . Encounter for long-term (current) use of other medications   . Osteoarthrosis, unspecified whether generalized or localized, lower leg   . Other left bundle branch block   . Other heart block   . Dizziness and giddiness   . Other symptoms involving cardiovascular system   . Undiagnosed cardiac murmurs   . Insomnia, unspecified   . Neuralgia, postherpetic    Past Surgical History  Procedure Laterality Date  . Aortic valve replacement      2002 replaced with a pig vavle.    . Tumour      had surgery ion the 70's for it   . Hip arthroplasty  01/28/2012    Procedure: ARTHROPLASTY BIPOLAR HIP;  Surgeon: Johnn Hai, MD;  Location: [facility];  Service: Orthopedics;  Laterality: Left;  .  Partial hysterectomy  1961  . Appendectomy  1961  . Dilation and curettage of uterus  1959  . Supracervical hysterectomy for endometriosis  1960  . Resection right parietal meningioma  08/19/1990  . Cardiac catherization  10/1999    dr tysinger  . Aortic valve replacement with a porcine valve      DR HENDRICKSON  . Arthroscopy left knee      DR Eulas Post   . Cataract surgery right eye      DR EPPS  . Orif of fracture (l) femur    . Colonoscopy  04/05/2004    Diverticulosis, Dr.Medoff  . Esophagogastroduodenoscopy  10/03/2008  . Myelogram  10/01/2008    Cervical    Social History:  reports that she has never smoked. She does not have any smokeless tobacco history on file. She reports that she does not  drink alcohol. Her drug history is not on file.  Allergies  Allergen Reactions  . Accupril [Quinapril Hcl]   . Cephalexin     diarrhea  . Clarithromycin   . Codeine   . Demerol   . Fentanyl   . Lexapro [Escitalopram Oxalate]   . Monistat [Miconazole Nitrate-Wipes]   . Prednisone   . Tagamet [Cimetidine]     Family History  Problem Relation Age of Onset  . Stroke Mother   . Cancer Mother     LUNG  . Stroke Father   . Cancer Sister     BREAST,LUNG,LIVER  . Cancer Sister     LYMPHOMA     Prior to Admission medications   Medication Sig Start Date End Date Taking? Authorizing Provider  Ascorbic Acid (VITAMIN C) 1000 MG tablet Take 1,000 mg by mouth daily.    Historical Provider, MD  aspirin EC 81 MG tablet Take 81 mg by mouth daily.    Historical Provider, MD  DULoxetine (CYMBALTA) 30 MG capsule TAKE ONE CAPSULE BY MOUTH EVERY DAY 07/30/14   Estill Dooms, MD  ferrous sulfate 325 (65 FE) MG tablet Take 1 tablet (325 mg total) by mouth daily with breakfast. 01/30/12 01/29/13  Charlynne Cousins, MD  ibuprofen (ADVIL) 200 MG tablet Take 200 mg by mouth every 6 (six) hours as needed for pain.    Historical Provider, MD  KLOR-CON 10 10 MEQ tablet 1 BY MOUTH DAILY 03/04/14   Estill Dooms, MD  KLOR-CON 10 10 MEQ tablet TAKE 1 TABLET BY MOUTH EVERY DAY    Estill Dooms, MD  levothyroxine (SYNTHROID, LEVOTHROID) 75 MCG tablet TAKE 1 TABLET BY MOUTH EVERY DAY FOR THYROID 08/02/14   Estill Dooms, MD  losartan-hydrochlorothiazide (HYZAAR) 100-25 MG per tablet TAKE 1 TABLET BY MOUTH ONCE DAILY TO CONTROL BLOOD PRESSURE 09/08/14   Estill Dooms, MD  metoprolol succinate (TOPROL-XL) 50 MG 24 hr tablet TAKE 1 TABLET BY MOUTH EVERY DAY    Estill Dooms, MD  Multiple Vitamin (MULITIVITAMIN WITH MINERALS) TABS Take 1 tablet by mouth daily.    Historical Provider, MD  omeprazole (PRILOSEC) 20 MG capsule TAKE ONE CAPSULE BY MOUTH TWICE A DAY 05/26/14   Estill Dooms, MD  oxyCODONE (OXY  IR/ROXICODONE) 5 MG immediate release tablet Take 5 mg by mouth every 6 (six) hours. 06/29/13   Historical Provider, MD  senna (SENOKOT) 8.6 MG tablet Take 1 tablet by mouth.    Historical Provider, MD  sennosides-docusate sodium (SENOKOT-S) 8.6-50 MG tablet Take 1 tablet by mouth as needed for constipation.  Historical Provider, MD  TDaP Durwin Reges) 5-2.5-18.5 LF-MCG/0.5 injection Inject 0.5 mLs into the muscle once. 11/12/13   Estill Dooms, MD  trolamine salicylate (ASPERCREME/ALOE) 10 % cream Apply 1 application topically as needed for muscle pain. Apply to painful elbow 4 times daily 11/11/13   Estill Dooms, MD   Physical Exam: Filed Vitals:   09/16/14 0615 09/16/14 0714 09/16/14 0730 09/16/14 0745  BP: 150/91 143/72 131/52 136/61  Pulse: 99  92 84  Temp:      TempSrc:      Resp: 24 25 32 25  SpO2: 94% 91% 92% 91%    Wt Readings from Last 3 Encounters:  07/07/14 61.508 kg (135 lb 9.6 oz)  06/11/14 58.06 kg (128 lb)  03/10/14 66.134 kg (145 lb 12.8 oz)    General:  Appears calm and comfortable Eyes: PERRL, normal lids, irises & conjunctiva ENT: grossly normal hearing, lips & tongue Neck: no LAD, masses or thyromegaly Cardiovascular: RRR, no m/r/g. No LE edema. Telemetry: A fib.  Respiratory: CTA bilaterally, no w/r/r. Normal respiratory effort. Abdomen: soft, epigastric tenderness. No rigidity.  Skin: left heel with erythema, loss of superficial skin layer.  Musculoskeletal: grossly normal tone BUE/BLE Psychiatric: grossly normal mood and affect, speech fluent and appropriate Neurologic: grossly non-focal.          Labs on Admission:  Basic Metabolic Panel:  Recent Labs Lab 09/16/14 0220  NA 137  K 3.8  CL 97  CO2 23  GLUCOSE 173*  BUN 16  CREATININE 0.76  CALCIUM 10.1   Liver Function Tests:  Recent Labs Lab 09/16/14 0220  AST 240*  ALT 195*  ALKPHOS 321*  BILITOT 2.3*  PROT 6.9  ALBUMIN 3.5   No results found for this basename: LIPASE,  AMYLASE,  in the last 168 hours No results found for this basename: AMMONIA,  in the last 168 hours CBC:  Recent Labs Lab 09/16/14 0220  WBC 13.9*  NEUTROABS 11.9*  HGB 11.4*  HCT 34.8*  MCV 88.5  PLT 193   Cardiac Enzymes: No results found for this basename: CKTOTAL, CKMB, CKMBINDEX, TROPONINI,  in the last 168 hours  BNP (last 3 results) No results found for this basename: PROBNP,  in the last 8760 hours CBG: No results found for this basename: GLUCAP,  in the last 168 hours  Radiological Exams on Admission: Ct Chest W Contrast  09/16/2014   CLINICAL DATA:  Right upper quadrant pain, fever  EXAM: CT CHEST, ABDOMEN AND PELVIS WITHOUT CONTRAST  TECHNIQUE: Multidetector CT imaging of the chest, abdomen and pelvis was performed following the standard protocol without IV contrast.  COMPARISON:  None.  FINDINGS: CT CHEST FINDINGS  Increased interstitial lung markings diffusely and bilaterally suggesting chronic lung disease. Mild right lower lobe atelectasis.  Negative for pneumonia. Small bilateral pleural effusions. Negative for mass. 15 mm precarinal node. 12 mm AP window node. Additional smaller mediastinal nodes. Sub carinal 15 mm node.  Cardiac enlargement without pericardial effusion. Coronary artery calcification. Extensive atherosclerotic calcification in the aortic arch and great vessels.  CT ABDOMEN AND PELVIS FINDINGS  Gallbladder is markedly distended with mild gallbladder wall thickening. Large 25 mm partially calcified gallstone. Biliary duct dilatation. Intrahepatic and extrahepatic bile ducts are dilated. Common bile duct measures 13 mm. Multiple rounded filling defects are present in the common bile duct consistent with common duct stones causing obstruction.  Pancreatic atrophy without mass lesion. No focal liver lesion. Spleen is normal.  4 cm left renal  cyst.  No renal obstruction or stone.  Negative for bowel obstruction. Sigmoid diverticulosis. No mass in the abdomen. No  free fluid.  Lumbar scoliosis with multilevel spondylosis.  No fracture.  IMPRESSION: Increased lung markings diffusely suggesting chronic lung disease. Mediastinal adenopathy is noted without peripheral lung mass. Small bilateral pleural effusions.  Intrahepatic and extrahepatic biliary ductal dilatation with multiple common duct stones. Gallbladder is dilated with a 25 mm calcified stone and mild gallbladder wall thickening and hyper enhancement.   Electronically Signed   By: Franchot Gallo M.D.   On: 09/16/2014 07:37   Ct Abdomen Pelvis W Contrast  09/16/2014   CLINICAL DATA:  Right upper quadrant pain, fever  EXAM: CT CHEST, ABDOMEN AND PELVIS WITHOUT CONTRAST  TECHNIQUE: Multidetector CT imaging of the chest, abdomen and pelvis was performed following the standard protocol without IV contrast.  COMPARISON:  None.  FINDINGS: CT CHEST FINDINGS  Increased interstitial lung markings diffusely and bilaterally suggesting chronic lung disease. Mild right lower lobe atelectasis.  Negative for pneumonia. Small bilateral pleural effusions. Negative for mass. 15 mm precarinal node. 12 mm AP window node. Additional smaller mediastinal nodes. Sub carinal 15 mm node.  Cardiac enlargement without pericardial effusion. Coronary artery calcification. Extensive atherosclerotic calcification in the aortic arch and great vessels.  CT ABDOMEN AND PELVIS FINDINGS  Gallbladder is markedly distended with mild gallbladder wall thickening. Large 25 mm partially calcified gallstone. Biliary duct dilatation. Intrahepatic and extrahepatic bile ducts are dilated. Common bile duct measures 13 mm. Multiple rounded filling defects are present in the common bile duct consistent with common duct stones causing obstruction.  Pancreatic atrophy without mass lesion. No focal liver lesion. Spleen is normal.  4 cm left renal cyst.  No renal obstruction or stone.  Negative for bowel obstruction. Sigmoid diverticulosis. No mass in the abdomen. No  free fluid.  Lumbar scoliosis with multilevel spondylosis.  No fracture.  IMPRESSION: Increased lung markings diffusely suggesting chronic lung disease. Mediastinal adenopathy is noted without peripheral lung mass. Small bilateral pleural effusions.  Intrahepatic and extrahepatic biliary ductal dilatation with multiple common duct stones. Gallbladder is dilated with a 25 mm calcified stone and mild gallbladder wall thickening and hyper enhancement.   Electronically Signed   By: Franchot Gallo M.D.   On: 09/16/2014 07:37   Dg Chest Portable 1 View  09/16/2014   CLINICAL DATA:  Shortness of breath.  EXAM: PORTABLE CHEST - 1 VIEW  COMPARISON:  Chest radiograph performed 07/02/2012  FINDINGS: The lungs are well-aerated. Mild vascular congestion is noted, with minimal left basilar opacity, possibly reflecting minimal interstitial edema. There is no evidence of pleural effusion or pneumothorax.  The cardiomediastinal silhouette is mildly enlarged. The patient is status post median sternotomy. No acute osseous abnormalities are seen.  IMPRESSION: Mild vascular congestion and cardiomegaly, with minimal left basilar opacity, possibly reflecting minimal interstitial edema.   Electronically Signed   By: Garald Balding M.D.   On: 09/16/2014 03:00    EKG: Independently reviewed. A fib.   Assessment/Plan Active Problems:   S/P aortic valve replacement   Hypothyroidism   Atrial fibrillation   Congestive heart failure, unspecified   Cholangitis   Gallstone   Sepsis  1-Sepsis: patient presents with fever, leukocytosis, tachypnea. Source of infection probably cholangitis.  Admit to Step Down unit.  IV fluids, but monitor for pulmonary edema.  IV antibiotics, Zosyn.  Check Blood cultures.   2-Gallstone, ? Cholangitis, Surgery has already evaluated patient. Surgery recommend GI consult for ERCP.  -  GI consulted. IV antibiotic, IV protonix.   3-Left Heel wound: Wound care consulted.   4-CAD: patient with  dyspnea and chest pain. Chest pain more GI related. First troponin negative. Ct chest with lung diseases, small pleural effussion. Will Check ECHO. Will informed cardiologist of patient admission.    Code Status: Full Code.  DVT Prophylaxis:Lovenox.  Family Communication: Care discussed with son.  Disposition Plan: expect 4 to 5 days inpatient.   Time spent: 75 minutes.   Niel Hummer A [facility] Pager [phone]

## 2014-09-16 NOTE — Consult Note (Signed)
Huey Bienenstock 09-23-1916  [id number].   Primary Care MD: Dr. Eulah Citizen Requesting MD: Dr. Tomasita Crumble Chief Complaint/Reason for Consult: gallstones HPI: This is a 80 yo white female with multiple medical problems who began having epigastric abdominal "pressure" and back pain yesterday around 1700pm.  She developed some diarrhea at that time as well.  She is not a great historian.  She didn't think she ran fevers at home, but had one upon arrival to the ED.  She denies any nausea or vomiting, but her son states she has had a little nausea.  Upon arrival to the [facility], she was noted to have an elevated WBC, elevated LFTs, as well as a CT scan that shows intra and extra hepatic ductal dilatation with evidence of multiple CBD stones.  We have been asked to see the patient.  ROS: Please see HPI, otherwise, mostly negative.  She is finally getting over a left heal wound that she has had for 3 years.  She is weak and has round the clock caregivers who stay with her.  Family History  Problem Relation Age of Onset  . Stroke Mother   . Cancer Mother     LUNG  . Stroke Father   . Cancer Sister     BREAST,LUNG,LIVER  . Cancer Sister     LYMPHOMA    Past Medical History  Diagnosis Date  . Atrial flutter   . Hypothyroidism   . Hypertension   . Edema     Leg  . Atrial fibrillation     not on coumadin-managed on meciaitons.  . SOB (shortness of breath) 12/16/10    At rest-no oxygen at rest  . Chronic lung disease     Stable  . Cardiomegaly     Stable  . Disc disease, degenerative, lumbar or lumbosacral     Dr. Dyane Dustman Medicine-Last Steroids 5 yr ago  . Mixed hearing loss, bilateral   . Ingrowing nail 03/04/2013  . Congestive heart failure, unspecified   . Impacted cerumen 05/13/2012  . Other malaise and fatigue   . Pressure ulcer, heel(707.07)   . Hypopotassemia   . Unspecified constipation   . Hypoxemia   . Other abnormal blood chemistry   . Open fracture of unspecified  part of neck of femur   . Other chronic pain   . Atherosclerosis of native arteries of the extremities, unspecified   . Scoliosis (and kyphoscoliosis), idiopathic   . Nausea alone   . Urinary frequency   . Proteinuria   . Senile osteoporosis   . Unspecified vitamin D deficiency   . Herpes zoster with other nervous system complications(053.19)   . Depressive disorder, not elsewhere classified   . Cervicalgia   . Dysphagia, unspecified(787.20)   . Sciatica   . Pain in limb   . Anemia, unspecified   . Lumbago   . Allergic rhinitis due to pollen   . Abnormality of gait   . Neoplasm of uncertain behavior of skin   . Other atopic dermatitis and related conditions   . Esophageal reflux   . Mononeuritis of unspecified site   . Diaphragmatic hernia without mention of obstruction or gangrene   . Pain in joint, pelvic region and thigh   . Other and unspecified hyperlipidemia   . Encounter for long-term (current) use of other medications   . Osteoarthrosis, unspecified whether generalized or localized, lower leg   . Other left bundle branch block   . Other heart block   .  Dizziness and giddiness   . Other symptoms involving cardiovascular system   . Undiagnosed cardiac murmurs   . Insomnia, unspecified   . Neuralgia, postherpetic     Past Surgical History  Procedure Laterality Date  . Aortic valve replacement      2002 replaced with a pig vavle.    . Hip arthroplasty  01/28/2012    Procedure: ARTHROPLASTY BIPOLAR HIP;  Surgeon: Johnn Hai, MD;  Location: [facility];  Service: Orthopedics;  Laterality: Left;  . Partial hysterectomy  1961  . Appendectomy  1961  . Dilation and curettage of uterus  1959  . Resection right parietal meningioma  08/19/1990  . Cardiac catherization  10/1999    dr tysinger  . Aortic valve replacement with a porcine valve      DR HENDRICKSON  . Arthroscopy left knee      DR Eulas Post   . Cataract surgery right eye      DR EPPS  . Orif of fracture (l)  femur    . Colonoscopy  04/05/2004    Diverticulosis, Dr.Medoff  . Esophagogastroduodenoscopy  10/03/2008  . Myelogram  10/01/2008    Cervical     Social History:  reports that she has never smoked. She does not have any smokeless tobacco history on file. She reports that she does not drink alcohol or use illicit drugs.  Allergies:  Allergies  Allergen Reactions  . Accupril [Quinapril Hcl]   . Cephalexin     diarrhea  . Clarithromycin   . Codeine   . Demerol   . Fentanyl   . Lexapro [Escitalopram Oxalate]   . Monistat [Miconazole Nitrate-Wipes]   . Prednisone   . Tagamet [Cimetidine]      (Not in a hospital admission)  Blood pressure 136/61, pulse 84, temperature 101 F (38.3 C), temperature source Rectal, resp. rate 25, SpO2 91.00%. Physical Exam: General: pleasant, WD, WN white female who is laying in bed in NAD HEENT: head is normocephalic, atraumatic.  Sclera are noninjected.  PERRL.  Ears and nose without any masses or lesions.  Mouth is pink and moist Heart: rate controlled, but irregularly irregular.  Normal s1,s2. No obvious  gallops, or rubs noted. +murmur noted from artificial valve  Palpable radial and pedal pulses bilaterally Lungs: CTAB, no wheezes, rhonchi, or rales noted.  Respiratory effort nonlabored Abd: soft, tender in RUQ, but greatest in epigastrium, ND, +BS, no masses, hernias, or organomegaly MS: all 4 extremities are symmetrical with no cyanosis, clubbing.  She does have some bilateral lower extremity edema.  Resolving and healed left heal wound Skin: warm and dry with no masses, lesions, or rashes Psych: A&Ox3 with an appropriate affect.    Results for orders placed during the hospital encounter of 09/16/14 (from the past 48 hour(s))  CBC WITH DIFFERENTIAL     Status: Abnormal   Collection Time    09/16/14  2:20 AM      Result Value Ref Range   WBC 13.9 (*) 4.0 - 10.5 K/uL   RBC 3.93  3.87 - 5.11 MIL/uL   Hemoglobin 11.4 (*) 12.0 - 15.0 g/dL    HCT 34.8 (*) 36.0 - 46.0 %   MCV 88.5  78.0 - 100.0 fL   MCH 29.0  26.0 - 34.0 pg   MCHC 32.8  30.0 - 36.0 g/dL   RDW 14.1  11.5 - 15.5 %   Platelets 193  150 - 400 K/uL   Neutrophils Relative % 86 (*) 43 - 77 %  Neutro Abs 11.9 (*) 1.7 - 7.7 K/uL   Lymphocytes Relative 6 (*) 12 - 46 %   Lymphs Abs 0.9  0.7 - 4.0 K/uL   Monocytes Relative 7  3 - 12 %   Monocytes Absolute 1.0  0.1 - 1.0 K/uL   Eosinophils Relative 1  0 - 5 %   Eosinophils Absolute 0.1  0.0 - 0.7 K/uL   Basophils Relative 0  0 - 1 %   Basophils Absolute 0.0  0.0 - 0.1 K/uL  COMPREHENSIVE METABOLIC PANEL     Status: Abnormal   Collection Time    09/16/14  2:20 AM      Result Value Ref Range   Sodium 137  137 - 147 mEq/L   Potassium 3.8  3.7 - 5.3 mEq/L   Chloride 97  96 - 112 mEq/L   CO2 23  19 - 32 mEq/L   Glucose, Bld 173 (*) 70 - 99 mg/dL   BUN 16  6 - 23 mg/dL   Creatinine, Ser 0.76  0.50 - 1.10 mg/dL   Calcium 10.1  8.4 - 10.5 mg/dL   Total Protein 6.9  6.0 - 8.3 g/dL   Albumin 3.5  3.5 - 5.2 g/dL   AST 240 (*) 0 - 37 U/L   ALT 195 (*) 0 - 35 U/L   Alkaline Phosphatase 321 (*) 39 - 117 U/L   Total Bilirubin 2.3 (*) 0.3 - 1.2 mg/dL   GFR calc non Af Amer 69 (*) >90 mL/min   GFR calc Af Amer 80 (*) >90 mL/min   Comment: (NOTE)     The eGFR has been calculated using the CKD EPI equation.     This calculation has not been validated in all clinical situations.     eGFR's persistently <90 mL/min signify possible Chronic Kidney     Disease.   Anion gap 17 (*) 5 - 15  I-STAT CG4 LACTIC ACID, ED     Status: None   Collection Time    09/16/14  2:29 AM      Result Value Ref Range   Lactic Acid, Venous 1.82  0.5 - 2.2 mmol/L  I-STAT TROPOININ, ED     Status: None   Collection Time    09/16/14  5:07 AM      Result Value Ref Range   Troponin i, poc 0.05  0.00 - 0.08 ng/mL   Comment 3            Comment: Due to the release kinetics of cTnI,     a negative result within the first hours     of the onset of  symptoms does not rule out     myocardial infarction with certainty.     If myocardial infarction is still suspected,     repeat the test at appropriate intervals.  URINALYSIS, ROUTINE W REFLEX MICROSCOPIC     Status: Abnormal   Collection Time    09/16/14  7:32 AM      Result Value Ref Range   Color, Urine AMBER (*) YELLOW   Comment: BIOCHEMICALS MAY BE AFFECTED BY COLOR     LESS THAN 10 mL OF URINE SUBMITTED   APPearance CLEAR  CLEAR   Specific Gravity, Urine 1.036 (*) 1.005 - 1.030   pH 6.0  5.0 - 8.0   Glucose, UA NEGATIVE  NEGATIVE mg/dL   Hgb urine dipstick NEGATIVE  NEGATIVE   Bilirubin Urine MODERATE (*) NEGATIVE   Ketones, ur NEGATIVE  NEGATIVE mg/dL   Protein, ur 100 (*) NEGATIVE mg/dL   Urobilinogen, UA 1.0  0.0 - 1.0 mg/dL   Nitrite NEGATIVE  NEGATIVE   Leukocytes, UA NEGATIVE  NEGATIVE  URINE MICROSCOPIC-ADD ON     Status: Abnormal   Collection Time    09/16/14  7:32 AM      Result Value Ref Range   Squamous Epithelial / LPF RARE  RARE   WBC, UA 3-6  <3 WBC/hpf   RBC / HPF 0-2  <3 RBC/hpf   Bacteria, UA FEW (*) RARE   Ct Chest W Contrast  09/16/2014   CLINICAL DATA:  Right upper quadrant pain, fever  EXAM: CT CHEST, ABDOMEN AND PELVIS WITHOUT CONTRAST  TECHNIQUE: Multidetector CT imaging of the chest, abdomen and pelvis was performed following the standard protocol without IV contrast.  COMPARISON:  None.  FINDINGS: CT CHEST FINDINGS  Increased interstitial lung markings diffusely and bilaterally suggesting chronic lung disease. Mild right lower lobe atelectasis.  Negative for pneumonia. Small bilateral pleural effusions. Negative for mass. 15 mm precarinal node. 12 mm AP window node. Additional smaller mediastinal nodes. Sub carinal 15 mm node.  Cardiac enlargement without pericardial effusion. Coronary artery calcification. Extensive atherosclerotic calcification in the aortic arch and great vessels.  CT ABDOMEN AND PELVIS FINDINGS  Gallbladder is markedly distended  with mild gallbladder wall thickening. Large 25 mm partially calcified gallstone. Biliary duct dilatation. Intrahepatic and extrahepatic bile ducts are dilated. Common bile duct measures 13 mm. Multiple rounded filling defects are present in the common bile duct consistent with common duct stones causing obstruction.  Pancreatic atrophy without mass lesion. No focal liver lesion. Spleen is normal.  4 cm left renal cyst.  No renal obstruction or stone.  Negative for bowel obstruction. Sigmoid diverticulosis. No mass in the abdomen. No free fluid.  Lumbar scoliosis with multilevel spondylosis.  No fracture.  IMPRESSION: Increased lung markings diffusely suggesting chronic lung disease. Mediastinal adenopathy is noted without peripheral lung mass. Small bilateral pleural effusions.  Intrahepatic and extrahepatic biliary ductal dilatation with multiple common duct stones. Gallbladder is dilated with a 25 mm calcified stone and mild gallbladder wall thickening and hyper enhancement.   Electronically Signed   By: Franchot Gallo M.D.   On: 09/16/2014 07:37   Ct Abdomen Pelvis W Contrast  09/16/2014   CLINICAL DATA:  Right upper quadrant pain, fever  EXAM: CT CHEST, ABDOMEN AND PELVIS WITHOUT CONTRAST  TECHNIQUE: Multidetector CT imaging of the chest, abdomen and pelvis was performed following the standard protocol without IV contrast.  COMPARISON:  None.  FINDINGS: CT CHEST FINDINGS  Increased interstitial lung markings diffusely and bilaterally suggesting chronic lung disease. Mild right lower lobe atelectasis.  Negative for pneumonia. Small bilateral pleural effusions. Negative for mass. 15 mm precarinal node. 12 mm AP window node. Additional smaller mediastinal nodes. Sub carinal 15 mm node.  Cardiac enlargement without pericardial effusion. Coronary artery calcification. Extensive atherosclerotic calcification in the aortic arch and great vessels.  CT ABDOMEN AND PELVIS FINDINGS  Gallbladder is markedly distended  with mild gallbladder wall thickening. Large 25 mm partially calcified gallstone. Biliary duct dilatation. Intrahepatic and extrahepatic bile ducts are dilated. Common bile duct measures 13 mm. Multiple rounded filling defects are present in the common bile duct consistent with common duct stones causing obstruction.  Pancreatic atrophy without mass lesion. No focal liver lesion. Spleen is normal.  4 cm left renal cyst.  No renal obstruction or stone.  Negative for bowel obstruction. Sigmoid  diverticulosis. No mass in the abdomen. No free fluid.  Lumbar scoliosis with multilevel spondylosis.  No fracture.  IMPRESSION: Increased lung markings diffusely suggesting chronic lung disease. Mediastinal adenopathy is noted without peripheral lung mass. Small bilateral pleural effusions.  Intrahepatic and extrahepatic biliary ductal dilatation with multiple common duct stones. Gallbladder is dilated with a 25 mm calcified stone and mild gallbladder wall thickening and hyper enhancement.   Electronically Signed   By: Franchot Gallo M.D.   On: 09/16/2014 07:37   Dg Chest Portable 1 View  09/16/2014   CLINICAL DATA:  Shortness of breath.  EXAM: PORTABLE CHEST - 1 VIEW  COMPARISON:  Chest radiograph performed 07/02/2012  FINDINGS: The lungs are well-aerated. Mild vascular congestion is noted, with minimal left basilar opacity, possibly reflecting minimal interstitial edema. There is no evidence of pleural effusion or pneumothorax.  The cardiomediastinal silhouette is mildly enlarged. The patient is status post median sternotomy. No acute osseous abnormalities are seen.  IMPRESSION: Mild vascular congestion and cardiomegaly, with minimal left basilar opacity, possibly reflecting minimal interstitial edema.   Electronically Signed   By: Garald Balding M.D.   On: 09/16/2014 03:00       Assessment/Plan 1. Choledocholithiasis, likely early cholangitis given fever, elevated LFTs, elevated WBC 2. Multiple medical  problems  Plan: 1. I have spoken to the medical team who is admitting her.  She needs a GI consult for evaluation for ERCP given above findings.  I discussed the possibility of surgery with her which she is very hesitant about, understandably at her age. 2. We would recommend abx therapy.  We will follow along as hospital course plays out.  Lavada Langsam E 09/16/2014, 8:35 AM Pager: [phone]

## 2014-09-16 NOTE — Consult Note (Addendum)
WOC wound consult note Reason for Consult: Consult requested for left heel.  Pt has been followed as an outpatient for wound care.  Family members state previous wound at this site had greatly improved and was healed until this week, when it became red, swollen and developed a blister. She went to her physician who told her to leave it open to air and float to reduce pressure. He also ordered antibiotics, according to daughter at bedside. Wound type:  Stage 2 wound; previous blister which has ruptured and drained at some point and is now pink dry well-approximated intact skin. Pressure Ulcer POA: Yes Measurement: 8X8 cm Wound bed: Pink and dry Drainage (amount, consistency, odor) No open wound or drainage Periwound: generalized erythemia Dressing procedure/placement/frequency: Continue to float to reduce pressure and leave open to air.  Daughter denies further questions at this time regarding topical treatment and verbalizes understanding. Please re-consult if further assistance is needed.  Thank-you,  Julien Girt MSN, [place], [place], [place], [place]

## 2014-09-16 NOTE — ED Notes (Signed)
Patient woke from sleep with SOB and abdominal pain. Family also reports that patient has been having generalized weakness. Patient was seen at her PCP for an infection of her left heel. EMS placed patient on 2L O2 upon arrival and patient reports she feels better. Patient reports she has been constipated x3 days but has had 3 episodes of bowel incontinence with EMS. Patient had a LBBB on EMS 12 lead. Family states "She has some stomach issue where she is not supposed to have greasy foods." Patient ate bacon for dinner. Hx of valve replacement. Denies n/v, denies CP.

## 2014-09-16 NOTE — ED Notes (Signed)
Patient had 3 episodes of bowel incontinence while RN was cleaning patient.

## 2014-09-16 NOTE — Progress Notes (Signed)
  Echocardiogram 2D Echocardiogram has been performed.  Darlina Sicilian M 09/16/2014, 2:00 PM

## 2014-09-16 NOTE — Progress Notes (Signed)
Utilization review completed.  

## 2014-09-16 NOTE — Consult Note (Signed)
Agree with above, not really interested in surgery and told her we would reevaluate pending gi consult repeat labs.  Discussed with she and her son

## 2014-09-16 NOTE — ED Notes (Signed)
Attempted report x1. 

## 2014-09-16 NOTE — ED Provider Notes (Signed)
CSN: [id number]     Arrival date & time 09/16/14  0138 History   First MD Initiated Contact with Patient 09/16/14 0210     Chief Complaint  Patient presents with  . Shortness of Breath  . Abdominal Pain  . Fatigue     (Consider location/radiation/quality/duration/timing/severity/associated sxs/prior Treatment) HPI Comfort A Eppes is a 80 y.o. female with past history of hypertension and age fibrillation COPD coming in with fever. She was seen by her primary care provider and was placed on antibiotics for a cellulitis of her left foot. Patient states she has been short of breath for the past couple days, admits to a cough is nonproductive as well. She admits to being weak as well during the interval. She's been diaphoretic with or fevers.  Per, EMS she has complained of abdominal pain but did not admit this to me.  She also had bacon tonight for dinner an has been told not to eat greasy foods.  10 Systems reviewed and are negative for acute change except as noted in the HPI.      Past Medical History  Diagnosis Date  . Atrial flutter   . Hypothyroidism   . Hypertension   . Edema     Leg  . Atrial fibrillation     not on coumadin-managed on meciaitons.  . SOB (shortness of breath) 12/16/10    At rest-no oxygen at rest  . Chronic lung disease     Stable  . Cardiomegaly     Stable  . Disc disease, degenerative, lumbar or lumbosacral     Dr. Marcha D[facility]-Last Steroids 5 yr ago  . Mixed hearing loss, bilateral   . Ingrowing nail 03/04/2013  . Congestive heart failure, unspecified   . Impacted cerumen 05/13/2012  . Other malaise and fatigue   . Pressure ulcer, heel(707.07)   . Hypopotassemia   . Unspecified constipation   . Hypoxemia   . Other abnormal blood chemistry   . Open fracture of unspecified part of neck of femur   . Other chronic pain   . Atherosclerosis of native arteries of the extremities, unspecified   . Scoliosis (and kyphoscoliosis), idiopathic   .  Nausea alone   . Urinary frequency   . Proteinuria   . Senile osteoporosis   . Unspecified vitamin D deficiency   . Herpes zoster with other nervous system complications(053.19)   . Depressive disorder, not elsewhere classified   . Cervicalgia   . Dysphagia, unspecified(787.20)   . Sciatica   . Pain in limb   . Anemia, unspecified   . Lumbago   . Allergic rhinitis due to pollen   . Abnormality of gait   . Neoplasm of uncertain behavior of skin   . Other atopic dermatitis and related conditions   . Esophageal reflux   . Mononeuritis of unspecified site   . Diaphragmatic hernia without mention of obstruction or gangrene   . Pain in joint, pelvic region and thigh   . Other and unspecified hyperlipidemia   . Encounter for long-term (current) use of other medications   . Osteoarthrosis, unspecified whether generalized or localized, lower leg   . Other left bundle branch block   . Other heart block   . Dizziness and giddiness   . Other symptoms involving cardiovascular system   . Undiagnosed cardiac murmurs   . Insomnia, unspecified   . Neuralgia, postherpetic    Past Surgical History  Procedure Laterality Date  . Aortic valve replacement  2002 replaced with a pig vavle.    . Tumour      had surgery ion the 70's for it   . Hip arthroplasty  01/28/2012    Procedure: ARTHROPLASTY BIPOLAR HIP;  Surgeon: Johnn Hai, MD;  Location: [facility];  Service: Orthopedics;  Laterality: Left;  . Partial hysterectomy  1961  . Appendectomy  1961  . Dilation and curettage of uterus  1959  . Supracervical hysterectomy for endometriosis  1960  . Resection right parietal meningioma  08/19/1990  . Cardiac catherization  10/1999    dr tysinger  . Aortic valve replacement with a porcine valve      DR HENDRICKSON  . Arthroscopy left knee      DR Eulas Post   . Cataract surgery right eye      DR EPPS  . Orif of fracture (l) femur    . Colonoscopy  04/05/2004    Diverticulosis, Dr.Medoff  .  Esophagogastroduodenoscopy  10/03/2008  . Myelogram  10/01/2008    Cervical    Family History  Problem Relation Age of Onset  . Stroke Mother   . Cancer Mother     LUNG  . Stroke Father   . Cancer Sister     BREAST,LUNG,LIVER  . Cancer Sister     LYMPHOMA   History  Substance Use Topics  . Smoking status: Never Smoker   . Smokeless tobacco: Not on file  . Alcohol Use: No   OB History   Grav Para Term Preterm Abortions TAB SAB Ect Mult Living                 Review of Systems    Allergies  Accupril; Cephalexin; Clarithromycin; Codeine; Demerol; Fentanyl; Lexapro; Monistat; Prednisone; and Tagamet  Home Medications   Prior to Admission medications   Medication Sig Start Date End Date Taking? Authorizing Provider  Ascorbic Acid (VITAMIN C) 1000 MG tablet Take 1,000 mg by mouth daily.    Historical Provider, MD  aspirin EC 81 MG tablet Take 81 mg by mouth daily.    Historical Provider, MD  DULoxetine (CYMBALTA) 30 MG capsule TAKE ONE CAPSULE BY MOUTH EVERY DAY 07/30/14   Estill Dooms, MD  ferrous sulfate 325 (65 FE) MG tablet Take 1 tablet (325 mg total) by mouth daily with breakfast. 01/30/12 01/29/13  Charlynne Cousins, MD  ibuprofen (ADVIL) 200 MG tablet Take 200 mg by mouth every 6 (six) hours as needed for pain.    Historical Provider, MD  KLOR-CON 10 10 MEQ tablet 1 BY MOUTH DAILY 03/04/14   Estill Dooms, MD  KLOR-CON 10 10 MEQ tablet TAKE 1 TABLET BY MOUTH EVERY DAY    Estill Dooms, MD  levothyroxine (SYNTHROID, LEVOTHROID) 75 MCG tablet TAKE 1 TABLET BY MOUTH EVERY DAY FOR THYROID 08/02/14   Estill Dooms, MD  losartan-hydrochlorothiazide (HYZAAR) 100-25 MG per tablet TAKE 1 TABLET BY MOUTH ONCE DAILY TO CONTROL BLOOD PRESSURE 09/08/14   Estill Dooms, MD  metoprolol succinate (TOPROL-XL) 50 MG 24 hr tablet TAKE 1 TABLET BY MOUTH EVERY DAY    Estill Dooms, MD  Multiple Vitamin (MULITIVITAMIN WITH MINERALS) TABS Take 1 tablet by mouth daily.    Historical  Provider, MD  omeprazole (PRILOSEC) 20 MG capsule TAKE ONE CAPSULE BY MOUTH TWICE A DAY 05/26/14   Estill Dooms, MD  oxyCODONE (OXY IR/ROXICODONE) 5 MG immediate release tablet Take 5 mg by mouth every 6 (six) hours. 06/29/13  Historical Provider, MD  senna (SENOKOT) 8.6 MG tablet Take 1 tablet by mouth.    Historical Provider, MD  sennosides-docusate sodium (SENOKOT-S) 8.6-50 MG tablet Take 1 tablet by mouth as needed for constipation.     Historical Provider, MD  TDaP Durwin Reges) 5-2.5-18.5 LF-MCG/0.5 injection Inject 0.5 mLs into the muscle once. 11/12/13   Estill Dooms, MD  trolamine salicylate (ASPERCREME/ALOE) 10 % cream Apply 1 application topically as needed for muscle pain. Apply to painful elbow 4 times daily 11/11/13   Estill Dooms, MD   BP 143/72  Pulse 99  Temp(Src) 101 F (38.3 C) (Rectal)  Resp 25  SpO2 91% Physical Exam  Nursing note and vitals reviewed. Constitutional: She is oriented to person, place, and time. She appears well-developed and well-nourished. No distress.  HENT:  Head: Normocephalic and atraumatic.  Nose: Nose normal.  Mouth/Throat: Oropharynx is clear and moist. No oropharyngeal exudate.  Eyes: Conjunctivae and EOM are normal. Pupils are equal, round, and reactive to light. No scleral icterus.  Neck: Normal range of motion. Neck supple. No JVD present. No tracheal deviation present. No thyromegaly present.  Cardiovascular: Regular rhythm and normal heart sounds.  Exam reveals no gallop and no friction rub.   No murmur heard. Tachycardia  Pulmonary/Chest: Effort normal. No respiratory distress. She has wheezes. She exhibits no tenderness.  Tachypnea  Abdominal: Soft. Bowel sounds are normal. She exhibits no distension and no mass. There is tenderness. There is no rebound and no guarding.  Right upper quadrant tenderness to palpation.  Musculoskeletal: Normal range of motion. She exhibits no edema and no tenderness.  Lymphadenopathy:    She has no  cervical adenopathy.  Neurological: She is alert and oriented to person, place, and time.  Skin: Skin is warm and dry. No rash noted. She is not diaphoretic. No erythema. No pallor.    ED Course  Procedures (including critical care time) Labs Review Labs Reviewed  CBC WITH DIFFERENTIAL - Abnormal; Notable for the following:    WBC 13.9 (*)    Hemoglobin 11.4 (*)    HCT 34.8 (*)    Neutrophils Relative % 86 (*)    Neutro Abs 11.9 (*)    Lymphocytes Relative 6 (*)    All other components within normal limits  COMPREHENSIVE METABOLIC PANEL - Abnormal; Notable for the following:    Glucose, Bld 173 (*)    AST 240 (*)    ALT 195 (*)    Alkaline Phosphatase 321 (*)    Total Bilirubin 2.3 (*)    GFR calc non Af Amer 69 (*)    GFR calc Af Amer 80 (*)    Anion gap 17 (*)    All other components within normal limits  URINE CULTURE  URINALYSIS, ROUTINE W REFLEX MICROSCOPIC  I-STAT TROPOININ, ED  I-STAT CG4 LACTIC ACID, ED  I-STAT TROPOININ, ED    Imaging Review Ct Chest W Contrast  09/16/2014   CLINICAL DATA:  Right upper quadrant pain, fever  EXAM: CT CHEST, ABDOMEN AND PELVIS WITHOUT CONTRAST  TECHNIQUE: Multidetector CT imaging of the chest, abdomen and pelvis was performed following the standard protocol without IV contrast.  COMPARISON:  None.  FINDINGS: CT CHEST FINDINGS  Increased interstitial lung markings diffusely and bilaterally suggesting chronic lung disease. Mild right lower lobe atelectasis.  Negative for pneumonia. Small bilateral pleural effusions. Negative for mass. 15 mm precarinal node. 12 mm AP window node. Additional smaller mediastinal nodes. Sub carinal 15 mm node.  Cardiac  enlargement without pericardial effusion. Coronary artery calcification. Extensive atherosclerotic calcification in the aortic arch and great vessels.  CT ABDOMEN AND PELVIS FINDINGS  Gallbladder is markedly distended with mild gallbladder wall thickening. Large 25 mm partially calcified gallstone.  Biliary duct dilatation. Intrahepatic and extrahepatic bile ducts are dilated. Common bile duct measures 13 mm. Multiple rounded filling defects are present in the common bile duct consistent with common duct stones causing obstruction.  Pancreatic atrophy without mass lesion. No focal liver lesion. Spleen is normal.  4 cm left renal cyst.  No renal obstruction or stone.  Negative for bowel obstruction. Sigmoid diverticulosis. No mass in the abdomen. No free fluid.  Lumbar scoliosis with multilevel spondylosis.  No fracture.  IMPRESSION: Increased lung markings diffusely suggesting chronic lung disease. Mediastinal adenopathy is noted without peripheral lung mass. Small bilateral pleural effusions.  Intrahepatic and extrahepatic biliary ductal dilatation with multiple common duct stones. Gallbladder is dilated with a 25 mm calcified stone and mild gallbladder wall thickening and hyper enhancement.   Electronically Signed   By: Franchot Gallo M.D.   On: 09/16/2014 07:37   Ct Abdomen Pelvis W Contrast  09/16/2014   CLINICAL DATA:  Right upper quadrant pain, fever  EXAM: CT CHEST, ABDOMEN AND PELVIS WITHOUT CONTRAST  TECHNIQUE: Multidetector CT imaging of the chest, abdomen and pelvis was performed following the standard protocol without IV contrast.  COMPARISON:  None.  FINDINGS: CT CHEST FINDINGS  Increased interstitial lung markings diffusely and bilaterally suggesting chronic lung disease. Mild right lower lobe atelectasis.  Negative for pneumonia. Small bilateral pleural effusions. Negative for mass. 15 mm precarinal node. 12 mm AP window node. Additional smaller mediastinal nodes. Sub carinal 15 mm node.  Cardiac enlargement without pericardial effusion. Coronary artery calcification. Extensive atherosclerotic calcification in the aortic arch and great vessels.  CT ABDOMEN AND PELVIS FINDINGS  Gallbladder is markedly distended with mild gallbladder wall thickening. Large 25 mm partially calcified gallstone.  Biliary duct dilatation. Intrahepatic and extrahepatic bile ducts are dilated. Common bile duct measures 13 mm. Multiple rounded filling defects are present in the common bile duct consistent with common duct stones causing obstruction.  Pancreatic atrophy without mass lesion. No focal liver lesion. Spleen is normal.  4 cm left renal cyst.  No renal obstruction or stone.  Negative for bowel obstruction. Sigmoid diverticulosis. No mass in the abdomen. No free fluid.  Lumbar scoliosis with multilevel spondylosis.  No fracture.  IMPRESSION: Increased lung markings diffusely suggesting chronic lung disease. Mediastinal adenopathy is noted without peripheral lung mass. Small bilateral pleural effusions.  Intrahepatic and extrahepatic biliary ductal dilatation with multiple common duct stones. Gallbladder is dilated with a 25 mm calcified stone and mild gallbladder wall thickening and hyper enhancement.   Electronically Signed   By: Franchot Gallo M.D.   On: 09/16/2014 07:37   Dg Chest Portable 1 View  09/16/2014   CLINICAL DATA:  Shortness of breath.  EXAM: PORTABLE CHEST - 1 VIEW  COMPARISON:  Chest radiograph performed 07/02/2012  FINDINGS: The lungs are well-aerated. Mild vascular congestion is noted, with minimal left basilar opacity, possibly reflecting minimal interstitial edema. There is no evidence of pleural effusion or pneumothorax.  The cardiomediastinal silhouette is mildly enlarged. The patient is status post median sternotomy. No acute osseous abnormalities are seen.  IMPRESSION: Mild vascular congestion and cardiomegaly, with minimal left basilar opacity, possibly reflecting minimal interstitial edema.   Electronically Signed   By: Garald Balding M.D.   On: 09/16/2014 03:00  EKG Interpretation   Date/Time:  Thursday September 16 2014 01:47:35 EDT Ventricular Rate:  94 PR Interval:    QRS Duration: 165 QT Interval:  417 QTC Calculation: 521 R Axis:   -44 Text Interpretation:  Atrial  fibrillation Left bundle branch block No  significant change since last tracing Confirmed by OTTER  MD, OLGA ([id number])  on 09/16/2014 4:10:53 AM      MDM   Final diagnoses:  None    Patient does emergency department out of concern for fever and cough. Chest x-ray did not reveal an overwhelming pneumonia. Patient's abdominal exam was tender despite her not any abdominal pain for me. Will obtain CT scan and anticipate admission to the hospital. She was given her home oxycodone for pain relief of her chronic back pain.  CT scan reveals large gallbladder with choledocholithiasis.  She was given levoquin for treatment and surgery was consulted.  They rec medicine admission with GI consultation.      Everlene Balls, MD 09/16/14 1359

## 2014-09-16 NOTE — ED Notes (Signed)
Pt's caregiver at bedside. Informed them of plan that pt will be going to [facility] when she returns from [facility]

## 2014-09-16 NOTE — Consult Note (Signed)
Referring Provider: [facility] Primary Care Physician:  Estill Dooms, MD Primary Gastroenterologist: unassigned Contact: Etheleen Valtierra (son) [phone]  Reason for Consultation: Choledocholithiasis with cholangitis, elevated WBC and elevated liver enzymes.  HPI: Vicki Velasquez is a 80 y.o. female with a hx of afib not on anticoagulants, aortic stenosis s/p AVR,, HTN and COPD who was seen in ER with epigastric pain that started yesterday around 5:00 pm. Pt reported she had not had nausea and vomiting at home, but her son says she had been nauseous.Her pain radiated to the back/right shoulder blade area. Her son says for the past few months she has had intermittent episodes of post prandial nausea with no vomiting, but had never been evaluated for this complaint. She had an abd CT today that showed intra and extrahepatic ductal dilatation with evidence of multiple CBD stones. She also has an elevated WBC and elevated liver enzymes.      Pt not taking her po iron because it causes diarrhea. Does use ibuprofen 400 mg one to two times daily for foot pain for the past month. Pt is not on any PPI or H2B.  Pt had EGD 2009 for dysphagia that showed hiatal hernia and patent thin fibrous ring of esophagus. Did not have dilation because she was eating well at the time and no narrowing was seen. Currently she experiences intermittent dysphagia and had esophogram in 2009 that showed: 1. Patulous esophagus with potential stricture at the GE junction.  A 13 mm barium tablet failed to clear the GE junction. This may be  in part due to the position; however, have to consider stricturing  from reflux esophagitis.  2. Diverticulum in the region the GE junction.  3. Persistent mucosal thickening along the gastric fundus and  body. Differential includes gastritis, lymphoma, and gastric  carcinoma. Recommend endoscopy for further evaluation.    Past Medical History  Diagnosis Date  . Atrial  flutter   . Hypothyroidism   . Hypertension   . Edema     Leg  . Atrial fibrillation     not on coumadin-managed on meciaitons.  . SOB (shortness of breath) 12/16/10    At rest-no oxygen at rest  . Chronic lung disease     Stable  . Cardiomegaly     Stable  . Disc disease, degenerative, lumbar or lumbosacral     Dr. Marcha D[facility]-Last Steroids 5 yr ago  . Mixed hearing loss, bilateral   . Ingrowing nail 03/04/2013  . Congestive heart failure, unspecified   . Impacted cerumen 05/13/2012  . Other malaise and fatigue   . Pressure ulcer, heel(707.07)   . Hypopotassemia   . Unspecified constipation   . Hypoxemia   . Other abnormal blood chemistry   . Open fracture of unspecified part of neck of femur   . Other chronic pain   . Atherosclerosis of native arteries of the extremities, unspecified   . Scoliosis (and kyphoscoliosis), idiopathic   . Nausea alone   . Urinary frequency   . Proteinuria   . Senile osteoporosis   . Unspecified vitamin D deficiency   . Herpes zoster with other nervous system complications(053.19)   . Depressive disorder, not elsewhere classified   . Cervicalgia   . Dysphagia, unspecified(787.20)   . Sciatica   . Anemia, unspecified   . Lumbago   . Allergic rhinitis due to pollen   . Abnormality of gait   . Neoplasm of uncertain behavior of skin   . Esophageal  reflux   . Mononeuritis of unspecified site   . Diaphragmatic hernia without mention of obstruction or gangrene   . Pain in joint, pelvic region and thigh   . Other and unspecified hyperlipidemia   . Encounter for long-term (current) use of other medications   . Osteoarthrosis, unspecified whether generalized or localized, lower leg   . Other left bundle branch block   . Other heart block   . Dizziness and giddiness   . Undiagnosed cardiac murmurs   . Insomnia, unspecified   . Neuralgia, postherpetic     Past Surgical History  Procedure Laterality Date  . Aortic valve replacement       2002 replaced with a pig vavle.    . Hip arthroplasty  01/28/2012    Procedure: ARTHROPLASTY BIPOLAR HIP;  Surgeon: Johnn Hai, MD;  Location: [facility];  Service: Orthopedics;  Laterality: Left;  . Partial hysterectomy  1961  . Appendectomy  1961  . Dilation and curettage of uterus  1959  . Resection right parietal meningioma  08/19/1990  . Cardiac catherization  10/1999    dr tysinger  . Aortic valve replacement with a porcine valve      DR HENDRICKSON  . Arthroscopy left knee      DR Eulas Post   . Cataract surgery right eye      DR EPPS  . Orif of fracture (l) femur    . Colonoscopy  04/05/2004    Diverticulosis, Dr.Medoff  . Esophagogastroduodenoscopy  10/03/2008  . Myelogram  10/01/2008    Cervical     Prior to Admission medications   Medication Sig Start Date End Date Taking? Authorizing Provider  Ascorbic Acid (VITAMIN C) 1000 MG tablet Take 1,000 mg by mouth daily.   Yes Historical Provider, MD  aspirin EC 81 MG tablet Take 81 mg by mouth daily.   Yes Historical Provider, MD  DULoxetine (CYMBALTA) 30 MG capsule Take 30 mg by mouth daily.   Yes Historical Provider, MD  levothyroxine (SYNTHROID, LEVOTHROID) 75 MCG tablet Take 75 mcg by mouth daily before breakfast.   Yes Historical Provider, MD  losartan-hydrochlorothiazide (HYZAAR) 100-12.5 MG per tablet Take 1 tablet by mouth daily.   Yes Historical Provider, MD  metoprolol succinate (TOPROL-XL) 50 MG 24 hr tablet Take 50 mg by mouth daily. Take with or immediately following a meal.   Yes Historical Provider, MD  Multiple Vitamin (MULITIVITAMIN WITH MINERALS) TABS Take 1 tablet by mouth daily.   Yes Historical Provider, MD  omeprazole (PRILOSEC) 20 MG capsule Take 20 mg by mouth 2 (two) times daily before a meal.   Yes Historical Provider, MD  potassium chloride (KLOR-CON 10) 10 MEQ tablet Take 10 mEq by mouth daily.   Yes Historical Provider, MD  ferrous sulfate 325 (65 FE) MG tablet Take 1 tablet (325 mg total) by  mouth daily with breakfast. 01/30/12 01/29/13  Charlynne Cousins, MD  ibuprofen (ADVIL) 200 MG tablet Take 200 mg by mouth every 6 (six) hours as needed for pain.    Historical Provider, MD  KLOR-CON 10 10 MEQ tablet TAKE 1 TABLET BY MOUTH EVERY DAY    Estill Dooms, MD  oxyCODONE (OXY IR/ROXICODONE) 5 MG immediate release tablet Take 5 mg by mouth every 6 (six) hours. 06/29/13   Historical Provider, MD  senna (SENOKOT) 8.6 MG tablet Take 1 tablet by mouth.    Historical Provider, MD  sennosides-docusate sodium (SENOKOT-S) 8.6-50 MG tablet Take 1 tablet by mouth as needed  for constipation.     Historical Provider, MD  TDaP Durwin Reges) 5-2.5-18.5 LF-MCG/0.5 injection Inject 0.5 mLs into the muscle once. 11/12/13   Estill Dooms, MD  trolamine salicylate (ASPERCREME/ALOE) 10 % cream Apply 1 application topically as needed for muscle pain. Apply to painful elbow 4 times daily 11/11/13   Estill Dooms, MD    Current Facility-Administered Medications  Medication Dose Route Frequency Provider Last Rate Last Dose  . levothyroxine (SYNTHROID, LEVOTHROID) injection 25 mcg  25 mcg Intravenous Daily Belkys A Regalado, MD      . metoprolol (LOPRESSOR) injection 5 mg  5 mg Intravenous Q12H Belkys A Regalado, MD      . pantoprazole (PROTONIX) injection 40 mg  40 mg Intravenous Q12H Belkys A Regalado, MD   40 mg at 09/16/14 0907  . piperacillin-tazobactam (ZOSYN) IVPB 3.375 g  3.375 g Intravenous Q8H Belkys A Regalado, MD       Current Outpatient Prescriptions  Medication Sig Dispense Refill  . Ascorbic Acid (VITAMIN C) 1000 MG tablet Take 1,000 mg by mouth daily.      Marland Kitchen aspirin EC 81 MG tablet Take 81 mg by mouth daily.      . DULoxetine (CYMBALTA) 30 MG capsule Take 30 mg by mouth daily.      Marland Kitchen levothyroxine (SYNTHROID, LEVOTHROID) 75 MCG tablet Take 75 mcg by mouth daily before breakfast.      . losartan-hydrochlorothiazide (HYZAAR) 100-12.5 MG per tablet Take 1 tablet by mouth daily.      . metoprolol  succinate (TOPROL-XL) 50 MG 24 hr tablet Take 50 mg by mouth daily. Take with or immediately following a meal.      . Multiple Vitamin (MULITIVITAMIN WITH MINERALS) TABS Take 1 tablet by mouth daily.      Marland Kitchen omeprazole (PRILOSEC) 20 MG capsule Take 20 mg by mouth 2 (two) times daily before a meal.      . potassium chloride (KLOR-CON 10) 10 MEQ tablet Take 10 mEq by mouth daily.      .        . ibuprofen (ADVIL) 200 MG tablet Take 400 mg by mouth  every 6 (six) hours as needed for pain.      Marland Kitchen KLOR-CON 10 10 MEQ tablet TAKE 1 TABLET BY MOUTH EVERY DAY  30 tablet  5  . oxyCODONE (OXY IR/ROXICODONE) 5 MG immediate release tablet Take 5 mg by mouth every 6 (six) hours.      . senna (SENOKOT) 8.6 MG tablet Take 1 tablet by mouth.      . sennosides-docusate sodium (SENOKOT-S) 8.6-50 MG tablet Take 1 tablet by mouth as needed for constipation.       . TDaP (BOOSTRIX) 5-2.5-18.5 LF-MCG/0.5 injection Inject 0.5 mLs into the muscle once.  0.5 mL  0  . trolamine salicylate (ASPERCREME/ALOE) 10 % cream Apply 1 application topically as needed for muscle pain. Apply to painful elbow 4 times daily  85 g  0  . [DISCONTINUED] potassium chloride (K-DUR,KLOR-CON) 10 MEQ tablet 1 by mouth daily   30 tablet  3    Allergies as of 09/16/2014 - Review Complete 09/16/2014  Allergen Reaction Noted  . Accupril [quinapril hcl] Other (See Comments) 06/26/2013  . Cephalexin  07/07/2014  . Clarithromycin  09/22/2011  . Codeine  09/22/2011  . Demerol  09/22/2011  . Fentanyl  06/26/2013  . Lexapro [escitalopram oxalate]  06/26/2013  . Monistat [miconazole nitrate-wipes]  06/26/2013  . Prednisone  09/22/2011  . Tagamet [cimetidine]  06/26/2013    Family History  Problem Relation Age of Onset  . Stroke Mother   . Cancer Mother     LUNG  . Stroke Father   . Cancer Sister     BREAST,LUNG,LIVER  . Cancer Sister     LYMPHOMA    History   Social History  . Marital Status: Widowed    Spouse Name: N/A    Number  of Children: N/A  . Years of Education: N/A   Occupational History  . Not on file.   Social History Main Topics  . Smoking status: Never Smoker   . Smokeless tobacco: Not on file  . Alcohol Use: No  . Drug Use: No  . Sexual Activity: Not on file   Other Topics Concern  . 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       Doesn't drive by herself for the past 2-3 years.  Has an aide come in to help clean the house occasionally.     Can cook, and clean , but less than before   Has been using the walker for about 2 years.      Dr.Botero-Neurosurgeon   Dr.Sallie Miller-Optometry   Dr.Hatcher-Neurology   Dr.Cylde Nolan-Dermatology   Dr.Phillips Carter-Orthopedics   Dr.Tysinger-Cardiology   Dr.Hendrickson-CVTS   Dr.Epes-Ophthalmology   Dr.Medoff-GI   Dr.Olin/Ramos-Ortho   Dr.Phillips-Pain management    Review of Systems: Gen: Denies any fever, chills, sweats, anorexia, fatigue, weakness, malaise, weight loss, and sleep disorder CV: Denies , angina, palpitations, syncope, orthopnea, PND, peripheral edema, and claudication.Pt says she had chest pain when she came to ER and points to epigastric area. Resp: Denies dyspnea at rest, dyspnea with exercise, cough, sputum, wheezing, coughing up blood, and pleurisy. GI: Denies vomiting blood, jaundice, and fecal incontinence.   Denies dysphagia or odynophagia. GU : Denies urinary burning, blood in urine, urinary frequency, urinary hesitancy, nocturnal urination, and urinary incontinence. MS: Denies joint pain, limitation of movement, and swelling, stiffness, low back pain, extremity pain. Denies muscle weakness, cramps, atrophy.  Derm: Denies rash, itching, dry skin, hives, moles, warts, or unhealing ulcers.  Psych: Denies depression, anxiety, memory loss, suicidal ideation, hallucinations, paranoia, and confusion. Heme: Denies bruising,  bleeding, and enlarged lymph nodes. Neuro:  Denies any headaches, dizziness, paresthesias. Endo:  Denies any problems with DM, thyroid, adrenal function.  Physical Exam: Vital signs in last 24 hours: Temp:  [97.7 F (36.5 C)-101 F (38.3 C)] 101 F (38.3 C) (09/17 0256) Pulse Rate:  [73-100] 73 (09/17 1030) Resp:  [19-32] 19 (09/17 1030) BP: (118-177)/(48-164) 135/55 mmHg (09/17 1030) SpO2:  [91 %-97 %] 95 % (09/17 1030)   General:   Alert,  Well-developed, well-nourished, pleasant and cooperative in NAD Head:  Normocephalic and atraumatic. Eyes:  Sclera clear, no icterus.  Conjunctiva pink. Ears:  Normal auditory acuity. Nose:  No deformity, discharge,  or lesions. Mouth:  No deformity or lesions.   Neck:  Supple; no masses or thyromegaly. Lungs:  Clear throughout to auscultation.   No wheezes, crackles, or rhonchi.  Heart:  Regular rate and rhythm; no murmurs, clicks, rubs,  or gallops. Abdomen:  Soft,nontender, BS active,nonpalp mass or hsm.   Rectal:  Deferred  Msk:  Symmetrical without gross deformities. . Pulses:  Normal pulses noted. Extremities:  Without clubbing or edema.Left heel with erythema  ? Early pressure ulcer Neurologic:  Alert and oriented. Psych:  Alert and cooperative. Normal mood and affect.  Intake/Output from previous day:   Intake/Output this shift: Total I/O In: 1000 [I.V.:1000] Out: -   Lab Results:  Recent Labs  09/16/14 0220  WBC 13.9*  HGB 11.4*  HCT 34.8*  PLT 193   BMET  Recent Labs  09/16/14 0220  NA 137  K 3.8  CL 97  CO2 23  GLUCOSE 173*  BUN 16  CREATININE 0.76  CALCIUM 10.1   LFT  Recent Labs  09/16/14 0220  PROT 6.9  ALBUMIN 3.5  AST 240*  ALT 195*  ALKPHOS 321*  BILITOT 2.3*    Studies/Results:  CT images viewed Gatha Mayer, MD, [facility]  Ct Chest W Contrast  09/16/2014   CLINICAL DATA:  Right upper quadrant pain, fever  EXAM: CT CHEST, ABDOMEN AND PELVIS WITHOUT CONTRAST  TECHNIQUE: Multidetector  CT imaging of the chest, abdomen and pelvis was performed following the standard protocol without IV contrast.  COMPARISON:  None.  FINDINGS: CT CHEST FINDINGS  Increased interstitial lung markings diffusely and bilaterally suggesting chronic lung disease. Mild right lower lobe atelectasis.  Negative for pneumonia. Small bilateral pleural effusions. Negative for mass. 15 mm precarinal node. 12 mm AP window node. Additional smaller mediastinal nodes. Sub carinal 15 mm node.  Cardiac enlargement without pericardial effusion. Coronary artery calcification. Extensive atherosclerotic calcification in the aortic arch and great vessels.  CT ABDOMEN AND PELVIS FINDINGS  Gallbladder is markedly distended with mild gallbladder wall thickening. Large 25 mm partially calcified gallstone. Biliary duct dilatation. Intrahepatic and extrahepatic bile ducts are dilated. Common bile duct measures 13 mm. Multiple rounded filling defects are present in the common bile duct consistent with common duct stones causing obstruction.  Pancreatic atrophy without mass lesion. No focal liver lesion. Spleen is normal.  4 cm left renal cyst.  No renal obstruction or stone.  Negative for bowel obstruction. Sigmoid diverticulosis. No mass in the abdomen. No free fluid.  Lumbar scoliosis with multilevel spondylosis.  No fracture.  IMPRESSION: Increased lung markings diffusely suggesting chronic lung disease. Mediastinal adenopathy is noted without peripheral lung mass. Small bilateral pleural effusions.  Intrahepatic and extrahepatic biliary ductal dilatation with multiple common duct stones. Gallbladder is dilated with a 25 mm calcified stone and mild gallbladder wall thickening and hyper enhancement.   Electronically Signed   By: Franchot Gallo M.D.   On: 09/16/2014 07:37   Ct Abdomen Pelvis W Contrast  09/16/2014   CLINICAL DATA:  Right upper quadrant pain, fever  EXAM: CT CHEST, ABDOMEN AND PELVIS WITHOUT CONTRAST  TECHNIQUE: Multidetector CT  imaging of the chest, abdomen and pelvis was performed following the standard protocol without IV contrast.  COMPARISON:  None.  FINDINGS: CT CHEST FINDINGS  Increased interstitial lung markings diffusely and bilaterally suggesting chronic lung disease. Mild right lower lobe atelectasis.  Negative for pneumonia. Small bilateral pleural effusions. Negative for mass. 15 mm precarinal node. 12 mm AP window node. Additional smaller mediastinal nodes. Sub carinal 15 mm node.  Cardiac enlargement without pericardial effusion. Coronary artery calcification. Extensive atherosclerotic calcification in the aortic arch and great vessels.  CT ABDOMEN AND PELVIS FINDINGS  Gallbladder is markedly distended with mild gallbladder wall thickening. Large 25 mm partially calcified gallstone. Biliary duct dilatation. Intrahepatic and extrahepatic bile ducts are dilated. Common bile duct measures 13 mm. Multiple rounded filling defects are present in the common bile duct consistent with common duct stones causing obstruction.  Pancreatic atrophy without  mass lesion. No focal liver lesion. Spleen is normal.  4 cm left renal cyst.  No renal obstruction or stone.  Negative for bowel obstruction. Sigmoid diverticulosis. No mass in the abdomen. No free fluid.  Lumbar scoliosis with multilevel spondylosis.  No fracture.  IMPRESSION: Increased lung markings diffusely suggesting chronic lung disease. Mediastinal adenopathy is noted without peripheral lung mass. Small bilateral pleural effusions.  Intrahepatic and extrahepatic biliary ductal dilatation with multiple common duct stones. Gallbladder is dilated with a 25 mm calcified stone and mild gallbladder wall thickening and hyper enhancement.   Electronically Signed   By: Franchot Gallo M.D.   On: 09/16/2014 07:37   Dg Chest Portable 1 View  09/16/2014   CLINICAL DATA:  Shortness of breath.  EXAM: PORTABLE CHEST - 1 VIEW  COMPARISON:  Chest radiograph performed 07/02/2012  FINDINGS: The  lungs are well-aerated. Mild vascular congestion is noted, with minimal left basilar opacity, possibly reflecting minimal interstitial edema. There is no evidence of pleural effusion or pneumothorax.  The cardiomediastinal silhouette is mildly enlarged. The patient is status post median sternotomy. No acute osseous abnormalities are seen.  IMPRESSION: Mild vascular congestion and cardiomegaly, with minimal left basilar opacity, possibly reflecting minimal interstitial edema.   Electronically Signed   By: Garald Balding M.D.   On: 09/16/2014 03:00    PREVIOUS ENDOSCOPIC PROCEDURES: 10/03/08  EGD Dr Watt Climes 1. Questionable tiny hiatal hernia with a questionable, widely patent thin fibrous ring.  2. Some C-loop narrowing of duodenum.  3. Otherwise normal esophagogastroduodenoscopy without any stomach  abnormalities seen. Held dilation due to eating well this week and no significant narrowing seen. 4. Hx afib. Is not on anticoagulation.   IMPRESSION/PLAN: 1. Choledocholithiasis w/ cholangitis. Surgery has seen pt and has discussed surgery, which the pt is hesitant about given her age. Pt will likely need ERCP, and have reviewed this with pt and her son, Percell Miller. Orders placed in depot. Would continue antibiotics 2. Chest pain. This has gotten better since in ER. First  troponin neg. Chest CT with small bilat pleural effusions. Echo has been ordered. Dr Aundra Dubin (cardiology) consulting patient now. - Likely from CBD stone(s) 3. Left heel wound: Wound care consulted. 4. Hx dysphagia. Intermittent symptoms.Previous work up per HPI.  Hvozdovic, Deloris Ping 09/16/2014,  Pager [phone]  [facility] Attending  I have also seen and assessed the patient and agree with the above note. She is elderly but seems like a reasonable candidate to try ERCP, sphincterotomy and stone extraction. Son has heard about procedures from my PA and I will also discuss (message left for him). I have explained risks benefits and  indications to patient as well.  Her dysphagia may not be too symptomatic but could possibly need an esophageal dilation so will proceed with that in mind tomorrow.  Gatha Mayer, MD, [facility] [phone] (pager) 09/16/2014 4:24 PM

## 2014-09-17 ENCOUNTER — Encounter (HOSPITAL_COMMUNITY): Payer: Medicare Other | Admitting: Anesthesiology

## 2014-09-17 ENCOUNTER — Encounter (HOSPITAL_COMMUNITY)
Admission: EM | Disposition: A | Discharge status: Home Health | Payer: Self-pay | Source: Home / Self Care | Attending: Internal Medicine

## 2014-09-17 ENCOUNTER — Encounter (HOSPITAL_COMMUNITY): Payer: Self-pay | Admitting: Certified Registered Nurse Anesthetist

## 2014-09-17 ENCOUNTER — Inpatient Hospital Stay (HOSPITAL_COMMUNITY): Payer: Medicare Other

## 2014-09-17 ENCOUNTER — Inpatient Hospital Stay (HOSPITAL_COMMUNITY): Payer: Medicare Other | Admitting: Anesthesiology

## 2014-09-17 HISTORY — PX: ERCP: SHX5425

## 2014-09-17 LAB — COMPREHENSIVE METABOLIC PANEL
ALBUMIN: 3.1 g/dL — AB (ref 3.5–5.2)
ALT: 158 U/L — ABNORMAL HIGH (ref 0–35)
ANION GAP: 17 — AB (ref 5–15)
AST: 129 U/L — AB (ref 0–37)
Alkaline Phosphatase: 245 U/L — ABNORMAL HIGH (ref 39–117)
BUN: 16 mg/dL (ref 6–23)
CO2: 21 mEq/L (ref 19–32)
CREATININE: 0.8 mg/dL (ref 0.50–1.10)
Calcium: 9.2 mg/dL (ref 8.4–10.5)
Chloride: 98 mEq/L (ref 96–112)
GFR calc Af Amer: 70 mL/min — ABNORMAL LOW (ref >90)
GFR calc non Af Amer: 61 mL/min — ABNORMAL LOW (ref >90)
Glucose, Bld: 103 mg/dL — ABNORMAL HIGH (ref 70–99)
Potassium: 3.5 mEq/L — ABNORMAL LOW (ref 3.7–5.3)
Sodium: 136 mEq/L — ABNORMAL LOW (ref 137–147)
TOTAL PROTEIN: 5.9 g/dL — AB (ref 6.0–8.3)
Total Bilirubin: 4 mg/dL — ABNORMAL HIGH (ref 0.3–1.2)

## 2014-09-17 LAB — CBC
HEMATOCRIT: 32 % — AB (ref 36.0–46.0)
HEMOGLOBIN: 10.5 g/dL — AB (ref 12.0–15.0)
MCH: 29.6 pg (ref 26.0–34.0)
MCHC: 32.8 g/dL (ref 30.0–36.0)
MCV: 90.1 fL (ref 78.0–100.0)
Platelets: 142 10*3/uL — ABNORMAL LOW (ref 150–400)
RBC: 3.55 MIL/uL — ABNORMAL LOW (ref 3.87–5.11)
RDW: 14.7 % (ref 11.5–15.5)
WBC: 13 10*3/uL — AB (ref 4.0–10.5)

## 2014-09-17 LAB — URINE CULTURE
Colony Count: NO GROWTH
Culture: NO GROWTH

## 2014-09-17 SURGERY — ERCP, WITH INTERVENTION IF INDICATED
Anesthesia: General

## 2014-09-17 MED ORDER — OXYCODONE HCL 5 MG PO TABS
5.0000 mg | ORAL_TABLET | ORAL | Status: DC | PRN
Start: 2014-09-17 — End: 2014-09-21
  Administered 2014-09-17 – 2014-09-20 (×7): 5 mg via ORAL
  Filled 2014-09-17 (×10): qty 1

## 2014-09-17 MED ORDER — LACTATED RINGERS IV SOLN
INTRAVENOUS | Status: DC
  Administered 2014-09-17: 1000 mL via INTRAVENOUS

## 2014-09-17 MED ORDER — LIDOCAINE HCL (CARDIAC) 20 MG/ML IV SOLN
INTRAVENOUS | Status: DC | PRN
  Administered 2014-09-17: 100 mg via INTRAVENOUS

## 2014-09-17 MED ORDER — INDOMETHACIN 50 MG RE SUPP
100.0000 mg | Freq: Once | RECTAL | Status: AC
  Administered 2014-09-17: 100 mg via RECTAL
  Filled 2014-09-17: qty 2

## 2014-09-17 MED ORDER — PROPOFOL 10 MG/ML IV BOLUS
INTRAVENOUS | Status: DC | PRN
  Administered 2014-09-17: 10 mg via INTRAVENOUS
  Administered 2014-09-17: 50 mg via INTRAVENOUS
  Administered 2014-09-17 (×9): 10 mg via INTRAVENOUS

## 2014-09-17 MED ORDER — GLUCAGON HCL RDNA (DIAGNOSTIC) 1 MG IJ SOLR
INTRAMUSCULAR | Status: DC | PRN
  Administered 2014-09-17: .5 mg via INTRAVENOUS

## 2014-09-17 MED ORDER — PHENYLEPHRINE HCL 10 MG/ML IJ SOLN
INTRAMUSCULAR | Status: DC | PRN
  Administered 2014-09-17 (×3): 40 ug via INTRAVENOUS

## 2014-09-17 MED ORDER — SUCCINYLCHOLINE CHLORIDE 20 MG/ML IJ SOLN
INTRAMUSCULAR | Status: DC | PRN
  Administered 2014-09-17: 60 mg via INTRAVENOUS

## 2014-09-17 MED ORDER — FENTANYL CITRATE 0.05 MG/ML IJ SOLN
INTRAMUSCULAR | Status: DC | PRN
  Administered 2014-09-17: 25 ug via INTRAVENOUS

## 2014-09-17 MED ORDER — LACTATED RINGERS IV SOLN
INTRAVENOUS | Status: DC | PRN
  Administered 2014-09-17: 08:00:00 via INTRAVENOUS

## 2014-09-17 MED ORDER — MORPHINE SULFATE 2 MG/ML IJ SOLN: 1.0000 mg | INTRAMUSCULAR | Status: DC | PRN

## 2014-09-17 MED ORDER — SODIUM CHLORIDE 0.9 % IV SOLN
INTRAVENOUS | Status: DC | PRN
  Administered 2014-09-17: 10:00:00

## 2014-09-17 MED ORDER — LEVOTHYROXINE SODIUM 100 MCG IV SOLR
37.5000 ug | Freq: Every day | INTRAVENOUS | Status: DC
  Administered 2014-09-18: 37.5 ug via INTRAVENOUS
  Filled 2014-09-17: qty 5

## 2014-09-17 MED ORDER — LIDOCAINE HCL 4 % MT SOLN
OROMUCOSAL | Status: DC | PRN
  Administered 2014-09-17: 4 mL via TOPICAL

## 2014-09-17 MED ORDER — METOPROLOL SUCCINATE ER 50 MG PO TB24
50.0000 mg | ORAL_TABLET | Freq: Every day | ORAL | Status: DC
  Administered 2014-09-17 – 2014-09-21 (×5): 50 mg via ORAL
  Filled 2014-09-17 (×5): qty 1

## 2014-09-17 SURGICAL SUPPLY — 1 items: biliary stent 10f-5cm Advanix lot18220896 ×2 IMPLANT

## 2014-09-17 NOTE — Progress Notes (Signed)
[facility] Progress Note  Day of Surgery  Subjective: Pt still in pain.  Just got back from endo and still very sleepy.  No N/V.    Objective: Vital signs in last 24 hours: Temp:  [97.3 F (36.3 C)-98.9 F (37.2 C)] 98.1 F (36.7 C) (09/18 0956) Pulse Rate:  [71-84] 84 (09/18 1100) Resp:  [14-24] 18 (09/18 1100) BP: (118-191)/(45-93) 162/60 mmHg (09/18 1100) SpO2:  [92 %-100 %] 96 % (09/18 1100) Weight:  [136 lb 7.4 oz (61.9 kg)-142 lb 3.2 oz (64.5 kg)] 142 lb 3.2 oz (64.5 kg) (09/18 0500) Last BM Date: 09/16/14  Intake/Output from previous day: 09/17 0701 - 09/18 0700 In: 1200 [I.V.:1200] Out: -  Intake/Output this shift: Total I/O In: 500 [I.V.:500] Out: -   PE: Gen:  Alert, NAD, pleasant Abd: Soft, ND, tender in epigastrium/RUQ, +BS, no HSM, abdominal scars noted   Lab Results:   Recent Labs  09/16/14 0220 09/17/14 0256  WBC 13.9* 13.0*  HGB 11.4* 10.5*  HCT 34.8* 32.0*  PLT 193 142*   BMET  Recent Labs  09/16/14 0220 09/17/14 0256  NA 137 136*  K 3.8 3.5*  CL 97 98  CO2 23 21  GLUCOSE 173* 103*  BUN 16 16  CREATININE 0.76 0.80  CALCIUM 10.1 9.2   PT/INR No results found for this basename: LABPROT, INR,  in the last 72 hours CMP     Component Value Date/Time   NA 136* 09/17/2014 0256   NA 141 07/05/2014 0849   K 3.5* 09/17/2014 0256   CL 98 09/17/2014 0256   CO2 21 09/17/2014 0256   GLUCOSE 103* 09/17/2014 0256   GLUCOSE 87 07/05/2014 0849   BUN 16 09/17/2014 0256   BUN 22 07/05/2014 0849   CREATININE 0.80 09/17/2014 0256   CALCIUM 9.2 09/17/2014 0256   PROT 5.9* 09/17/2014 0256   PROT 6.3 07/05/2014 0849   ALBUMIN 3.1* 09/17/2014 0256   AST 129* 09/17/2014 0256   ALT 158* 09/17/2014 0256   ALKPHOS 245* 09/17/2014 0256   BILITOT 4.0* 09/17/2014 0256   GFRNONAA 61* 09/17/2014 0256   GFRAA 70* 09/17/2014 0256   Lipase     Component Value Date/Time   LIPASE 11 09/16/2014 0900       Studies/Results: Ct Chest W Contrast  09/16/2014    CLINICAL DATA:  Right upper quadrant pain, fever  EXAM: CT CHEST, ABDOMEN AND PELVIS WITHOUT CONTRAST  TECHNIQUE: Multidetector CT imaging of the chest, abdomen and pelvis was performed following the standard protocol without IV contrast.  COMPARISON:  None.  FINDINGS: CT CHEST FINDINGS  Increased interstitial lung markings diffusely and bilaterally suggesting chronic lung disease. Mild right lower lobe atelectasis.  Negative for pneumonia. Small bilateral pleural effusions. Negative for mass. 15 mm precarinal node. 12 mm AP window node. Additional smaller mediastinal nodes. Sub carinal 15 mm node.  Cardiac enlargement without pericardial effusion. Coronary artery calcification. Extensive atherosclerotic calcification in the aortic arch and great vessels.  CT ABDOMEN AND PELVIS FINDINGS  Gallbladder is markedly distended with mild gallbladder wall thickening. Large 25 mm partially calcified gallstone. Biliary duct dilatation. Intrahepatic and extrahepatic bile ducts are dilated. Common bile duct measures 13 mm. Multiple rounded filling defects are present in the common bile duct consistent with common duct stones causing obstruction.  Pancreatic atrophy without mass lesion. No focal liver lesion. Spleen is normal.  4 cm left renal cyst.  No renal obstruction or stone.  Negative for bowel obstruction. Sigmoid diverticulosis.  No mass in the abdomen. No free fluid.  Lumbar scoliosis with multilevel spondylosis.  No fracture.  IMPRESSION: Increased lung markings diffusely suggesting chronic lung disease. Mediastinal adenopathy is noted without peripheral lung mass. Small bilateral pleural effusions.  Intrahepatic and extrahepatic biliary ductal dilatation with multiple common duct stones. Gallbladder is dilated with a 25 mm calcified stone and mild gallbladder wall thickening and hyper enhancement.   Electronically Signed   By: Franchot Gallo M.D.   On: 09/16/2014 07:37   Ct Abdomen Pelvis W Contrast  09/16/2014    CLINICAL DATA:  Right upper quadrant pain, fever  EXAM: CT CHEST, ABDOMEN AND PELVIS WITHOUT CONTRAST  TECHNIQUE: Multidetector CT imaging of the chest, abdomen and pelvis was performed following the standard protocol without IV contrast.  COMPARISON:  None.  FINDINGS: CT CHEST FINDINGS  Increased interstitial lung markings diffusely and bilaterally suggesting chronic lung disease. Mild right lower lobe atelectasis.  Negative for pneumonia. Small bilateral pleural effusions. Negative for mass. 15 mm precarinal node. 12 mm AP window node. Additional smaller mediastinal nodes. Sub carinal 15 mm node.  Cardiac enlargement without pericardial effusion. Coronary artery calcification. Extensive atherosclerotic calcification in the aortic arch and great vessels.  CT ABDOMEN AND PELVIS FINDINGS  Gallbladder is markedly distended with mild gallbladder wall thickening. Large 25 mm partially calcified gallstone. Biliary duct dilatation. Intrahepatic and extrahepatic bile ducts are dilated. Common bile duct measures 13 mm. Multiple rounded filling defects are present in the common bile duct consistent with common duct stones causing obstruction.  Pancreatic atrophy without mass lesion. No focal liver lesion. Spleen is normal.  4 cm left renal cyst.  No renal obstruction or stone.  Negative for bowel obstruction. Sigmoid diverticulosis. No mass in the abdomen. No free fluid.  Lumbar scoliosis with multilevel spondylosis.  No fracture.  IMPRESSION: Increased lung markings diffusely suggesting chronic lung disease. Mediastinal adenopathy is noted without peripheral lung mass. Small bilateral pleural effusions.  Intrahepatic and extrahepatic biliary ductal dilatation with multiple common duct stones. Gallbladder is dilated with a 25 mm calcified stone and mild gallbladder wall thickening and hyper enhancement.   Electronically Signed   By: Franchot Gallo M.D.   On: 09/16/2014 07:37   Dg Chest Portable 1 View  09/16/2014    CLINICAL DATA:  Shortness of breath.  EXAM: PORTABLE CHEST - 1 VIEW  COMPARISON:  Chest radiograph performed 07/02/2012  FINDINGS: The lungs are well-aerated. Mild vascular congestion is noted, with minimal left basilar opacity, possibly reflecting minimal interstitial edema. There is no evidence of pleural effusion or pneumothorax.  The cardiomediastinal silhouette is mildly enlarged. The patient is status post median sternotomy. No acute osseous abnormalities are seen.  IMPRESSION: Mild vascular congestion and cardiomegaly, with minimal left basilar opacity, possibly reflecting minimal interstitial edema.   Electronically Signed   By: Garald Balding M.D.   On: 09/16/2014 03:00   Dg Ercp With Sphincterotomy  09/17/2014   CLINICAL DATA:  Multiple bile duct stones.  EXAM: ERCP  COMPARISON:  CT the abdomen pelvis - 09/16/2014  FINDINGS: Four spot intraoperative fluoroscopic images of the right upper abdominal quadrant during ERCP are provided for review.  Initial image demonstrates an ERCP probe overlying the right upper abdominal quadrant.  Subsequent images demonstrate selective cannulation and opacification of the CBD which appears dilated.  There are several persistent filling defects within the central and distal aspects of the CBD compatible with the choledocholithiasis demonstrated on prior abdominal CT. Additionally, there is minimal opacification of  the gallbladder with a large intraluminal filling defect compatible with the known large gallstone.  Subsequent images demonstrate insufflation of a sphincterotomy balloon within the distal aspect of the CBD.  Completion image demonstrates a biliary stent overlying distal aspect of the CBD. Several nonocclusive residual filling defects are seen within the central aspect of the proper hepatic duct.  There is minimal opacification of the central aspect of the intrahepatic biliary tree which appears mildly dilated.  IMPRESSION: ERCP with stone retraction and stent  placement with residual choledocholithiasis and cholelithiasis as detailed above.   Electronically Signed   By: Sandi Mariscal M.D.   On: 09/17/2014 11:06    Anti-infectives: Anti-infectives   Start     Dose/Rate Route Frequency Ordered Stop   09/16/14 1730  piperacillin-tazobactam (ZOSYN) IVPB 3.375 g     3.375 g 12.5 mL/hr over 240 Minutes Intravenous Every 8 hours 09/16/14 0918     09/16/14 0915  piperacillin-tazobactam (ZOSYN) IVPB 3.375 g     3.375 g 100 mL/hr over 30 Minutes Intravenous  Once 09/16/14 0912 09/16/14 1036   09/16/14 0745  levofloxacin (LEVAQUIN) IVPB 750 mg     750 mg 100 mL/hr over 90 Minutes Intravenous  Once 09/16/14 0741 09/16/14 0947       Assessment/Plan 1. Choledocholithiasis, cholangitis, transaminitis, hyperbilirubinemia, leukocytosis 2. Multiple medical problems  Plan: 1.  S/p ERCP with stent placement.  GI could not remove any stones.  Will monitor labs - ordered for am. 2.  On clears now, IVF, pain control, antiemetics 3.  Surgery is refused by the family at this point in time, not much else to offer her at this time.  Hopefully the stent will help.    LOS: 1 day    Coralie Keens 09/17/2014, 11:37 AM Pager: [phone]

## 2014-09-17 NOTE — Transfer of Care (Signed)
Immediate Anesthesia Transfer of Care Note  Patient: Vicki Velasquez  Procedure(s) Performed: Procedure(s): ENDOSCOPIC RETROGRADE CHOLANGIOPANCREATOGRAPHY (ERCP) (N/A)  Patient Location: PACU and Endoscopy Unit  Anesthesia Type:General  Level of Consciousness: responds to stimulation  Airway & Oxygen Therapy: Patient Spontanous Breathing and Patient connected to face mask oxygen  Post-op Assessment: Report given to PACU RN and Post -op Vital signs reviewed and stable  Post vital signs: Reviewed and stable  Complications: No apparent anesthesia complications

## 2014-09-17 NOTE — Progress Notes (Signed)
Family does not want surgery.  Will sign off

## 2014-09-17 NOTE — Progress Notes (Signed)
    Consult from yesterday reviewed.  AFIB rate controlled EF normal  Please let us know if we can be of further assistance.  Candee Furbish, MD

## 2014-09-17 NOTE — Op Note (Signed)
[facility] [place], [id number]   ERCP PROCEDURE REPORT  PATIENT: Vicki Velasquez, Vicki Velasquez.  MR# :[id number] BIRTHDATE: July 19, 1916  GENDER: Female ENDOSCOPIST: Gatha Mayer, MD, [facility] PROCEDURE DATE:  09/17/2014 PROCEDURE:   ERCP with sphincterotomy/papillotomy and ERCP with stent placement ASA CLASS:   Class III INDICATIONS:established bile duct stone(s).   suspected ascending cholangitis. MEDICATIONS: See Anesthesia Report. TOPICAL ANESTHETIC: none  DESCRIPTION OF PROCEDURE:   After the risks benefits and alternatives of the procedure were thoroughly explained, informed consent was obtained.  The Pentax Ercp Scope [id number]  endoscope was introduced through the mouth  and advanced to the second portion of the duodenum .  1) Normal stomach and duodenum, esophagus not seen well 2) Small but normal major papilla with moderately difficult position due to angulation of D2 sweep, biliary sphincterotomy performed in wire-guided fashion, contrast injected 3) 4 cuboidal CBD stones, mobile in dilated extrahepatic biliary ducts - enable to remove with balloon or basket due to size of papilla/sphincterotomy and angulation 4) 5 cm 10 Fr stent placed to assure drainage 5) Huge calcified gallstone in gallbladder 6) No panccreatogram by intent The scope was then completely withdrawn from the patient and the procedure terminated.     COMPLICATIONS: .  There were no complications.  ENDOSCOPIC IMPRESSION: 1) Normal stomach and duodenum, esophagus not seen well 2) Small but normal major papilla with moderately difficult position due to angulation of D2 sweep 3) 4 cuboidal CBD stones, mobile in dilated extrahepatic biliary ducts - enable to remove with balloon or basket due to size of papilla/sphincterotomy and angulation 4) 5 cm 10 Fr stent placed to assure drainage 5) Huge calcified gallstone in gallbladder 6) No panccreatogram by  intent  RECOMMENDATIONS: Clears and routine labs in AM Continue antibiotics - convert to rals in 1-2 days Will need to decide next step - options are to leave stent and observe understanding risk of cholangitis vs.  Spyglass cholangioscopy and contact lithotripsy at a later date   eSigned:  Gatha Mayer, MD, [facility] 09/17/2014 10:00 AM

## 2014-09-17 NOTE — Anesthesia Postprocedure Evaluation (Signed)
  Anesthesia Post-op Note  Patient: Vicki Velasquez  Procedure(s) Performed: Procedure(s): ENDOSCOPIC RETROGRADE CHOLANGIOPANCREATOGRAPHY (ERCP) (N/A)  Patient Location: PACU and Endoscopy Unit  Anesthesia Type:General  Level of Consciousness: awake, oriented, sedated and patient cooperative  Airway and Oxygen Therapy: Patient Spontanous Breathing  Post-op Pain: none  Post-op Assessment: Post-op Vital signs reviewed, Patient's Cardiovascular Status Stable, Respiratory Function Stable, Patent Airway and No signs of Nausea or vomiting  Post-op Vital Signs: stable  Last Vitals:  Filed Vitals:   09/17/14 1010  BP: 161/67  Pulse: 82  Temp:   Resp: 16    Complications: No apparent anesthesia complications

## 2014-09-17 NOTE — Progress Notes (Signed)
[place] TEAM 1 - Stepdown/ICU TEAM Progress Note  Vicki Velasquez IPJ:[id number] DOB: 1916-11-01 DOA: 09/16/2014 PCP: Estill Dooms, MD  Admit HPI / Brief Narrative: 80 y.o. female with PMH significant for A fib, COPD, porcine aortic valve replacement 2002, and chronic diastolic dysfunction who presented complaining of abdominal pain and SOB. She had been having abdominal pain on and off for years, but pain was getting more frequent and intense. She denied vomiting. Related constipation.   HPI/Subjective: Pt is seen post procedure - she is sedate and somewhat confused, but stable hemodynamically.  She denies abdom pain, sob, or cp at present.    Assessment/Plan:  Sepsis Due to below - beginning to stabilize hemodynamically  Choledocholithiasis w/ early cholangitis  unable to remove 4 stones at ERCP - stent placed - cont abx - pt not interested in surgery - GI following w/ [place]    L heed wound Per WOC recommendations  Chronic Afib  Not on anticoag - rate controlled   S/P AoVR (porcine) 2002 due to AoS  Chronic diastolic CHF euvolemic at present   Hypothyroid Cont home synthroid dose  HTN Resume some of home meds and follow trend   Code Status: FULL Family Communication: spoke w/ daughter at bedside  Disposition Plan: SDU  Consultants: Gen Surgery  GI  Procedures: ERCP - 9/18 - Normal stomach and duodenum - 4 cuboidal CBD stones unable to remove - 5 cm 10 Fr stent placed to assure drainage - huge calcified gallstone in gallbladder  Antibiotics: Zosyn 9/17 >  DVT prophylaxis: lovenox  Objective: Blood pressure 176/93, pulse 84, temperature 97.3 F (36.3 C), temperature source Oral, resp. rate 24, height 5\' 2"  (1.575 m), weight 64.5 kg (142 lb 3.2 oz), SpO2 98.00%.  Intake/Output Summary (Last 24 hours) at 09/17/14 0951 Last data filed at 09/17/14 0930  Gross per 24 hour  Intake    700 ml  Output      0 ml  Net    700 ml   Exam: General: No acute  respiratory distress Lungs: Clear to auscultation bilaterally without wheezes or crackles Cardiovascular: Regular rate w/o gallup, rub, or M Abdomen: Nondistended, soft, bowel sounds positive, no rebound, no ascites, no appreciable mass Extremities: No significant cyanosis, clubbing, or edema bilateral lower extremities  Data Reviewed: Basic Metabolic Panel:  Recent Labs Lab 09/16/14 0220 09/17/14 0256  NA 137 136*  K 3.8 3.5*  CL 97 98  CO2 23 21  GLUCOSE 173* 103*  BUN 16 16  CREATININE 0.76 0.80  CALCIUM 10.1 9.2    Liver Function Tests:  Recent Labs Lab 09/16/14 0220 09/17/14 0256  AST 240* 129*  ALT 195* 158*  ALKPHOS 321* 245*  BILITOT 2.3* 4.0*  PROT 6.9 5.9*  ALBUMIN 3.5 3.1*    Recent Labs Lab 09/16/14 0900  LIPASE 11   Coags: No results found for this basename: PT, INR,  in the last 168 hours No results found for this basename: PTT,  in the last 168 hours  CBC:  Recent Labs Lab 09/16/14 0220 09/17/14 0256  WBC 13.9* 13.0*  NEUTROABS 11.9*  --   HGB 11.4* 10.5*  HCT 34.8* 32.0*  MCV 88.5 90.1  PLT 193 142*    Cardiac Enzymes:  Recent Labs Lab 09/16/14 0900 09/16/14 1535 09/16/14 2100  TROPONINI <0.30 <0.30 <0.30    Recent Results (from the past 240 hour(s))  URINE CULTURE     Status: None   Collection Time  09/16/14  7:32 AM      Result Value Ref Range Status   Specimen Description URINE, CATHETERIZED   Final   Special Requests NONE   Final   Culture  Setup Time     Final   Value: 09/16/2014 13:15     Performed at [facility]     Final   Value: NO GROWTH     Performed at [facility]   Culture     Final   Value: NO GROWTH     Performed at [facility]   Report Status 09/17/2014 FINAL   Final  MRSA PCR SCREENING     Status: None   Collection Time    09/16/14  2:39 PM      Result Value Ref Range Status   MRSA by PCR NEGATIVE  NEGATIVE Final   Comment:            The GeneXpert  MRSA Assay (FDA     approved for NASAL specimens     only), is one component of a     comprehensive MRSA colonization     surveillance program. It is not     intended to diagnose MRSA     infection nor to guide or     monitor treatment for     MRSA infections.     Studies:  Recent x-ray studies have been reviewed in detail by the Attending Physician  Scheduled Meds:  Scheduled Meds: . [MAR HOLD] enoxaparin (LOVENOX) injection  30 mg Subcutaneous Q24H  . indomethacin  100 mg Rectal Once  . [facility] HOLD] levothyroxine  25 mcg Intravenous Daily  . [facility] HOLD] metoprolol  5 mg Intravenous 4 times per day  . [facility] HOLD] oxyCODONE  5 mg Oral Q6H  . [MAR HOLD] piperacillin-tazobactam (ZOSYN)  IV  3.375 g Intravenous Q8H    Time spent on care of this patient: 35 mins   Dessirae Scarola T , MD   [facility] Office  [phone] Pager - Text Page per Shea Evans as per below:  On-Call/Text Page:      [email or web address]      password [email or web address]  If 7PM-7AM, please contact night-coverage [email or web address] Password [email or web address] 09/17/2014, 9:51 AM   LOS: 1 day

## 2014-09-17 NOTE — Anesthesia Preprocedure Evaluation (Addendum)
Anesthesia Evaluation  Patient identified by MRN, date of birth, ID band Patient awake    Reviewed: Allergy & Precautions, H&P , NPO status , Patient's Chart, lab work & pertinent test results, reviewed documented beta blocker date and time   Airway Mallampati: II TM Distance: >3 FB Neck ROM: Full    Dental no notable dental hx. (+) Teeth Intact, Dental Advisory Given   Pulmonary neg pulmonary ROS,  breath sounds clear to auscultation  Pulmonary exam normal       Cardiovascular hypertension, Pt. on medications and Pt. on home beta blockers +CHF + dysrhythmias Atrial Fibrillation + Valvular Problems/Murmurs Rhythm:Irregular Rate:Normal  S/p AVR   Neuro/Psych negative neurological ROS  negative psych ROS   GI/Hepatic Neg liver ROS, GERD-  Medicated and Controlled,  Endo/Other  Hypothyroidism   Renal/GU negative Renal ROS  negative genitourinary   Musculoskeletal  (+) Arthritis -,   Abdominal Normal abdominal exam  (+)   Peds  Hematology negative hematology ROS (+)   Anesthesia Other Findings   Reproductive/Obstetrics negative OB ROS                          Anesthesia Physical Anesthesia Plan  ASA: III  Anesthesia Plan: General   Post-op Pain Management:    Induction: Intravenous  Airway Management Planned: Oral ETT  Additional Equipment:   Intra-op Plan:   Post-operative Plan: Extubation in OR  Informed Consent: I have reviewed the patients History and Physical, chart, labs and discussed the procedure including the risks, benefits and alternatives for the proposed anesthesia with the patient or authorized representative who has indicated his/her understanding and acceptance.   Dental advisory given  Plan Discussed with: Anesthesiologist and Surgeon  Anesthesia Plan Comments:         Anesthesia Quick Evaluation

## 2014-09-18 DIAGNOSIS — K8020 Calculus of gallbladder without cholecystitis without obstruction: Secondary | ICD-10-CM

## 2014-09-18 LAB — COMPREHENSIVE METABOLIC PANEL
ALBUMIN: 2.9 g/dL — AB (ref 3.5–5.2)
ALK PHOS: 228 U/L — AB (ref 39–117)
ALT: 130 U/L — AB (ref 0–35)
ANION GAP: 17 — AB (ref 5–15)
AST: 92 U/L — AB (ref 0–37)
BUN: 24 mg/dL — AB (ref 6–23)
CHLORIDE: 103 meq/L (ref 96–112)
CO2: 20 mEq/L (ref 19–32)
Calcium: 8.9 mg/dL (ref 8.4–10.5)
Creatinine, Ser: 1 mg/dL (ref 0.50–1.10)
GFR calc Af Amer: 54 mL/min — ABNORMAL LOW (ref >90)
GFR calc non Af Amer: 46 mL/min — ABNORMAL LOW (ref >90)
Glucose, Bld: 95 mg/dL (ref 70–99)
POTASSIUM: 3.4 meq/L — AB (ref 3.7–5.3)
SODIUM: 140 meq/L (ref 137–147)
Total Bilirubin: 1.9 mg/dL — ABNORMAL HIGH (ref 0.3–1.2)
Total Protein: 5.9 g/dL — ABNORMAL LOW (ref 6.0–8.3)

## 2014-09-18 LAB — CBC
HCT: 31.8 % — ABNORMAL LOW (ref 36.0–46.0)
Hemoglobin: 10.5 g/dL — ABNORMAL LOW (ref 12.0–15.0)
MCH: 29.8 pg (ref 26.0–34.0)
MCHC: 33 g/dL (ref 30.0–36.0)
MCV: 90.3 fL (ref 78.0–100.0)
Platelets: 142 10*3/uL — ABNORMAL LOW (ref 150–400)
RBC: 3.52 MIL/uL — ABNORMAL LOW (ref 3.87–5.11)
RDW: 14.8 % (ref 11.5–15.5)
WBC: 9.4 10*3/uL (ref 4.0–10.5)

## 2014-09-18 LAB — LIPASE, BLOOD: Lipase: 102 U/L — ABNORMAL HIGH (ref 11–59)

## 2014-09-18 LAB — AMYLASE: Amylase: 105 U/L (ref 0–105)

## 2014-09-18 MED ORDER — LOSARTAN POTASSIUM 50 MG PO TABS
100.0000 mg | ORAL_TABLET | Freq: Every day | ORAL | Status: DC
  Administered 2014-09-18 – 2014-09-21 (×4): 100 mg via ORAL
  Filled 2014-09-18 (×4): qty 2

## 2014-09-18 MED ORDER — ACETAMINOPHEN 500 MG PO TABS
500.0000 mg | ORAL_TABLET | Freq: Four times a day (QID) | ORAL | Status: DC | PRN
  Filled 2014-09-18: qty 1

## 2014-09-18 MED ORDER — LEVOTHYROXINE SODIUM 75 MCG PO TABS
75.0000 ug | ORAL_TABLET | Freq: Every day | ORAL | Status: DC
  Administered 2014-09-19 – 2014-09-21 (×3): 75 ug via ORAL
  Filled 2014-09-18 (×5): qty 1

## 2014-09-18 NOTE — Plan of Care (Signed)
Problem: Phase I Progression Outcomes Goal: Voiding-avoid urinary catheter unless indicated Outcome: Completed/Met Date Met:  09/18/14 Pt voided 150 ml on bedpan

## 2014-09-18 NOTE — Progress Notes (Signed)
[place] TEAM 1 - Stepdown/ICU TEAM Progress Note  Vicki Velasquez TFT:[id number] DOB: October 03, 1916 DOA: 09/16/2014 PCP: Estill Dooms, MD  Admit HPI / Brief Narrative: 80 y.o. female with PMH significant for A fib, COPD, porcine aortic valve replacement 2002, and chronic diastolic dysfunction who presented complaining of abdominal pain and SOB. She had been having abdominal pain on and off for years, but pain was getting more frequent and intense. She denied vomiting. Related constipation.   After admission CT abdom revealed that she was suffering with acute choledocholithiasis w/ probable early cholangitis.  Gen Surg evaluated her, but she was not interested in pursuing surgery of any kind.  GI was consulted.  The pt underwent an ERCP w/ stent and sphincterotomy on 9/18, but the stones were not able to be removed.    HPI/Subjective: Alert and conversant.  No new complaints. Appetite improving.  Denies cp, sob, or abdom pain.    Assessment/Plan:  Sepsis Due to below - has stabilized hemodynamically/sepsis physiology has resolved   Choledocholithiasis w/ early cholangitis  unable to remove 4 stones at ERCP - stent placed w/ sphincterotomy - cont abx - pt not interested in surgery - GI following w/ [place]  - LFTS all improved - Tbil markedly improved - attempt to advance diet  L heed wound Per WOC recommendations - float heel  Chronic Afib  Not on anticoag - rate controlled   S/P AoVR (porcine) 2002 due to AoS  Chronic diastolic CHF euvolemic at present   Hypothyroid Cont home synthroid dose  HTN Resume some of home meds and follow trend   Code Status: FULL Family Communication: spoke w/ daughter at bedside  Disposition Plan: SDU - trasnfer to med bed 9/20 if remains stable over night - begin PT/OT  Consultants: Gen Surgery  GI  Procedures: ERCP - 9/18 - Normal stomach and duodenum - 4 cuboidal CBD stones unable to remove - 5 cm 10 Fr stent placed to assure drainage -  huge calcified gallstone in gallbladder  Antibiotics: Zosyn 9/17 >  DVT prophylaxis: lovenox  Objective: Blood pressure 177/65, pulse 66, temperature 97.6 F (36.4 C), temperature source Oral, resp. rate 20, height 5\' 2"  (1.575 m), weight 62.8 kg (138 lb 7.2 oz), SpO2 97.00%.  Intake/Output Summary (Last 24 hours) at 09/18/14 1606 Last data filed at 09/18/14 1500  Gross per 24 hour  Intake    570 ml  Output    600 ml  Net    -30 ml   Exam: General: No acute respiratory distress Lungs: Clear to auscultation bilaterally without wheezes or crackles Cardiovascular: Regular rate w/o gallup, rub, or M Abdomen: Nondistended, mildly tender in RUQ, soft, bowel sounds positive, no rebound, no ascites, no appreciable mass Extremities: No significant cyanosis, clubbing, or edema bilateral lower extremities  Data Reviewed: Basic Metabolic Panel:  Recent Labs Lab 09/16/14 0220 09/17/14 0256 09/18/14 0305  NA 137 136* 140  K 3.8 3.5* 3.4*  CL 97 98 103  CO2 23 21 20   GLUCOSE 173* 103* 95  BUN 16 16 24*  CREATININE 0.76 0.80 1.00  CALCIUM 10.1 9.2 8.9    Liver Function Tests:  Recent Labs Lab 09/16/14 0220 09/17/14 0256 09/18/14 0305  AST 240* 129* 92*  ALT 195* 158* 130*  ALKPHOS 321* 245* 228*  BILITOT 2.3* 4.0* 1.9*  PROT 6.9 5.9* 5.9*  ALBUMIN 3.5 3.1* 2.9*    Recent Labs Lab 09/16/14 0900 09/18/14 0305  LIPASE 11 102*  AMYLASE  --  105   CBC:  Recent Labs Lab 09/16/14 0220 09/17/14 0256 09/18/14 0305  WBC 13.9* 13.0* 9.4  NEUTROABS 11.9*  --   --   HGB 11.4* 10.5* 10.5*  HCT 34.8* 32.0* 31.8*  MCV 88.5 90.1 90.3  PLT 193 142* 142*   Cardiac Enzymes:  Recent Labs Lab 09/16/14 0900 09/16/14 1535 09/16/14 2100  TROPONINI <0.30 <0.30 <0.30    Recent Results (from the past 240 hour(s))  URINE CULTURE     Status: None   Collection Time    09/16/14  7:32 AM      Result Value Ref Range Status   Specimen Description URINE, CATHETERIZED    Final   Special Requests NONE   Final   Culture  Setup Time     Final   Value: 09/16/2014 13:15     Performed at [facility]     Final   Value: NO GROWTH     Performed at [facility]   Culture     Final   Value: NO GROWTH     Performed at [facility]   Report Status 09/17/2014 FINAL   Final  CULTURE, BLOOD (ROUTINE X 2)     Status: None   Collection Time    09/16/14  8:45 AM      Result Value Ref Range Status   Specimen Description BLOOD RIGHT HAND   Final   Special Requests BOTTLES DRAWN AEROBIC AND ANAEROBIC 10MLS   Final   Culture  Setup Time     Final   Value: 09/16/2014 14:43     Performed at [facility]   Culture     Final   Value:        BLOOD CULTURE RECEIVED NO GROWTH TO DATE CULTURE WILL BE HELD FOR 5 DAYS BEFORE ISSUING A FINAL NEGATIVE REPORT     Performed at [facility]   Report Status PENDING   Incomplete  CULTURE, BLOOD (ROUTINE X 2)     Status: None   Collection Time    09/16/14  9:00 AM      Result Value Ref Range Status   Specimen Description BLOOD LEFT HAND   Final   Special Requests BOTTLES DRAWN AEROBIC ONLY 10MLS   Final   Culture  Setup Time     Final   Value: 09/16/2014 14:43     Performed at [facility]   Culture     Final   Value:        BLOOD CULTURE RECEIVED NO GROWTH TO DATE CULTURE WILL BE HELD FOR 5 DAYS BEFORE ISSUING A FINAL NEGATIVE REPORT     Performed at [facility]   Report Status PENDING   Incomplete  MRSA PCR SCREENING     Status: None   Collection Time    09/16/14  2:39 PM      Result Value Ref Range Status   MRSA by PCR NEGATIVE  NEGATIVE Final   Comment:            The GeneXpert MRSA Assay (FDA     approved for NASAL specimens     only), is one component of a     comprehensive MRSA colonization     surveillance program. It is not     intended to diagnose MRSA     infection nor to guide or     monitor treatment for     MRSA infections.  Studies:  Recent x-ray studies have been reviewed in detail by the Attending Physician  Scheduled Meds:  Scheduled Meds: . enoxaparin (LOVENOX) injection  30 mg Subcutaneous Q24H  . levothyroxine  37.5 mcg Intravenous Daily  . metoprolol succinate  50 mg Oral Daily  . piperacillin-tazobactam (ZOSYN)  IV  3.375 g Intravenous Q8H    Time spent on care of this patient: 35 mins   Nasir Bright T , MD   [facility] Office  [phone] Pager - Text Page per Shea Evans as per below:  On-Call/Text Page:      [email or web address]      password [email or web address]  If 7PM-7AM, please contact night-coverage [email or web address] Password [email or web address] 09/18/2014, 4:06 PM   LOS: 2 days

## 2014-09-18 NOTE — Progress Notes (Signed)
   Tolerating clears Ambulated to bathroom Denies abdominal pain  BP 177/65  Pulse 66  Temp(Src) 97.6 F (36.4 C) (Oral)  Resp 20  Ht 5\' 2"  (1.575 m)  Wt 138 lb 7.2 oz (62.8 kg)  BMI 25.32 kg/m2  SpO2 97%  Awake and alert Abd soft BS + and nontender  Lab Results  Component Value Date   ALT 130* 09/18/2014   AST 92* 09/18/2014   ALKPHOS 228* 09/18/2014   BILITOT 1.9* 09/18/2014   Lab Results  Component Value Date   WBC 9.4 09/18/2014   HGB 10.5* 09/18/2014   HCT 31.8* 09/18/2014   MCV 90.3 09/18/2014   PLT 142* 09/18/2014   Lab Results  Component Value Date   AMYLASE 105 09/18/2014   Lab Results  Component Value Date   LIPASE 102* 09/18/2014   A/P  Choledocholithiasis with obstruction and possibly some cholangitis - s/p ERCP + stent and sphincterotomy Cholelithiasis   Advance diet Move to floor soon Will need outpatient visit w/ me to plan definitive bile duct stone tx Doubt gallbladder stone is a problem   Gatha Mayer, MD, [facility] [phone] (pager) 09/18/2014 3:26 PM

## 2014-09-19 LAB — COMPREHENSIVE METABOLIC PANEL
ALT: 97 U/L — AB (ref 0–35)
AST: 54 U/L — AB (ref 0–37)
Albumin: 2.8 g/dL — ABNORMAL LOW (ref 3.5–5.2)
Alkaline Phosphatase: 193 U/L — ABNORMAL HIGH (ref 39–117)
Anion gap: 15 (ref 5–15)
BUN: 22 mg/dL (ref 6–23)
CHLORIDE: 103 meq/L (ref 96–112)
CO2: 21 mEq/L (ref 19–32)
Calcium: 8.8 mg/dL (ref 8.4–10.5)
Creatinine, Ser: 0.79 mg/dL (ref 0.50–1.10)
GFR calc Af Amer: 79 mL/min — ABNORMAL LOW (ref >90)
GFR, EST NON AFRICAN AMERICAN: 69 mL/min — AB (ref >90)
GLUCOSE: 99 mg/dL (ref 70–99)
Potassium: 3.2 mEq/L — ABNORMAL LOW (ref 3.7–5.3)
Sodium: 139 mEq/L (ref 137–147)
Total Bilirubin: 1.6 mg/dL — ABNORMAL HIGH (ref 0.3–1.2)
Total Protein: 5.7 g/dL — ABNORMAL LOW (ref 6.0–8.3)

## 2014-09-19 LAB — CBC
HCT: 31.4 % — ABNORMAL LOW (ref 36.0–46.0)
HEMOGLOBIN: 10.5 g/dL — AB (ref 12.0–15.0)
MCH: 29.9 pg (ref 26.0–34.0)
MCHC: 33.4 g/dL (ref 30.0–36.0)
MCV: 89.5 fL (ref 78.0–100.0)
PLATELETS: 149 10*3/uL — AB (ref 150–400)
RBC: 3.51 MIL/uL — ABNORMAL LOW (ref 3.87–5.11)
RDW: 14.6 % (ref 11.5–15.5)
WBC: 9.1 10*3/uL (ref 4.0–10.5)

## 2014-09-19 LAB — LIPASE, BLOOD: Lipase: 98 U/L — ABNORMAL HIGH (ref 11–59)

## 2014-09-19 MED ORDER — POTASSIUM CHLORIDE CRYS ER 20 MEQ PO TBCR
40.0000 meq | EXTENDED_RELEASE_TABLET | Freq: Once | ORAL | Status: AC
  Administered 2014-09-19: 40 meq via ORAL
  Filled 2014-09-19: qty 2

## 2014-09-19 NOTE — Progress Notes (Signed)
Report given to Lonnie RN on 5 W 

## 2014-09-19 NOTE — Progress Notes (Signed)
Attempted to call report

## 2014-09-19 NOTE — Progress Notes (Signed)
ANTIBIOTIC CONSULT NOTE - [place] for zosyn Indication: intraabdominal infection  Allergies  Allergen Reactions  . Accupril [Quinapril Hcl] Other (See Comments)    unknown  . Cephalexin     diarrhea  . Clarithromycin   . Codeine   . Demerol   . Fentanyl   . Lexapro [Escitalopram Oxalate]   . Monistat [Miconazole Nitrate-Wipes]   . Prednisone   . Tagamet [Cimetidine]     Patient Measurements: Height: 5\' 2"  (157.5 cm) Weight: 138 lb 0.1 oz (62.6 kg) IBW/kg (Calculated) : 50.1 Body Weight: 61.5 kg  Vital Signs: Temp: 98.3 F (36.8 C) (09/20 1245) Temp src: Oral (09/20 1245) BP: 183/84 mmHg (09/20 1245) Pulse Rate: 69 (09/20 1245) Intake/Output from previous day: 09/19 0701 - 09/20 0700 In: 990 [P.O.:150; I.V.:440; IV Piggyback:400] Out: 650 [Urine:650] Intake/Output from this shift: Total I/O In: 80 [I.V.:30; IV Piggyback:50] Out: -   Labs:  Recent Labs  09/17/14 0256 09/18/14 0305 09/19/14 0240  WBC 13.0* 9.4 9.1  HGB 10.5* 10.5* 10.5*  PLT 142* 142* 149*  CREATININE 0.80 1.00 0.79   Estimated Creatinine Clearance: 36.6 ml/min (by C-G formula based on Cr of 0.79). No results found for this basename: VANCOTROUGH, Corlis Leak, VANCORANDOM, [place], [place], GENTRANDOM, [place], TOBRAPEAK, TOBRARND, AMIKACINPEAK, AMIKACINTROU, AMIKACIN,  in the last 72 hours   Microbiology: Recent Results (from the past 720 hour(s))  URINE CULTURE     Status: None   Collection Time    09/16/14  7:32 AM      Result Value Ref Range Status   Specimen Description URINE, CATHETERIZED   Final   Special Requests NONE   Final   Culture  Setup Time     Final   Value: 09/16/2014 13:15     Performed at [facility]     Final   Value: NO GROWTH     Performed at [facility]   Culture     Final   Value: NO GROWTH     Performed at [facility]   Report Status 09/17/2014 FINAL   Final  CULTURE, BLOOD  (ROUTINE X 2)     Status: None   Collection Time    09/16/14  8:45 AM      Result Value Ref Range Status   Specimen Description BLOOD RIGHT HAND   Final   Special Requests BOTTLES DRAWN AEROBIC AND ANAEROBIC 10MLS   Final   Culture  Setup Time     Final   Value: 09/16/2014 14:43     Performed at [facility]   Culture     Final   Value:        BLOOD CULTURE RECEIVED NO GROWTH TO DATE CULTURE WILL BE HELD FOR 5 DAYS BEFORE ISSUING A FINAL NEGATIVE REPORT     Performed at [facility]   Report Status PENDING   Incomplete  CULTURE, BLOOD (ROUTINE X 2)     Status: None   Collection Time    09/16/14  9:00 AM      Result Value Ref Range Status   Specimen Description BLOOD LEFT HAND   Final   Special Requests BOTTLES DRAWN AEROBIC ONLY 10MLS   Final   Culture  Setup Time     Final   Value: 09/16/2014 14:43     Performed at [facility]   Culture     Final   Value:  BLOOD CULTURE RECEIVED NO GROWTH TO DATE CULTURE WILL BE HELD FOR 5 DAYS BEFORE ISSUING A FINAL NEGATIVE REPORT     Performed at [facility]   Report Status PENDING   Incomplete  MRSA PCR SCREENING     Status: None   Collection Time    09/16/14  2:39 PM      Result Value Ref Range Status   MRSA by PCR NEGATIVE  NEGATIVE Final   Comment:            The GeneXpert MRSA Assay (FDA     approved for NASAL specimens     only), is one component of a     comprehensive MRSA colonization     surveillance program. It is not     intended to diagnose MRSA     infection nor to guide or     monitor treatment for     MRSA infections.    Medical History: Past Medical History  Diagnosis Date  . Atrial flutter   . Hypothyroidism   . Hypertension   . Edema     Leg  . Atrial fibrillation     not on coumadin-managed on meciaitons.  . SOB (shortness of breath) 12/16/10    At rest-no oxygen at rest  . Chronic lung disease     Stable  . Cardiomegaly     Stable  . Disc disease, degenerative,  lumbar or lumbosacral     Dr. Marcha Dutton [facility]-Last Steroids 5 yr ago  . Mixed hearing loss, bilateral   . Ingrowing nail 03/04/2013  . Congestive heart failure, unspecified   . Impacted cerumen 05/13/2012  . Other malaise and fatigue   . Pressure ulcer, heel(707.07)   . Hypopotassemia   . Unspecified constipation   . Hypoxemia   . Other abnormal blood chemistry   . Open fracture of unspecified part of neck of femur   . Other chronic pain   . Atherosclerosis of native arteries of the extremities, unspecified   . Scoliosis (and kyphoscoliosis), idiopathic   . Nausea alone   . Urinary frequency   . Proteinuria   . Senile osteoporosis   . Unspecified vitamin D deficiency   . Herpes zoster with other nervous system complications(053.19)   . Depressive disorder, not elsewhere classified   . Cervicalgia   . Dysphagia, unspecified(787.20)   . Sciatica   . Anemia, unspecified   . Lumbago   . Allergic rhinitis due to pollen   . Abnormality of gait   . Neoplasm of uncertain behavior of skin   . Esophageal reflux   . Mononeuritis of unspecified site   . Diaphragmatic hernia without mention of obstruction or gangrene   . Pain in joint, pelvic region and thigh   . Other and unspecified hyperlipidemia   . Encounter for long-term (current) use of other medications   . Osteoarthrosis, unspecified whether generalized or localized, lower leg   . Other left bundle branch block   . Other heart block   . Dizziness and giddiness   . Undiagnosed cardiac murmurs   . Insomnia, unspecified   . Neuralgia, postherpetic     Assessment: 80yo female on day #4 of Zosyn for choledocholithiasis/cholelithiasis, s/p ERCP with stent placement and sphincterotomy.  9/17 Zosyn>> 9/17 urine>>neg 9/17 blood x2>>ngdt  Goal of Therapy:  Eradication of infection  Plan:  1. Continue Zosyn 3.375g IV Q8h 2. Convert to PO Augmentin (7-10 days total) upon pt discharge per Dr. Carlean Purl 3. Pharmacy will  sign off. Please consult if further assistance needed.  Thanks for allowing pharmacy to be a part of this patient's care.  Drucie Opitz, PharmD Clinical Pharmacy Resident Pager: [phone] 09/19/2014 3:52 PM

## 2014-09-19 NOTE — Progress Notes (Signed)
[place] TEAM 1 - Stepdown/ICU TEAM Progress Note  Vicki Velasquez NLG:[id number] DOB: 04-21-1916 DOA: 09/16/2014 PCP: Estill Dooms, MD  Admit HPI / Brief Narrative: 80 y.o. female with PMH significant for A fib, COPD, porcine aortic valve replacement 2002, and chronic diastolic dysfunction who presented complaining of abdominal pain and SOB. She had been having abdominal pain on and off for years, but pain was getting more frequent and intense. She denied vomiting. Related constipation.   After admission CT abdom revealed that she was suffering with acute choledocholithiasis w/ probable early cholangitis.  Gen Surg evaluated her, but she was not interested in pursuing surgery of any kind.  GI was consulted.  The pt underwent an ERCP w/ stent and sphincterotomy on 9/18, but the stones were not able to be removed.    HPI/Subjective: No new complaints today.  States she continues to feel better each day. Denies n/v, abdom pain, or sob.    Assessment/Plan:  Sepsis Due to below - has stabilized hemodynamically/sepsis physiology has resolved   Choledocholithiasis w/ early cholangitis  unable to remove 4 stones at ERCP - stent placed w/ sphincterotomy - cont abx - pt not interested in surgery - GI following w/ [place]  - LFTS all improved - Tbil markedly improved - advancing diet successfully so far   L heed wound Per WOC recommendations - float heel  Chronic Afib  Not on anticoag - rate controlled   S/P AoVR (porcine) 2002 due to AoS  Chronic diastolic CHF euvolemic at present   Hypothyroid Cont home synthroid dose  HTN Resume some of home meds and follow trend   Hypokalemia  Supplement and follow trend   Code Status: FULL Family Communication: spoke w/ son at bedside  Disposition Plan: transfer to med bed - begin PT/OT  Consultants: Gen Surgery  GI  Procedures: ERCP - 9/18 - Normal stomach and duodenum - 4 cuboidal CBD stones unable to remove - 5 cm 10 Fr stent placed  to assure drainage - huge calcified gallstone in gallbladder  Antibiotics: Zosyn 9/17 >  DVT prophylaxis: lovenox  Objective: Blood pressure 183/84, pulse 69, temperature 98.3 F (36.8 C), temperature source Oral, resp. rate 22, height 5\' 2"  (1.575 m), weight 62.6 kg (138 lb 0.1 oz), SpO2 100.00%.  Intake/Output Summary (Last 24 hours) at 09/19/14 1348 Last data filed at 09/19/14 1020  Gross per 24 hour  Intake    590 ml  Output    350 ml  Net    240 ml   Exam: General: No acute respiratory distress Lungs: Clear to auscultation bilaterally without wheezes or crackles Cardiovascular: Regular rate w/o gallup, rub, or M Abdomen: Nondistended, nontender, soft, bowel sounds positive, no rebound, no ascites, no appreciable mass Extremities: No significant cyanosis, clubbing, or edema bilateral lower extremities  Data Reviewed: Basic Metabolic Panel:  Recent Labs Lab 09/16/14 0220 09/17/14 0256 09/18/14 0305 09/19/14 0240  NA 137 136* 140 139  K 3.8 3.5* 3.4* 3.2*  CL 97 98 103 103  CO2 23 21 20 21   GLUCOSE 173* 103* 95 99  BUN 16 16 24* 22  CREATININE 0.76 0.80 1.00 0.79  CALCIUM 10.1 9.2 8.9 8.8    Liver Function Tests:  Recent Labs Lab 09/16/14 0220 09/17/14 0256 09/18/14 0305 09/19/14 0240  AST 240* 129* 92* 54*  ALT 195* 158* 130* 97*  ALKPHOS 321* 245* 228* 193*  BILITOT 2.3* 4.0* 1.9* 1.6*  PROT 6.9 5.9* 5.9* 5.7*  ALBUMIN 3.5 3.1*  2.9* 2.8*    Recent Labs Lab 09/16/14 0900 09/18/14 0305 09/19/14 0240  LIPASE 11 102* 98*  AMYLASE  --  105  --    CBC:  Recent Labs Lab 09/16/14 0220 09/17/14 0256 09/18/14 0305 09/19/14 0240  WBC 13.9* 13.0* 9.4 9.1  NEUTROABS 11.9*  --   --   --   HGB 11.4* 10.5* 10.5* 10.5*  HCT 34.8* 32.0* 31.8* 31.4*  MCV 88.5 90.1 90.3 89.5  PLT 193 142* 142* 149*   Cardiac Enzymes:  Recent Labs Lab 09/16/14 0900 09/16/14 1535 09/16/14 2100  TROPONINI <0.30 <0.30 <0.30    Recent Results (from the past 240  hour(s))  URINE CULTURE     Status: None   Collection Time    09/16/14  7:32 AM      Result Value Ref Range Status   Specimen Description URINE, CATHETERIZED   Final   Special Requests NONE   Final   Culture  Setup Time     Final   Value: 09/16/2014 13:15     Performed at [place]     Final   Value: NO GROWTH     Performed at [facility]   Culture     Final   Value: NO GROWTH     Performed at [facility]   Report Status 09/17/2014 FINAL   Final  CULTURE, BLOOD (ROUTINE X 2)     Status: None   Collection Time    09/16/14  8:45 AM      Result Value Ref Range Status   Specimen Description BLOOD RIGHT HAND   Final   Special Requests BOTTLES DRAWN AEROBIC AND ANAEROBIC 10MLS   Final   Culture  Setup Time     Final   Value: 09/16/2014 14:43     Performed at [facility]   Culture     Final   Value:        BLOOD CULTURE RECEIVED NO GROWTH TO DATE CULTURE WILL BE HELD FOR 5 DAYS BEFORE ISSUING A FINAL NEGATIVE REPORT     Performed at [facility]   Report Status PENDING   Incomplete  CULTURE, BLOOD (ROUTINE X 2)     Status: None   Collection Time    09/16/14  9:00 AM      Result Value Ref Range Status   Specimen Description BLOOD LEFT HAND   Final   Special Requests BOTTLES DRAWN AEROBIC ONLY 10MLS   Final   Culture  Setup Time     Final   Value: 09/16/2014 14:43     Performed at [facility]   Culture     Final   Value:        BLOOD CULTURE RECEIVED NO GROWTH TO DATE CULTURE WILL BE HELD FOR 5 DAYS BEFORE ISSUING A FINAL NEGATIVE REPORT     Performed at [facility]   Report Status PENDING   Incomplete  MRSA PCR SCREENING     Status: None   Collection Time    09/16/14  2:39 PM      Result Value Ref Range Status   MRSA by PCR NEGATIVE  NEGATIVE Final   Comment:            The GeneXpert MRSA Assay (FDA     approved for NASAL specimens     only), is one component of a     comprehensive MRSA  colonization  surveillance program. It is not     intended to diagnose MRSA     infection nor to guide or     monitor treatment for     MRSA infections.     Studies:  Recent x-ray studies have been reviewed in detail by the Attending Physician  Scheduled Meds:  Scheduled Meds: . enoxaparin (LOVENOX) injection  30 mg Subcutaneous Q24H  . levothyroxine  75 mcg Oral QAC breakfast  . losartan  100 mg Oral Daily  . metoprolol succinate  50 mg Oral Daily  . piperacillin-tazobactam (ZOSYN)  IV  3.375 g Intravenous Q8H    Time spent on care of this patient: 35 mins   MCCLUNG,JEFFREY T , MD   [facility] Office  [phone] Pager - Text Page per Shea Evans as per below:  On-Call/Text Page:      [email or web address]      password [email or web address]  If 7PM-7AM, please contact night-coverage [email or web address] Password [email or web address] 09/19/2014, 1:48 PM   LOS: 3 days

## 2014-09-19 NOTE — Progress Notes (Signed)
   Patient Name: [place] Livings Date of Encounter: 09/19/2014, 11:11 AM    Subjective  Feeling better, eating solid food   Objective  BP 151/48  Pulse 60  Temp(Src) 97.8 F (36.6 C) (Oral)  Resp 21  Ht 5\' 2"  (1.575 m)  Wt 138 lb 0.1 oz (62.6 kg)  BMI 25.24 kg/m2  SpO2 95% abd soft and non-tender    Assessment and Plan   Choledocholithiasis with obstruction and possibly some cholangitis - s/p ERCP + stent and sphincterotomy  Cholelithiasis   Move to floor today? OK by me Outpatient f/u me will be arranged when we see her tomorrow Abx x 7-10 days total - would change to Augmentin at [place], MD, [facility] [phone] (pager) 09/19/2014 11:14 AM

## 2014-09-20 ENCOUNTER — Other Ambulatory Visit: Payer: Self-pay | Admitting: Physician Assistant

## 2014-09-20 ENCOUNTER — Encounter (HOSPITAL_COMMUNITY): Payer: Self-pay | Admitting: Internal Medicine

## 2014-09-20 ENCOUNTER — Inpatient Hospital Stay (HOSPITAL_COMMUNITY): Payer: Medicare Other

## 2014-09-20 DIAGNOSIS — E876 Hypokalemia: Secondary | ICD-10-CM

## 2014-09-20 DIAGNOSIS — K8050 Calculus of bile duct without cholangitis or cholecystitis without obstruction: Secondary | ICD-10-CM

## 2014-09-20 LAB — BASIC METABOLIC PANEL
Anion gap: 18 — ABNORMAL HIGH (ref 5–15)
BUN: 13 mg/dL (ref 6–23)
CALCIUM: 9.4 mg/dL (ref 8.4–10.5)
CO2: 22 meq/L (ref 19–32)
CREATININE: 0.65 mg/dL (ref 0.50–1.10)
Chloride: 97 mEq/L (ref 96–112)
GFR calc Af Amer: 85 mL/min — ABNORMAL LOW (ref >90)
GFR, EST NON AFRICAN AMERICAN: 73 mL/min — AB (ref >90)
Glucose, Bld: 139 mg/dL — ABNORMAL HIGH (ref 70–99)
Potassium: 3.5 mEq/L — ABNORMAL LOW (ref 3.7–5.3)
Sodium: 137 mEq/L (ref 137–147)

## 2014-09-20 LAB — CBC
HCT: 36 % (ref 36.0–46.0)
Hemoglobin: 11.9 g/dL — ABNORMAL LOW (ref 12.0–15.0)
MCH: 29.2 pg (ref 26.0–34.0)
MCHC: 33.1 g/dL (ref 30.0–36.0)
MCV: 88.2 fL (ref 78.0–100.0)
PLATELETS: 194 10*3/uL (ref 150–400)
RBC: 4.08 MIL/uL (ref 3.87–5.11)
RDW: 14.9 % (ref 11.5–15.5)
WBC: 11.7 10*3/uL — AB (ref 4.0–10.5)

## 2014-09-20 LAB — COMPREHENSIVE METABOLIC PANEL
ALT: 80 U/L — ABNORMAL HIGH (ref 0–35)
AST: 39 U/L — ABNORMAL HIGH (ref 0–37)
Albumin: 3.3 g/dL — ABNORMAL LOW (ref 3.5–5.2)
Alkaline Phosphatase: 209 U/L — ABNORMAL HIGH (ref 39–117)
Anion gap: 18 — ABNORMAL HIGH (ref 5–15)
BUN: 14 mg/dL (ref 6–23)
CALCIUM: 9.2 mg/dL (ref 8.4–10.5)
CO2: 20 meq/L (ref 19–32)
CREATININE: 0.64 mg/dL (ref 0.50–1.10)
Chloride: 100 mEq/L (ref 96–112)
GFR, EST AFRICAN AMERICAN: 85 mL/min — AB (ref >90)
GFR, EST NON AFRICAN AMERICAN: 73 mL/min — AB (ref >90)
GLUCOSE: 144 mg/dL — AB (ref 70–99)
Potassium: 3.2 mEq/L — ABNORMAL LOW (ref 3.7–5.3)
Sodium: 138 mEq/L (ref 137–147)
Total Bilirubin: 1.3 mg/dL — ABNORMAL HIGH (ref 0.3–1.2)
Total Protein: 6.8 g/dL (ref 6.0–8.3)

## 2014-09-20 LAB — LIPASE, BLOOD: LIPASE: 38 U/L (ref 11–59)

## 2014-09-20 MED ORDER — HYDRALAZINE HCL 25 MG PO TABS
25.0000 mg | ORAL_TABLET | Freq: Three times a day (TID) | ORAL | Status: DC
  Administered 2014-09-20 – 2014-09-21 (×3): 25 mg via ORAL
  Filled 2014-09-20 (×6): qty 1

## 2014-09-20 MED ORDER — HYDRALAZINE HCL 20 MG/ML IJ SOLN
5.0000 mg | Freq: Once | INTRAMUSCULAR | Status: AC
  Administered 2014-09-20: 5 mg via INTRAVENOUS
  Filled 2014-09-20: qty 1

## 2014-09-20 MED ORDER — FUROSEMIDE 10 MG/ML IJ SOLN
20.0000 mg | Freq: Once | INTRAMUSCULAR | Status: AC
  Administered 2014-09-20: 20 mg via INTRAVENOUS
  Filled 2014-09-20: qty 2

## 2014-09-20 MED ORDER — FUROSEMIDE 20 MG PO TABS
20.0000 mg | ORAL_TABLET | Freq: Every day | ORAL | Status: DC
  Administered 2014-09-21: 20 mg via ORAL
  Filled 2014-09-20: qty 1

## 2014-09-20 MED ORDER — POTASSIUM CHLORIDE ER 10 MEQ PO TBCR
10.0000 meq | EXTENDED_RELEASE_TABLET | Freq: Every day | ORAL | Status: DC
  Administered 2014-09-20 – 2014-09-21 (×2): 10 meq via ORAL
  Filled 2014-09-20 (×3): qty 1

## 2014-09-20 MED ORDER — POTASSIUM CHLORIDE CRYS ER 20 MEQ PO TBCR
40.0000 meq | EXTENDED_RELEASE_TABLET | Freq: Once | ORAL | Status: AC
  Administered 2014-09-20: 40 meq via ORAL
  Filled 2014-09-20: qty 2

## 2014-09-20 NOTE — Progress Notes (Signed)
Pt arrived to unit alert and oriented x4 with caregiver at bedside. Oriented to room, unit, and staff.  Bed in lowest position and call bell is within reach. Healing stage 2 noted to heel. Foam dressing placed. Bottom red but blanchable, foam placed. Will continue to monitor.

## 2014-09-20 NOTE — Progress Notes (Signed)
     [facility] Progress Note  Subjective:   S/p ERCP with stent. No pain. PT got pt up to chair this morning. Pt has not yet been ambulating. Pt plans on going home with her home health aides.    Objective:  Vital signs in last 24 hours: Temp:  [97.9 F (36.6 C)-98.4 F (36.9 C)] 98.2 F (36.8 C) (09/21 0518) Pulse Rate:  [59-82] 77 (09/21 0610) Resp:  [18-23] 18 (09/21 0518) BP: (151-183)/(52-86) 163/85 mmHg (09/21 0649) SpO2:  [95 %-100 %] 99 % (09/21 0518) Weight:  [139 lb 12.4 oz (63.4 kg)-139 lb 15.9 oz (63.5 kg)] 139 lb 12.4 oz (63.4 kg) (09/21 0108) Last BM Date: 09/19/14 General:   Alert,  Well-developed,    in NAD Heart:  Regular rate and rhythm; no murmurs Pulm crackles lower lungs Abdomen:  Soft, nontender and nondistended. Normal bowel sounds, without guarding, and without rebound.   Extremities:  Without edema. Neurologic:  Alert and  oriented x4;  grossly normal neurologically. Psych:  Alert and cooperative. Normal mood and affect.  Intake/Output from previous day: 09/20 0701 - 09/21 0700 In: 190 [I.V.:90; IV Piggyback:100] Out: 2 [Urine:1; Stool:1] Intake/Output this shift: Total I/O In: 100 [P.O.:100] Out: -   Lab Results:  Recent Labs  09/18/14 0305 09/19/14 0240  WBC 9.4 9.1  HGB 10.5* 10.5*  HCT 31.8* 31.4*  PLT 142* 149*   BMET  Recent Labs  09/18/14 0305 09/19/14 0240  NA 140 139  K 3.4* 3.2*  CL 103 103  CO2 20 21  GLUCOSE 95 99  BUN 24* 22  CREATININE 1.00 0.79  CALCIUM 8.9 8.8   LFT  Recent Labs  09/19/14 0240  PROT 5.7*  ALBUMIN 2.8*  AST 54*  ALT 97*  ALKPHOS 193*  BILITOT 1.6*    Dg Chest Port 1 View  09/20/2014   CLINICAL DATA:  Shortness of breath.  EXAM: PORTABLE CHEST - 1 VIEW  COMPARISON:  09/16/2014.  FINDINGS: Trachea is midline. Heart is mildly enlarged. Mixed interstitial and airspace disease appears mild. Probable small bilateral pleural effusions. Pleural parenchymal scarring at the left  apex.  IMPRESSION: Mild congestive heart failure.   Electronically Signed   By: Lorin Picket M.D.   On: 09/20/2014 11:15  1.  ASSESSMENT/PLAN:  1.Choledocholithiasis with obstruction and possibly some cholangitis - s/p ERCP + stent and sphincterotomy  Cholelithiasis Pt will need labs 1 week after discharge and follow up in office 2 weeks from discharge.Marland Kitchen  2.Sepsis. Blood cultures no growth 3.CHF. Has been started on lasix 20 mg daily.(CXR earlier today with mild CHF) 4. Afib. On  Baby asa.     LOS: 4 days   Hvozdovic, Vita Barley PA-C 09/20/2014, Pager [phone]  ________________________________________________________________________  Velora Heckler GI MD note:  I personally examined the patient, reviewed the data and agree with the assessment and plan described above.  We will make sure she has follow up LFTs and office visit.   Owens Loffler, MD [facility] Pager [phone]

## 2014-09-20 NOTE — Progress Notes (Addendum)
PROGRESS NOTE  Vicki Velasquez TJQ:[id number] DOB: 01/11/1916 DOA: 09/16/2014 PCP: Estill Dooms, MD  HPI/Subjective: Pt is a 80 yo female with a history of A fib, COPD, and an aortic valve replacement who is admitted for cholangitis and choledocholelithiasis.  She states she is not in pain today.  She complains of needed the oxygen via nasal cannula at times.  Assessment/Plan: Sepsis  Due to below - has stabilized hemodynamically/sepsis physiology has resolved  Blood cultures show no growth to date.  Choledocholithiasis w/ early cholangitis  CT revealed choledocho with likely early cholangitis.  GI was consulted. Unable to remove 4 stones at ERCP - Stent placed w/ sphincterotomy Pt is not interested in surgery.   GI is following pt. Augmentin recommended at d/c.  GI follow up to be arranged. LFTs and Tbilirubin markedly improved. Continue Zosyn (began 9/17).   Continue to advance diet.  L heel wound  Per WOC recommendations - float heel  Pt states edema has resolved  Chronic Afib  Not on anticoag - (due to age?).  On 81 mg aspirin rate controlled    S/P AoVR (porcine) 2002 due to AoS   Chronic diastolic CHF  (7/62) Pt having some mildly labored breathing.  JVD is present. 2D echo 9/17 with EF of 60 - 65% CXR with mild CHF and pleural effusion Give 20 mg lasix IV x 1 dose and initiate Lasix 20 mg daily.   Hypothyroid  Continue home synthroid dose   HTN  Blood pressure elevated with systolic BP >263.   Continue Cozaar and Metoprolol.  Initiate Hydralazine PO Discontinue HCTZ from home med list. Continue to monitor BP.  Hypokalemia Potassium levels trending downward since admission.  Pt has not been receiving supplements. Give 40 mEq of potassium once and then 10 mEq daily.  Continue to follow trend.   DVT Prophylaxis:  Lovenox  Code Status: FULL Family Communication: Caregiver at bedside.  No family at bedside.  Left a voice mail for son.  Need to  speak with him about Rehab.  Patient does not want Rehab. Disposition Plan: Remain inpatient.    Consultants:  Gastroenterology  Cardiology  Surgery  Procedures:  ERCP with stent placement  2D echo completed 09/16/2014  Antibiotics: Anti-infectives   Start     Dose/Rate Route Frequency Ordered Stop   09/16/14 1730  piperacillin-tazobactam (ZOSYN) IVPB 3.375 g     3.375 g 12.5 mL/hr over 240 Minutes Intravenous Every 8 hours 09/16/14 0918     09/16/14 0915  piperacillin-tazobactam (ZOSYN) IVPB 3.375 g     3.375 g 100 mL/hr over 30 Minutes Intravenous  Once 09/16/14 0912 09/16/14 1036   09/16/14 0745  levofloxacin (LEVAQUIN) IVPB 750 mg     750 mg 100 mL/hr over 90 Minutes Intravenous  Once 09/16/14 0741 09/16/14 0947      Objective: Filed Vitals:   09/20/14 0411 09/20/14 0518 09/20/14 0610 09/20/14 0649  BP:  166/67 169/86 163/85  Pulse:  72 77   Temp:  98.2 F (36.8 C)    TempSrc:  Oral    Resp:  18    Height:      Weight:      SpO2: 99% 99%      Intake/Output Summary (Last 24 hours) at 09/20/14 1123 Last data filed at 09/20/14 0931  Gross per 24 hour  Intake    200 ml  Output      2 ml  Net    198 ml  Filed Weights   09/19/14 0500 09/19/14 1900 09/20/14 0108  Weight: 62.6 kg (138 lb 0.1 oz) 63.5 kg (139 lb 15.9 oz) 63.4 kg (139 lb 12.4 oz)    Exam: General: Elderly female sitting in chair, mildly labored breathing, saturating >95% on room air, appears stated age  Neck: Supple, JVD is present, no masses  Cardiovascular: RRR, S1 S2 auscultated, no rubs, murmurs or gallops.   Respiratory: Mild crackles in lower lung fields. Equal chest rise.   Abdomen: Soft, nontender, nondistended, + bowel sounds  Extremities: lower extremities warm and dry without cyanosis clubbing or edema.  Left ankle is wrapped in bandage due to heel wound.  Neuro: AAOx3, cranial nerves grossly intact. Strength 5/5 in upper and lower extremities  Psych: Normal affect and demeanor  with intact judgement and insight  Data Reviewed: Basic Metabolic Panel:  Recent Labs Lab 09/16/14 0220 09/17/14 0256 09/18/14 0305 09/19/14 0240  NA 137 136* 140 139  K 3.8 3.5* 3.4* 3.2*  CL 97 98 103 103  CO2 23 21 20 21   GLUCOSE 173* 103* 95 99  BUN 16 16 24* 22  CREATININE 0.76 0.80 1.00 0.79  CALCIUM 10.1 9.2 8.9 8.8   Liver Function Tests:  Recent Labs Lab 09/16/14 0220 09/17/14 0256 09/18/14 0305 09/19/14 0240  AST 240* 129* 92* 54*  ALT 195* 158* 130* 97*  ALKPHOS 321* 245* 228* 193*  BILITOT 2.3* 4.0* 1.9* 1.6*  PROT 6.9 5.9* 5.9* 5.7*  ALBUMIN 3.5 3.1* 2.9* 2.8*    Recent Labs Lab 09/16/14 0900 09/18/14 0305 09/19/14 0240  LIPASE 11 102* 98*  AMYLASE  --  105  --    CBC:  Recent Labs Lab 09/16/14 0220 09/17/14 0256 09/18/14 0305 09/19/14 0240  WBC 13.9* 13.0* 9.4 9.1  NEUTROABS 11.9*  --   --   --   HGB 11.4* 10.5* 10.5* 10.5*  HCT 34.8* 32.0* 31.8* 31.4*  MCV 88.5 90.1 90.3 89.5  PLT 193 142* 142* 149*   Cardiac Enzymes:  Recent Labs Lab 09/16/14 0900 09/16/14 1535 09/16/14 2100  TROPONINI <0.30 <0.30 <0.30    Recent Results (from the past 240 hour(s))  URINE CULTURE     Status: None   Collection Time    09/16/14  7:32 AM      Result Value Ref Range Status   Specimen Description URINE, CATHETERIZED   Final   Special Requests NONE   Final   Culture  Setup Time     Final   Value: 09/16/2014 13:15     Performed at [facility]     Final   Value: NO GROWTH     Performed at [facility]   Culture     Final   Value: NO GROWTH     Performed at [facility]   Report Status 09/17/2014 FINAL   Final  CULTURE, BLOOD (ROUTINE X 2)     Status: None   Collection Time    09/16/14  8:45 AM      Result Value Ref Range Status   Specimen Description BLOOD RIGHT HAND   Final   Special Requests BOTTLES DRAWN AEROBIC AND ANAEROBIC 10MLS   Final   Culture  Setup Time     Final   Value: 09/16/2014  14:43     Performed at [facility]   Culture     Final   Value:        BLOOD CULTURE RECEIVED  NO GROWTH TO DATE CULTURE WILL BE HELD FOR 5 DAYS BEFORE ISSUING A FINAL NEGATIVE REPORT     Performed at [facility]   Report Status PENDING   Incomplete  CULTURE, BLOOD (ROUTINE X 2)     Status: None   Collection Time    09/16/14  9:00 AM      Result Value Ref Range Status   Specimen Description BLOOD LEFT HAND   Final   Special Requests BOTTLES DRAWN AEROBIC ONLY 10MLS   Final   Culture  Setup Time     Final   Value: 09/16/2014 14:43     Performed at [facility]   Culture     Final   Value:        BLOOD CULTURE RECEIVED NO GROWTH TO DATE CULTURE WILL BE HELD FOR 5 DAYS BEFORE ISSUING A FINAL NEGATIVE REPORT     Performed at [facility]   Report Status PENDING   Incomplete  MRSA PCR SCREENING     Status: None   Collection Time    09/16/14  2:39 PM      Result Value Ref Range Status   MRSA by PCR NEGATIVE  NEGATIVE Final   Comment:            The GeneXpert MRSA Assay (FDA     approved for NASAL specimens     only), is one component of a     comprehensive MRSA colonization     surveillance program. It is not     intended to diagnose MRSA     infection nor to guide or     monitor treatment for     MRSA infections.     Scheduled Meds: . enoxaparin (LOVENOX) injection  30 mg Subcutaneous Q24H  . [START ON 09/21/2014] furosemide  20 mg Oral Daily  . hydrALAZINE  25 mg Oral 3 times per day  . levothyroxine  75 mcg Oral QAC breakfast  . losartan  100 mg Oral Daily  . metoprolol succinate  50 mg Oral Daily  . piperacillin-tazobactam (ZOSYN)  IV  3.375 g Intravenous Q8H  . potassium chloride  10 mEq Oral Daily   Continuous Infusions: . sodium chloride 10 mL/hr at 09/18/14 1700    Principal Problem:   Cholangitis Active Problems:   Sepsis   Choledocholithiasis with obstruction   S/P aortic valve replacement   Atrial fibrillation   Congestive  heart failure, unspecified   Ulcer of heel   Hypothyroidism   Hypertension   Hypokalemia   [place]   Rockwell Germany, PA-S2  Imogene Burn, PA-C [facility] Pager [phone]. If 7PM-7AM, please contact night-coverage at [email or web address], password [facility] 09/20/2014, 11:23 AM  LOS: 4 days   Patient was seen, examined,treatment plan was discussed with the Physician extender. I have directly reviewed the clinical findings, lab, imaging studies and management of this patient in detail. I have made the necessary changes to the above noted documentation, and agree with the documentation, as recorded by the Physician extender.  Phillips Climes MD [facility].

## 2014-09-20 NOTE — Evaluation (Signed)
Physical Therapy Evaluation Patient Details Name: Vicki Velasquez MRN: [id number] DOB: 1916-06-29 Today's Date: 09/20/2014   History of Present Illness  80 y.o. female with PMH significant for A fib, COPD, aortic valve replacement porcine valve 1761, diastolic dysfunction who presents complaining of abdominal pain, and SOB. She has been having abdominal pain on and off for years, pain is getting more frequent and intense.  Has dyspnea, incontinence of bowel and chest pain now.  Clinical Impression  Pt up in bed with request to use BSC.  Her awareness of her limited strength is fairly accurate but motivated to try anyway.  Her plan is to consider SNF but doesn't have her family present to ask for an approval for the trip.  Will see ongoing and assess the need closer to dc.    Follow Up Recommendations SNF;Supervision/Assistance - 24 hour    Equipment Recommendations  None recommended by PT    Recommendations for Other Services Other (comment)     Precautions / Restrictions Precautions Precautions: Fall Restrictions Weight Bearing Restrictions: No      Mobility  Bed Mobility Overal bed mobility: Needs Assistance Bed Mobility: Rolling;Supine to Sit Rolling: Mod assist   Supine to sit: Mod assist     General bed mobility comments: Cues for hand placement and sequencing  Transfers Overall transfer level: Needs assistance Equipment used: Rolling walker (2 wheeled);1 person hand held assist Transfers: Sit to/from [facility] Sit to Stand: Mod assist;Min assist Stand pivot transfers: Mod assist       General transfer comment: able to wb through feet but needs help to pivot.  Not dependable enough to use walker and requires 2 assist or direct contact to pivot  Ambulation/Gait                Stairs            Wheelchair Mobility    Modified Rankin (Stroke Patients Only)       Balance Overall balance assessment: Needs  assistance Sitting-balance support: Bilateral upper extremity supported;Feet supported Sitting balance-Leahy Scale: Poor   Postural control: Other (comment) Standing balance support: Bilateral upper extremity supported Standing balance-Leahy Scale: Poor Standing balance comment: Direct contact to stand up but with walker very limited upright standing done with mod assist                             Pertinent Vitals/Pain Pain Assessment: No/denies pain BP was 163/85, pulse 77 and O2 sat 99% per nsg notes.    Home Living Family/patient expects to be discharged to:: Private residence Living Arrangements: Non-relatives/Friends Available Help at Discharge: Available 24 hours/day;Personal care attendant Type of Home: House Home Access: [place]: One [place]: Environmental consultant - 2 wheels;Tub bench;Wheelchair - manual Additional Comments: Caretakers 24/7 and has niece and son in town    Prior Function Level of Independence: Needs assistance   Gait / Transfers Assistance Needed: Help to use RW and wc for all outdoor assistance  ADL's / Homemaking Assistance Needed: caretakers assisted her bathing and dressing and housekeeping        Hand Dominance        Extremity/Trunk Assessment   Upper Extremity Assessment: Generalized weakness           Lower Extremity Assessment: Generalized weakness      Cervical / Trunk Assessment: Lordotic  Communication   Communication: No difficulties  Cognition Arousal/Alertness:  Awake/alert Behavior During Therapy: WFL for tasks assessed/performed Overall Cognitive Status: Within Functional Limits for tasks assessed                      General Comments General comments (skin integrity, edema, etc.): Caregiver in the room with pt and able to relate her PLOF.  Pt has many areas of skin change including R heel and UE's with recent fall in BR at home    Exercises        Assessment/Plan     PT Assessment Patient needs continued PT services  PT Diagnosis Generalized weakness   PT Problem List Decreased strength;Decreased range of motion;Decreased activity tolerance;Decreased balance;Decreased mobility;Decreased coordination;Decreased cognition;Decreased knowledge of use of DME;Decreased safety awareness;Decreased skin integrity  PT Treatment Interventions     PT Goals (Current goals can be found in the Care Plan section) Acute Rehab PT Goals Patient Stated Goal: To get onto potty chair today PT Goal Formulation: With patient Time For Goal Achievement: 09/27/14 Potential to Achieve Goals: Fair    Frequency Min 3X/week   Barriers to discharge Other (comment) (Poor ability to assist standing up without skilled help) Pt may be appropriate for SNF care but has no family present to discuss the option    Co-evaluation               End of Session Equipment Utilized During Treatment: Gait belt Activity Tolerance: Patient limited by fatigue;Patient limited by lethargy Patient left: in chair;with call bell/phone within reach;with nursing/sitter in room Nurse Communication: Mobility status;Precautions         Time: 5621-3086 PT Time Calculation (min): 32 min   Charges:   PT Evaluation $Initial PT Evaluation Tier I: 1 Procedure PT Treatments $Therapeutic Activity: 8-22 mins   PT G Codes:          Ramond Dial 2014-10-09, 11:41 AM  Mee Hives, PT MS Acute Rehab Dept. Number: [phone]

## 2014-09-20 NOTE — Clinical Social Work Psychosocial (Signed)
Clinical Social Work Department BRIEF PSYCHOSOCIAL ASSESSMENT 09/20/2014  Patient:  Vicki Velasquez, Vicki Velasquez     Account Number:  [id number]     Admit date:  09/16/2014  Clinical Social Worker:  Lovey Newcomer  Date/Time:  09/20/2014 04:00 PM  Referred by:  Physician  Date Referred:  09/20/2014 Referred for  SNF Placement   Other Referral:   Interview type:  Patient Other interview type:   Patient alert and oriented at time of assessment.    PSYCHOSOCIAL DATA Living Status:  ALONE Admitted from facility:   Level of care:   Primary support name:  Percell Miller Primary support relationship to patient:  CHILD, ADULT Degree of support available:   Support is strong.    CURRENT CONCERNS Current Concerns  Post-Acute Placement   Other Concerns:   NA    SOCIAL WORK ASSESSMENT / PLAN CSW met with patient at bedside to discuss PT recommendation for SNF. Patient states that she is not ready to make this decision at this time and needs to discuss this with her son. Patient lives alone but has 24 hour caregivers at home. One caregiver is at bedside states that the patient manages fine at home, but the other caregiver that is with patient during the day is not going to be able to care for the patient if the patient remains a 2 person assist. Patient lives in [place] and the caregiver states that the son will want patient placed as close to [place] as possible as it is difficult for him to travel far. Patient states that she would like to go home if she can but is open to SNF referrals being made. The decision for SNF seems overwhelming for patient.   Assessment/plan status:  Psychosocial Support/Ongoing Assessment of Needs Other assessment/ plan:   Complete Fl2, Fax, PASRR   Information/referral to community resources:   CSW contact informaition and SNF list left with patient and caregiver.    PATIENT'S/FAMILY'S RESPONSE TO PLAN OF CARE: Patient is undecided about SNF placement but  is agreeable to referrals to being made. CSW will assist.       Liz Beach MSW, [place], [place], [phone]

## 2014-09-20 NOTE — Progress Notes (Signed)
CARE MANAGEMENT NOTE 09/20/2014  Patient:  Vicki Velasquez, Vicki Velasquez   Account Number:  [id number]  Date Initiated:  09/17/2014  Documentation initiated by:  Marvetta Gibbons  Subjective/Objective Assessment:   Pt admitted with sepsis     Action/Plan:   PTA pt lived at home with private sitter  pt eval- rec snf   Anticipated DC Date:  09/21/2014   Anticipated DC Plan:  SKILLED NURSING FACILITY  In-house referral  Clinical Social Worker      DC Planning Services  CM consult      Choice offered to / List presented to:             Status of service:  In process, will continue to follow Medicare Important Message given?  YES (If response is "NO", the following Medicare IM given date fields will be blank) Date Medicare IM given:  09/20/2014 Medicare IM given by:  Tomi Bamberger Date Additional Medicare IM given:   Additional Medicare IM given by:    Discharge Disposition:    Per UR Regulation:  Reviewed for med. necessity/level of care/duration of stay  If discussed at [place] of Stay Meetings, dates discussed:    Comments:  09/17/14- 1215- Marvetta Gibbons RN BSN [phone] for ERCP with  stent today, Surgery is refused by the family at this point in time- NCM to follow for d/c needs

## 2014-09-20 NOTE — Plan of Care (Signed)
Problem: Acute Rehab PT Goals(only PT should resolve) Goal: Patient Will Transfer Sit To/From Stand Using correct hand placement    Goal: Pt Will Transfer Bed To Chair/Chair To Bed With correct hand placement Goal: Pt Will Ambulate Level surfaces

## 2014-09-20 NOTE — Progress Notes (Signed)
NP Rogue Bussing notified about patient's b/p this morning.

## 2014-09-20 NOTE — Progress Notes (Signed)
Patient reported that she was having a hard time breathing. RN came into room and assisted patient to sitting position and had O2 stat taken- 96%. Patient was placed on 1 liter for comfort. She stated that she felt a whole lot better after the oxygen was applied. Will leave O2 on for comfort. Will continue to monitor

## 2014-09-20 NOTE — Evaluation (Signed)
Occupational Therapy Evaluation Patient Details Name: Vicki Velasquez MRN: [id number] DOB: 06-15-1916 Today's Date: 09/20/2014    History of Present Illness 80 y.o. female with PMH significant for A fib, COPD, aortic valve replacement porcine valve 6195, diastolic dysfunction who presents complaining of abdominal pain, and SOB. She has been having abdominal pain on and off for years, pain is getting more frequent and intense.  Has dyspnea, incontinence of bowel and chest pain now.   Clinical Impression   Pt admitted with above. She demonstrates the below listed deficits and will benefit from continued OT to maximize safety and independence with BADLs.  Pt presents to OT with generalized weakness and impaired balance.  Currently, pt is requiring mod A for BADLs.  Caregiver was present during eval and reports that only 1-2 of pt's caregivers are able to provide physical assistance/lifting at discharge.  Based on this info, feel she will need SNF level rehab prior to return home;  However, if pt is able to progress to supervision - min guard assist for transfers, she may be able to return home with 24/7 caregiver support.       Follow Up Recommendations  SNF;Supervision/Assistance - 24 hour    Equipment Recommendations  None recommended by OT    Recommendations for Other Services       Precautions / Restrictions Precautions Precautions: Fall Restrictions Weight Bearing Restrictions: No      Mobility Bed Mobility Overal bed mobility: Needs Assistance Bed Mobility: Rolling;Supine to Sit Rolling: Mod assist   Supine to sit: Mod assist     General bed mobility comments: Cues for hand placement and sequencing  Transfers Overall transfer level: Needs assistance Equipment used: Rolling walker (2 wheeled);1 person hand held assist Transfers: Sit to/from [facility] Sit to Stand: Mod assist;Min assist Stand pivot transfers: Mod assist       General transfer  comment: Pt required mod A for first sit to stand, but progressed to min A.   Pt with posterior lean requiring light mod A for stand pivot transfer to [facility]    Balance Overall balance assessment: Needs assistance Sitting-balance support: Feet supported Sitting balance-Leahy Scale: Fair   Postural control: Posterior lean Standing balance support: Bilateral upper extremity supported Standing balance-Leahy Scale: Poor Standing balance comment: Direct contact to stand up but with walker very limited upright standing done with mod assist                            ADL Overall ADL's : Needs assistance/impaired Eating/Feeding: Set up   Grooming: Wash/dry hands;Wash/dry face;Oral care;Brushing hair;Set up;Sitting   Upper Body Bathing: Set up;Sitting   Lower Body Bathing: Moderate assistance;Sit to/from stand   Upper Body Dressing : Minimal assistance;Sitting   Lower Body Dressing: Total assistance;Sit to/from stand   Toilet Transfer: Moderate assistance;Stand-pivot;BSC   Toileting- Clothing Manipulation and Hygiene: Maximal assistance;Sit to/from stand       Functional mobility during ADLs: Moderate assistance;Rolling walker General ADL Comments: Pt moves slowly.  She demonstrates posterior lean with attempts to stand.  She is eager to regain her independence     Vision                     Perception     Praxis      Pertinent Vitals/Pain Pain Assessment: No/denies pain     Hand Dominance Right   Extremity/Trunk Assessment Upper Extremity Assessment Upper Extremity Assessment: Generalized weakness  Lower Extremity Assessment Lower Extremity Assessment: Defer to PT evaluation   Cervical / Trunk Assessment Cervical / Trunk Assessment: Lordotic   Communication Communication Communication: HOH   Cognition Arousal/Alertness: Awake/alert Behavior During Therapy: WFL for tasks assessed/performed Overall Cognitive Status: Within Functional Limits for  tasks assessed                     General Comments       Exercises       Shoulder Instructions      Home Living Family/patient expects to be discharged to:: Private residence Living Arrangements: Non-relatives/Friends Available Help at Discharge: Available 24 hours/day;Personal care attendant Type of Home: House Home Access: Ramped entrance     Home Layout: One level     Bathroom Shower/Tub: Tub/shower unit;Curtain (Pt sponge bathes)   Bathroom Toilet: Standard     Home Equipment: Environmental consultant - 2 wheels;Tub bench;Wheelchair - manual;Bedside commode   Additional Comments: Caretakers 24/7 and has niece and son in town      Prior Functioning/Environment Level of Independence: Needs assistance  Gait / Transfers Assistance Needed: Per caregiver, pt is able to transfer to/from w/c mod I and ambulate short distances into BR mod I.   ADL's / Homemaking Assistance Needed: Per caregiver, pt was able to bathe self after set up; required assist for dressing, and was mod I for toileting and grooming.  Communication / Swallowing Assistance Needed: HOH      OT Diagnosis: Generalized weakness   OT Problem List: Decreased strength;Decreased activity tolerance;Impaired balance (sitting and/or standing);Decreased knowledge of use of DME or AE   OT Treatment/Interventions: Self-care/ADL training;DME and/or AE instruction;Therapeutic activities;Balance training;Patient/family education    OT Goals(Current goals can be found in the care plan section) Acute Rehab OT Goals Patient Stated Goal: to get stronger OT Goal Formulation: With patient Time For Goal Achievement: 10/04/14 Potential to Achieve Goals: Good ADL Goals Pt Will Perform Grooming: with min guard assist;standing Pt Will Perform Upper Body Bathing: with set-up;sitting Pt Will Perform Lower Body Bathing: with min guard assist;sit to/from stand Pt Will Transfer to Toilet: with min guard assist;ambulating;bedside  commode;regular height toilet;grab bars Pt Will Perform Toileting - Clothing Manipulation and hygiene: with min guard assist;sit to/from stand  OT Frequency: Min 2X/week   Barriers to D/C: Decreased caregiver support  caregivers may not be able to provide necessary level of assist pt requires        Co-evaluation              End of Session Equipment Utilized During Treatment: Rolling walker Nurse Communication: Mobility status  Activity Tolerance: Patient tolerated treatment well Patient left: in chair;with call bell/phone within reach;with family/visitor present   Time: 1358-1420 OT Time Calculation (min): 22 min Charges:  OT General Charges $OT Visit: 1 Procedure OT Evaluation $Initial OT Evaluation Tier I: 1 Procedure OT Treatments $Self Care/Home Management : 8-22 mins G-Codes:    Amanda Pote M 10-08-2014, 2:33 PM

## 2014-09-20 NOTE — Clinical Social Work Placement (Signed)
Clinical Social Work Department CLINICAL SOCIAL WORK PLACEMENT NOTE 09/20/2014  Patient:  Vicki Velasquez, Vicki Velasquez  Account Number:  [id number] [place] date:  09/16/2014  Clinical Social Worker:  Kemper Durie, [place]  Date/time:  09/20/2014 04:00 PM  Clinical Social Work is seeking post-discharge placement for this patient at the following level of care:   [place]   (*CSW will update this form in Epic as items are completed)   09/20/2014  Patient/family provided with [place] Department of Clinical Social Work's list of facilities offering this level of care within the geographic area requested by the patient (or if unable, by the patient's family).  09/20/2014  Patient/family informed of their freedom to choose among providers that offer the needed level of care, that participate in Medicare, Medicaid or managed care program needed by the patient, have an available bed and are willing to accept the patient.  09/20/2014  Patient/family informed of [facility]' ownership interest in [facility], as well as of the fact that they are under no obligation to receive care at this facility.  PASARR submitted to EDS on  PASARR number received on   FL2 transmitted to all facilities in geographic area requested by pt/family on  09/20/2014 FL2 transmitted to all facilities within larger geographic area on   Patient informed that his/her managed care company has contracts with or will negotiate with  certain facilities, including the following:     Patient/family informed of bed offers received:   Patient chooses bed at  Physician recommends and patient chooses bed at    Patient to be transferred to  on   Patient to be transferred to facility by  Patient and family notified of transfer on  Name of family member notified:    The following physician request were entered in Epic:   Additional Comments:    Liz Beach MSW, [place], [place], [phone]

## 2014-09-20 NOTE — Progress Notes (Signed)
Patient received 5mg  of hydralazine. B/p did not decrease much. Will pass on to day RN for MD to be made aware.

## 2014-09-21 LAB — CBC
HCT: 32.5 % — ABNORMAL LOW (ref 36.0–46.0)
Hemoglobin: 10.6 g/dL — ABNORMAL LOW (ref 12.0–15.0)
MCH: 28.9 pg (ref 26.0–34.0)
MCHC: 32.6 g/dL (ref 30.0–36.0)
MCV: 88.6 fL (ref 78.0–100.0)
PLATELETS: 176 10*3/uL (ref 150–400)
RBC: 3.67 MIL/uL — ABNORMAL LOW (ref 3.87–5.11)
RDW: 14.9 % (ref 11.5–15.5)
WBC: 8.7 10*3/uL (ref 4.0–10.5)

## 2014-09-21 LAB — BASIC METABOLIC PANEL
Anion gap: 15 (ref 5–15)
BUN: 12 mg/dL (ref 6–23)
CALCIUM: 9.1 mg/dL (ref 8.4–10.5)
CO2: 25 mEq/L (ref 19–32)
Chloride: 100 mEq/L (ref 96–112)
Creatinine, Ser: 0.72 mg/dL (ref 0.50–1.10)
GFR, EST AFRICAN AMERICAN: 82 mL/min — AB (ref >90)
GFR, EST NON AFRICAN AMERICAN: 71 mL/min — AB (ref >90)
Glucose, Bld: 118 mg/dL — ABNORMAL HIGH (ref 70–99)
Potassium: 3.4 mEq/L — ABNORMAL LOW (ref 3.7–5.3)
Sodium: 140 mEq/L (ref 137–147)

## 2014-09-21 LAB — HEPATIC FUNCTION PANEL
ALT: 58 U/L — ABNORMAL HIGH (ref 0–35)
AST: 28 U/L (ref 0–37)
Albumin: 3.1 g/dL — ABNORMAL LOW (ref 3.5–5.2)
Alkaline Phosphatase: 170 U/L — ABNORMAL HIGH (ref 39–117)
BILIRUBIN TOTAL: 1.4 mg/dL — AB (ref 0.3–1.2)
Bilirubin, Direct: 0.5 mg/dL — ABNORMAL HIGH (ref 0.0–0.3)
Indirect Bilirubin: 0.9 mg/dL (ref 0.3–0.9)
Total Protein: 6.2 g/dL (ref 6.0–8.3)

## 2014-09-21 LAB — CLOSTRIDIUM DIFFICILE BY PCR: Toxigenic C. Difficile by PCR: NEGATIVE

## 2014-09-21 MED ORDER — HYDROCORTISONE 2.5 % RE CREA: 1.0000 "application " | TOPICAL_CREAM | Freq: Two times a day (BID) | RECTAL | Status: DC | PRN

## 2014-09-21 MED ORDER — LOSARTAN POTASSIUM 100 MG PO TABS: 100.0000 mg | ORAL_TABLET | Freq: Every day | ORAL | Status: DC

## 2014-09-21 MED ORDER — PNEUMOCOCCAL VAC POLYVALENT 25 MCG/0.5ML IJ INJ: 0.5000 mL | INJECTION | INTRAMUSCULAR | Status: DC | PRN

## 2014-09-21 MED ORDER — HYDROCORTISONE 2.5 % RE CREA: 1.0000 "application " | TOPICAL_CREAM | Freq: Two times a day (BID) | RECTAL | Status: AC | PRN

## 2014-09-21 MED ORDER — CIPROFLOXACIN HCL 500 MG PO TABS: 500.0000 mg | ORAL_TABLET | Freq: Two times a day (BID) | ORAL | Status: DC

## 2014-09-21 MED ORDER — POTASSIUM CHLORIDE CRYS ER 20 MEQ PO TBCR
40.0000 meq | EXTENDED_RELEASE_TABLET | Freq: Once | ORAL | Status: AC
  Administered 2014-09-21: 40 meq via ORAL
  Filled 2014-09-21: qty 2

## 2014-09-21 MED ORDER — METRONIDAZOLE 500 MG PO TABS: 500.0000 mg | ORAL_TABLET | Freq: Three times a day (TID) | ORAL | Status: DC

## 2014-09-21 MED ORDER — FUROSEMIDE 20 MG PO TABS: 20.0000 mg | ORAL_TABLET | Freq: Every day | ORAL | Status: AC

## 2014-09-21 NOTE — Progress Notes (Signed)
Reviewed discharge paper work and prescriptions w/ caregiver.  PIV removed.  Pt taken home by EMS.

## 2014-09-21 NOTE — Progress Notes (Signed)
     [facility] Progress Note  Subjective:   S/P ERCP with stenting.  Pt and family want pt to go home with HHA, do not want SNF placement. Pt had several loose stools last pm. No abd pain. No nausea or vomiting.   Objective:  Vital signs in last 24 hours: Temp:  [98 F (36.7 C)-98.6 F (37 C)] 98.6 F (37 C) (09/22 0535) Pulse Rate:  [75-89] 75 (09/22 0535) Resp:  [18] 18 (09/22 0535) BP: (150-168)/(58-63) 160/61 mmHg (09/22 0535) SpO2:  [98 %-99 %] 99 % (09/22 0535) Weight:  [141 lb 1.5 oz (64 kg)] 141 lb 1.5 oz (64 kg) (09/22 0535) Last BM Date: 09/20/14 General:   Alert,  Well-developed,    in NAD Heart:  Regular rate and rhythm; no murmurs Pulm lungs clear to ausc bilat Abdomen:  Soft, nontender and nondistended. Normal bowel sounds, without guarding, and without rebound.   Extremities:  Without edema. Neurologic:  Alert and  oriented x4;  grossly normal neurologically. Psych:  Alert and cooperative. Normal mood and affect.  Intake/Output from previous day: 09/21 0701 - 09/22 0700 In: 150 [P.O.:100; IV Piggyback:50] Out: 100 [Urine:100] Intake/Output this shift: Total I/O In: 118 [P.O.:118] Out: -   Lab Results:  Recent Labs  09/19/14 0240 09/20/14 1157 09/21/14 0817  WBC 9.1 11.7* 8.7  HGB 10.5* 11.9* 10.6*  HCT 31.4* 36.0 32.5*  PLT 149* 194 176   BMET  Recent Labs  09/20/14 1157 09/20/14 1520 09/21/14 0817  NA 138 137 140  K 3.2* 3.5* 3.4*  CL 100 97 100  CO2 20 22 25   GLUCOSE 144* 139* 118*  BUN 14 13 12   CREATININE 0.64 0.65 0.72  CALCIUM 9.2 9.4 9.1   LFT  Recent Labs  09/21/14 0817  PROT 6.2  ALBUMIN 3.1*  AST 28  ALT 58*  ALKPHOS 170*  BILITOT 1.4*  BILIDIR 0.5*  IBILI 0.9    Dg Chest Port 1 View  09/20/2014   CLINICAL DATA:  Shortness of breath.  EXAM: PORTABLE CHEST - 1 VIEW  COMPARISON:  09/16/2014.  FINDINGS: Trachea is midline. Heart is mildly enlarged. Mixed interstitial and airspace disease appears  mild. Probable small bilateral pleural effusions. Pleural parenchymal scarring at the left apex.  IMPRESSION: Mild congestive heart failure.   Electronically Signed   By: Lorin Picket M.D.   On: 09/20/2014 11:15    ASSESSMENT/PLAN:    1.Choledocholithiasis with obstruction and possibly some cholangitis - s/p ERCP + stent and sphincterotomy  Cholelithiasis  Has orders for follow up blood work on 9/28 and an appt to follow up with Dr Carlean Purl on Oct 5...   2.Sepsis. Blood cultures no growth  3.CHF. Was started on lasix 20 mg qd yesterday. 4. Afib. On Baby asa. 5. Diarrhea. Likely antibiotic associated. Stool for c diff ordered.     LOS: 5 days   Hvozdovic, Deloris Ping 09/21/2014, Pager [phone]  ________________________________________________________________________  [facility] Heckler GI MD note:  I personally examined the patient, reviewed the data and agree with the assessment and plan described above.  Please call if any further questions or concerns.     Owens Loffler, MD [facility] Pager [phone]

## 2014-09-21 NOTE — Progress Notes (Signed)
CARE MANAGEMENT NOTE 09/21/2014  Patient:  Vicki Velasquez, Vicki Velasquez   Account Number:  [id number]  Date Initiated:  09/17/2014  Documentation initiated by:  Marvetta Gibbons  Subjective/Objective Assessment:   Pt admitted with sepsis     Action/Plan:   PTA pt lived at home with private sitter  pt eval- rec snf   Anticipated DC Date:  09/21/2014   Anticipated DC Plan:  SKILLED NURSING FACILITY  In-house referral  Clinical Social Worker      DC Forensic scientist  CM consult      [facility] Choice  HOME HEALTH   Choice offered to / List presented to:  C-1 Patient        [place] arranged  HH-1 RN  [place].   Status of service:  In process, will continue to follow Medicare Important Message given?  YES (If response is "NO", the following Medicare IM given date fields will be blank) Date Medicare IM given:  09/20/2014 Medicare IM given by:  Tomi Bamberger Date Additional Medicare IM given:   Additional Medicare IM given by:    Discharge Disposition:  [place]  Per UR Regulation:  Reviewed for med. necessity/level of care/duration of stay  If discussed at [place] of Stay Meetings, dates discussed:    Comments:  09/21/2014 1100 NCM spoke to pt and offered choice for [facility]. Agreeable to [facility] for [facility]. Caregiver in the room states she has 24 hour caregivers in the home. Pt has RW, and wheelchair at home. Notified [facility] for [facility] for scheduled dc home today.  Jonnie Finner RN Case Mgmt phone [phone]  09/17/14- 1215Marvetta Gibbons RN BSN [phone] for ERCP with  stent today, Surgery is refused by the family at this point in time- NCM to follow for d/c needs

## 2014-09-21 NOTE — Clinical Social Work Note (Signed)
Per MD patient ready to DC back to Home. RN, patient/family notified of patient's DC. DC packet on patient's chart. Ambulance transport requested for patient. CSW signing off at this time.   Liz Beach MSW, [place], [place], [phone]

## 2014-09-21 NOTE — Discharge Summary (Addendum)
Physician Discharge Summary  Vicki Velasquez ZDG:[id number] DOB: January 15, 1916 DOA: 09/16/2014  PCP: Estill Dooms, MD  Admit date: 09/16/2014 Discharge date: 09/21/2014  Time spent: 45 minutes  Recommendations for Outpatient Follow-up:  1. Follow up with [place] [facility] to discuss further management of choledocholelithiasis and ensure resolution of cholangitis.  Follow up with [facility] to optimize regimen for chronic diastolic CHF.   2. Discharging to home with 24 hour care at home. 3. Cbc / bmet on PCP follow up.  Patient restarted on lasix.   Discharge Diagnoses:  Principal Problem:   Cholangitis Active Problems:   S/P aortic valve replacement   Hypothyroidism   Hypertension   Atrial fibrillation   Congestive heart failure, unspecified   Ulcer of heel   Gallstone   Sepsis   Choledocholithiasis with obstruction   Hypokalemia   Discharge Condition: stable   Diet recommendation: heart healthy diet - low fat.  Filed Weights   09/19/14 1900 09/20/14 0108 09/21/14 0535  Weight: 63.5 kg (139 lb 15.9 oz) 63.4 kg (139 lb 12.4 oz) 64 kg (141 lb 1.5 oz)    History of present illness:  80 y.o. female with PMH significant for A fib, COPD, porcine aortic valve replacement 2002, and chronic diastolic dysfunction who presented complaining of abdominal pain and SOB. She had been having abdominal pain on and off for years, but pain was getting more frequent and intense. She denied vomiting. Related constipation.   Hospital Course:  Sepsis  Due to cholangitis - has stabilized hemodynamically/sepsis physiology has resolved.   Patient was admitted to [facility] and treated with IV antibiotics (Zosyn) Blood cultures show no growth to date.   Choledocholithiasis w/ early cholangitis  CT revealed choledocho with likely early cholangitis.  [facility] was consulted.  Unable to remove 4 stones with ERCP - Stent placed w/ sphincterotomy  Pt is not interested in surgery.   She was initially treated with Zosyn.  Patient will be discharged on Cipro/Flagyl for a total antibiotic course of 12 days. LFTs and Tbilirubin markedly improved.  Epigastric pain improved.  Tolerating diet.  L heel wound  Per [facility] recommendations - float heel  Pt states edema has resolved   Chronic Afib  Not on anticoag - (due to age?). On 81 mg aspirin  Rate controlled with Metoprolol.   S/P AoVR (porcine) 2002 due to AoS   Chronic diastolic CHF  (9/51) Pt had mildly labored breathing.  2D echo repeated this admission.  Results copied below. CXR with mild CHF and pleural effusion  Give 20 mg lasix IV x 1 dose and initiated lasix 20 mg daily.   Hypothyroid  Continue home synthroid dose   HTN  Blood pressure elevated with systolic BP >884.  Continue Cozaar and Metoprolol. Initiate Hydralazine PO  Discontinued HCTZ from home med list.  Continue to monitor BP.   Hypokalemia  Potassium levels trending downward since admission. Pt has not been receiving supplements.  Give 40 mEq of potassium once and then 10 mEq daily. Potassium started to increase. Will discharge patient on 20 mEq daily since labs still show hypokalemia and discharging on HCTZ.  Diarrhea Pt has had some loose stools since 9/21.  Possibly due to antibiotic.  PCR for C Diff is negative.    Procedures:  ERCP with stent placement  2D echo on 9/17 2D Echo Study Conclusions  - Left ventricle: The cavity size was normal. Wall thickness was normal. Systolic function was normal. The estimated ejection fraction  was in the range of 60% to 65%. Wall motion was normal; there were no regional wall motion abnormalities. - Aortic valve: Moderately calcified annulus. Valve area (VTI):  1.04 cm^2. Valve area (Vmax): 0.99 cm^2. Valve area (Vmean): 1.06 cm^2. - Mitral valve: Moderately calcified annulus. Moderately thickened, moderately calcified leaflets . The findings are consistent with mild to moderate stenosis. There  was mild to moderate regurgitation. - Left atrium: The atrium was severely dilated. - Right atrium: The atrium was severely dilated. - Tricuspid valve: There was severe regurgitation. - Pulmonary arteries: Systolic pressure was moderately increased. PA peak pressure: 59 mm Hg (S).   Consultations:  Gastroenterology  Cardiology  Surgery  Discharge Exam: Filed Vitals:   09/21/14 0535  BP: 160/61  Pulse: 75  Temp: 98.6 F (37 C)  Resp: 18    Discharge Instructions Discharge Instructions   Diet - low sodium heart healthy    Complete by:  As directed      Increase activity slowly    Complete by:  As directed           Current Discharge Medication List    START taking these medications   Details  ciprofloxacin (CIPRO) 500 MG tablet Take 1 tablet (500 mg total) by mouth 2 (two) times daily. Qty: 12 tablet, Refills: 0    furosemide (LASIX) 20 MG tablet Take 1 tablet (20 mg total) by mouth daily. Qty: 30 tablet, Refills: 3    hydrocortisone (PROCTOSOL HC) 2.5 % rectal cream Place 1 application rectally 2 (two) times daily as needed for hemorrhoids or itching. Patient denies any serious reaction to steroids. Qty: 30 g, Refills: 0    losartan (COZAAR) 100 MG tablet Take 1 tablet (100 mg total) by mouth daily. Qty: 30 tablet, Refills: 0    metroNIDAZOLE (FLAGYL) 500 MG tablet Take 1 tablet (500 mg total) by mouth 3 (three) times daily. Qty: 18 tablet, Refills: 0      CONTINUE these medications which have NOT CHANGED   Details  Ascorbic Acid (VITAMIN C) 1000 MG tablet Take 1,000 mg by mouth daily.    aspirin EC 81 MG tablet Take 81 mg by mouth daily.    DULoxetine (CYMBALTA) 30 MG capsule Take 30 mg by mouth daily.    levothyroxine (SYNTHROID, LEVOTHROID) 75 MCG tablet Take 75 mcg by mouth daily before breakfast.    metoprolol succinate (TOPROL-XL) 50 MG 24 hr tablet Take 50 mg by mouth daily. Take with or immediately following a meal.    Multiple Vitamin  (MULITIVITAMIN WITH MINERALS) TABS Take 1 tablet by mouth daily.    omeprazole (PRILOSEC) 20 MG capsule Take 20 mg by mouth 2 (two) times daily before a meal.    potassium chloride (KLOR-CON 10) 10 MEQ tablet Take 10 mEq by mouth daily.    ferrous sulfate 325 (65 FE) MG tablet Take 1 tablet (325 mg total) by mouth daily with breakfast.    ibuprofen (ADVIL) 200 MG tablet Take 200 mg by mouth every 6 (six) hours as needed for pain.    oxyCODONE (OXY IR/ROXICODONE) 5 MG immediate release tablet Take 5 mg by mouth every 6 (six) hours.    senna (SENOKOT) 8.6 MG tablet Take 1 tablet by mouth.    sennosides-docusate sodium (SENOKOT-S) 8.6-50 MG tablet Take 1 tablet by mouth as needed for constipation.     TDaP (BOOSTRIX) 5-2.5-18.5 LF-MCG/0.5 injection Inject 0.5 mLs into the muscle once. Qty: 0.5 mL, Refills: 0    trolamine salicylate (  ASPERCREME/ALOE) 10 % cream Apply 1 application topically as needed for muscle pain. Apply to painful elbow 4 times daily Qty: 85 g, Refills: 0   Associated Diagnoses: Right elbow pain      STOP taking these medications     losartan-hydrochlorothiazide (HYZAAR) 100-12.5 MG per tablet        Allergies  Allergen Reactions  . Accupril [Quinapril Hcl] Other (See Comments)    unknown  . Cephalexin     diarrhea  . Clarithromycin   . Codeine   . Demerol   . Fentanyl   . Lexapro [Escitalopram Oxalate]   . Monistat [Miconazole Nitrate-Wipes]   . Prednisone   . Tagamet [Cimetidine]    Follow-up Information   Follow up with Silvano Rusk, MD On 10/04/2014. (2:55m pm --be there 15 minutes early. Bring all meds.)    Specialty:  Gastroenterology   Contact information:   [address] [place] [address] [phone]       Please follow up. (Go to [facility] lab on basement floor the week of Sept 28 for blood work.)       Follow up with [place]. [facility] RN, Physical Therapy and Occupational Therapy)    Contact  information:   [address] [place] [address] [phone]       Follow up with [place]. Schedule an appointment as soon as possible for a visit in 1 week. (to optimize management of CHF)    Contact information:   [place] [address]       Follow up with [facility], Viviann Spare, MD In 2 weeks.   Specialty:  Internal Medicine   Contact information:   [place] [address] [phone]        The results of significant diagnostics from this hospitalization (including imaging, microbiology, ancillary and laboratory) are listed below for reference.    Significant Diagnostic Studies: Ct Chest W Contrast  09/16/2014   CLINICAL DATA:  Right upper quadrant pain, fever  EXAM: CT CHEST, ABDOMEN AND PELVIS WITHOUT CONTRAST  TECHNIQUE: Multidetector CT imaging of the chest, abdomen and pelvis was performed following the standard protocol without IV contrast.  COMPARISON:  None.  FINDINGS: CT CHEST FINDINGS  Increased interstitial lung markings diffusely and bilaterally suggesting chronic lung disease. Mild right lower lobe atelectasis.  Negative for pneumonia. Small bilateral pleural effusions. Negative for mass. 15 mm precarinal node. 12 mm AP window node. Additional smaller mediastinal nodes. Sub carinal 15 mm node.  Cardiac enlargement without pericardial effusion. Coronary artery calcification. Extensive atherosclerotic calcification in the aortic arch and great vessels.  CT ABDOMEN AND PELVIS FINDINGS  Gallbladder is markedly distended with mild gallbladder wall thickening. Large 25 mm partially calcified gallstone. Biliary duct dilatation. Intrahepatic and extrahepatic bile ducts are dilated. Common bile duct measures 13 mm. Multiple rounded filling defects are present in the common bile duct consistent with common duct stones causing obstruction.  Pancreatic atrophy without mass lesion. No focal liver lesion. Spleen is normal.  4 cm  left renal cyst.  No renal obstruction or stone.  Negative for bowel obstruction. Sigmoid diverticulosis. No mass in the abdomen. No free fluid.  Lumbar scoliosis with multilevel spondylosis.  No fracture.  IMPRESSION: Increased lung markings diffusely suggesting chronic lung disease. Mediastinal adenopathy is noted without peripheral lung mass. Small bilateral pleural effusions.  Intrahepatic and extrahepatic biliary ductal dilatation with multiple common duct stones. Gallbladder is dilated  with a 25 mm calcified stone and mild gallbladder wall thickening and hyper enhancement.   Electronically Signed   By: Franchot Gallo M.D.   On: 09/16/2014 07:37   Ct Abdomen Pelvis W Contrast  09/16/2014   CLINICAL DATA:  Right upper quadrant pain, fever  EXAM: CT CHEST, ABDOMEN AND PELVIS WITHOUT CONTRAST  TECHNIQUE: Multidetector CT imaging of the chest, abdomen and pelvis was performed following the standard protocol without IV contrast.  COMPARISON:  None.  FINDINGS: CT CHEST FINDINGS  Increased interstitial lung markings diffusely and bilaterally suggesting chronic lung disease. Mild right lower lobe atelectasis.  Negative for pneumonia. Small bilateral pleural effusions. Negative for mass. 15 mm precarinal node. 12 mm AP window node. Additional smaller mediastinal nodes. Sub carinal 15 mm node.  Cardiac enlargement without pericardial effusion. Coronary artery calcification. Extensive atherosclerotic calcification in the aortic arch and great vessels.  CT ABDOMEN AND PELVIS FINDINGS  Gallbladder is markedly distended with mild gallbladder wall thickening. Large 25 mm partially calcified gallstone. Biliary duct dilatation. Intrahepatic and extrahepatic bile ducts are dilated. Common bile duct measures 13 mm. Multiple rounded filling defects are present in the common bile duct consistent with common duct stones causing obstruction.  Pancreatic atrophy without mass lesion. No focal liver lesion. Spleen is normal.  4 cm  left renal cyst.  No renal obstruction or stone.  Negative for bowel obstruction. Sigmoid diverticulosis. No mass in the abdomen. No free fluid.  Lumbar scoliosis with multilevel spondylosis.  No fracture.  IMPRESSION: Increased lung markings diffusely suggesting chronic lung disease. Mediastinal adenopathy is noted without peripheral lung mass. Small bilateral pleural effusions.  Intrahepatic and extrahepatic biliary ductal dilatation with multiple common duct stones. Gallbladder is dilated with a 25 mm calcified stone and mild gallbladder wall thickening and hyper enhancement.   Electronically Signed   By: Franchot Gallo M.D.   On: 09/16/2014 07:37   Dg Chest Port 1 View  09/20/2014   CLINICAL DATA:  Shortness of breath.  EXAM: PORTABLE CHEST - 1 VIEW  COMPARISON:  09/16/2014.  FINDINGS: Trachea is midline. Heart is mildly enlarged. Mixed interstitial and airspace disease appears mild. Probable small bilateral pleural effusions. Pleural parenchymal scarring at the left apex.  IMPRESSION: Mild congestive heart failure.   Electronically Signed   By: Lorin Picket M.D.   On: 09/20/2014 11:15   Dg Chest Portable 1 View  09/16/2014   CLINICAL DATA:  Shortness of breath.  EXAM: PORTABLE CHEST - 1 VIEW  COMPARISON:  Chest radiograph performed 07/02/2012  FINDINGS: The lungs are well-aerated. Mild vascular congestion is noted, with minimal left basilar opacity, possibly reflecting minimal interstitial edema. There is no evidence of pleural effusion or pneumothorax.  The cardiomediastinal silhouette is mildly enlarged. The patient is status post median sternotomy. No acute osseous abnormalities are seen.  IMPRESSION: Mild vascular congestion and cardiomegaly, with minimal left basilar opacity, possibly reflecting minimal interstitial edema.   Electronically Signed   By: Garald Balding M.D.   On: 09/16/2014 03:00   Dg Ercp With Sphincterotomy  09/17/2014   CLINICAL DATA:  Multiple bile duct stones.  EXAM: ERCP   COMPARISON:  CT the abdomen pelvis - 09/16/2014  FINDINGS: Four spot intraoperative fluoroscopic images of the right upper abdominal quadrant during ERCP are provided for review.  Initial image demonstrates an ERCP probe overlying the right upper abdominal quadrant.  Subsequent images demonstrate selective cannulation and opacification of the CBD which appears dilated.  There are several persistent filling defects  within the central and distal aspects of the CBD compatible with the choledocholithiasis demonstrated on prior abdominal CT. Additionally, there is minimal opacification of the gallbladder with a large intraluminal filling defect compatible with the known large gallstone.  Subsequent images demonstrate insufflation of a sphincterotomy balloon within the distal aspect of the CBD.  Completion image demonstrates a biliary stent overlying distal aspect of the CBD. Several nonocclusive residual filling defects are seen within the central aspect of the proper hepatic duct.  There is minimal opacification of the central aspect of the intrahepatic biliary tree which appears mildly dilated.  IMPRESSION: ERCP with stone retraction and stent placement with residual choledocholithiasis and cholelithiasis as detailed above.   Electronically Signed   By: Sandi Mariscal M.D.   On: 09/17/2014 11:06    Microbiology: Recent Results (from the past 240 hour(s))  URINE CULTURE     Status: None   Collection Time    09/16/14  7:32 AM      Result Value Ref Range Status   Specimen Description URINE, CATHETERIZED   Final   Special Requests NONE   Final   Culture  Setup Time     Final   Value: 09/16/2014 13:15     Performed at [facility]     Final   Value: NO GROWTH     Performed at [facility]   Culture     Final   Value: NO GROWTH     Performed at [facility]   Report Status 09/17/2014 FINAL   Final  CULTURE, BLOOD (ROUTINE X 2)     Status: None   Collection Time     09/16/14  8:45 AM      Result Value Ref Range Status   Specimen Description BLOOD RIGHT HAND   Final   Special Requests BOTTLES DRAWN AEROBIC AND ANAEROBIC 10MLS   Final   Culture  Setup Time     Final   Value: 09/16/2014 14:43     Performed at [facility]   Culture     Final   Value:        BLOOD CULTURE RECEIVED NO GROWTH TO DATE CULTURE WILL BE HELD FOR 5 DAYS BEFORE ISSUING A FINAL NEGATIVE REPORT     Performed at [facility]   Report Status PENDING   Incomplete  CULTURE, BLOOD (ROUTINE X 2)     Status: None   Collection Time    09/16/14  9:00 AM      Result Value Ref Range Status   Specimen Description BLOOD LEFT HAND   Final   Special Requests BOTTLES DRAWN AEROBIC ONLY 10MLS   Final   Culture  Setup Time     Final   Value: 09/16/2014 14:43     Performed at [facility]   Culture     Final   Value:        BLOOD CULTURE RECEIVED NO GROWTH TO DATE CULTURE WILL BE HELD FOR 5 DAYS BEFORE ISSUING A FINAL NEGATIVE REPORT     Performed at [facility]   Report Status PENDING   Incomplete  MRSA PCR SCREENING     Status: None   Collection Time    09/16/14  2:39 PM      Result Value Ref Range Status   MRSA by PCR NEGATIVE  NEGATIVE Final   Comment:            The GeneXpert MRSA  Assay (FDA     approved for NASAL specimens     only), is one component of a     comprehensive MRSA colonization     surveillance program. It is not     intended to diagnose MRSA     infection nor to guide or     monitor treatment for     MRSA infections.  CLOSTRIDIUM DIFFICILE BY PCR     Status: None   Collection Time    09/21/14 10:50 AM      Result Value Ref Range Status   C difficile by pcr NEGATIVE  NEGATIVE Final     Labs: Basic Metabolic Panel:  Recent Labs Lab 09/18/14 0305 09/19/14 0240 09/20/14 1157 09/20/14 1520 09/21/14 0817  NA 140 139 138 137 140  K 3.4* 3.2* 3.2* 3.5* 3.4*  CL 103 103 100 97 100  CO2 20 21 20 22 25   GLUCOSE 95 99 144* 139*  118*  BUN 24* 22 14 13 12   CREATININE 1.00 0.79 0.64 0.65 0.72  CALCIUM 8.9 8.8 9.2 9.4 9.1   Liver Function Tests:  Recent Labs Lab 09/17/14 0256 09/18/14 0305 09/19/14 0240 09/20/14 1157 09/21/14 0817  AST 129* 92* 54* 39* 28  ALT 158* 130* 97* 80* 58*  ALKPHOS 245* 228* 193* 209* 170*  BILITOT 4.0* 1.9* 1.6* 1.3* 1.4*  PROT 5.9* 5.9* 5.7* 6.8 6.2  ALBUMIN 3.1* 2.9* 2.8* 3.3* 3.1*    Recent Labs Lab 09/16/14 0900 09/18/14 0305 09/19/14 0240 09/20/14 1157  LIPASE 11 102* 98* 38  AMYLASE  --  105  --   --    CBC:  Recent Labs Lab 09/16/14 0220 09/17/14 0256 09/18/14 0305 09/19/14 0240 09/20/14 1157 09/21/14 0817  WBC 13.9* 13.0* 9.4 9.1 11.7* 8.7  NEUTROABS 11.9*  --   --   --   --   --   HGB 11.4* 10.5* 10.5* 10.5* 11.9* 10.6*  HCT 34.8* 32.0* 31.8* 31.4* 36.0 32.5*  MCV 88.5 90.1 90.3 89.5 88.2 88.6  PLT 193 142* 142* 149* 194 176   Cardiac Enzymes:  Recent Labs Lab 09/16/14 0900 09/16/14 1535 09/16/14 2100  TROPONINI <0.30 <0.30 <0.30    Signed:  Rockwell Germany, PA-S2  Imogene Burn, PA-C [phone] [facility] 09/21/2014, 3:20 PM  Patient was seen, examined,treatment plan was discussed with the Physician extender. I have directly reviewed the clinical findings, lab, imaging studies and management of this patient in detail. I have made the necessary changes to the above noted documentation, and agree with the documentation, as recorded by the Physician extender.  Phillips Climes MD [facility].

## 2014-09-21 NOTE — Discharge Instructions (Signed)
Take antibiotics as prescribed for another 6 days.  Follow up with [facility] to discuss management of stones in your common bile duct.    Follow up with [facility] to manage chronic heart failure and shortness of breath.

## 2014-09-22 DIAGNOSIS — I509 Heart failure, unspecified: Secondary | ICD-10-CM

## 2014-09-22 DIAGNOSIS — Z48815 Encounter for surgical aftercare following surgery on the digestive system: Secondary | ICD-10-CM

## 2014-09-22 DIAGNOSIS — Z48814 Encounter for surgical aftercare following surgery on the teeth or oral cavity: Secondary | ICD-10-CM

## 2014-09-22 DIAGNOSIS — J449 Chronic obstructive pulmonary disease, unspecified: Secondary | ICD-10-CM

## 2014-09-22 DIAGNOSIS — I4891 Unspecified atrial fibrillation: Secondary | ICD-10-CM

## 2014-09-22 LAB — CULTURE, BLOOD (ROUTINE X 2)
CULTURE: NO GROWTH
Culture: NO GROWTH

## 2014-09-23 ENCOUNTER — Ambulatory Visit (INDEPENDENT_AMBULATORY_CARE_PROVIDER_SITE_OTHER): Payer: Medicare Other | Admitting: Nurse Practitioner

## 2014-09-23 ENCOUNTER — Encounter: Payer: Self-pay | Admitting: Nurse Practitioner

## 2014-09-23 VITALS — BP 128/80 | HR 82 | Temp 97.9°F | Resp 18 | Ht 61.0 in | Wt 110.0 lb

## 2014-09-23 DIAGNOSIS — I10 Essential (primary) hypertension: Secondary | ICD-10-CM

## 2014-09-23 DIAGNOSIS — K5900 Constipation, unspecified: Secondary | ICD-10-CM

## 2014-09-23 DIAGNOSIS — K8309 Other cholangitis: Secondary | ICD-10-CM

## 2014-09-23 DIAGNOSIS — D649 Anemia, unspecified: Secondary | ICD-10-CM

## 2014-09-23 MED ORDER — FERROUS SULFATE 325 (65 FE) MG PO TABS: 325.0000 mg | ORAL_TABLET | Freq: Every day | ORAL | Status: AC

## 2014-09-23 MED ORDER — DOCUSATE SODIUM 100 MG PO CAPS: 100.0000 mg | ORAL_CAPSULE | Freq: Two times a day (BID) | ORAL | Status: DC

## 2014-09-23 NOTE — Progress Notes (Signed)
Patient ID: Vicki Velasquez, female   DOB: [birth year], 80 y.o.   MRN: [id number]  Allergies  Allergen Reactions  . Accupril [Quinapril Hcl] Other (See Comments)    unknown  . Cephalexin     diarrhea  . Clarithromycin   . Codeine   . Demerol   . Fentanyl   . Lexapro [Escitalopram Oxalate]   . Monistat [Miconazole Nitrate-Wipes]   . Prednisone   . Tagamet [Cimetidine]     Chief Complaint  Patient presents with  . Hospitalization Follow-up    HPI: Patient is a 80 y.o.  female seen in the office today for hospitalization follow up. Her caregiver has several mediation related questions. Her niece and caregiver accompany to the appointment. Following hospitalization, her hctz was discontinued, asa was reduced from 324 to 81 mg. Losartan was continued. New medications include Iron 325 mg which she has not started taking yet. Pt has scheduled follow up with GI for 10/04/14. F/u with cardiology needs to be scheduled.  Pt without complaint today. No abdominal pain, n, v, diarrhea. Per caregiver pt with constipation, ongoing. Not routinely taking Senokot or Senokot-S. Per caregiver drinks about 5 glasses of liquid per day.   Review of Systems:  Review of Systems  Constitutional: Negative for fever and chills.  HENT: Negative for ear pain, hearing loss and tinnitus.   Eyes: Positive for double vision (always sees double vision). Negative for blurred vision and pain.  Respiratory: Negative for shortness of breath.   Cardiovascular: Negative for chest pain and palpitations.  Gastrointestinal: Positive for constipation. Negative for heartburn, abdominal pain and diarrhea.  Genitourinary: Negative for dysuria, urgency and frequency.  Skin: Negative.   Neurological: Negative for dizziness, tremors, sensory change, speech change, focal weakness, loss of consciousness and headaches.  Psychiatric/Behavioral: The patient is not nervous/anxious and does not have insomnia.      Past Medical History   Diagnosis Date  . Atrial flutter   . Hypothyroidism   . Hypertension   . Edema     Leg  . Atrial fibrillation     not on coumadin-managed on meciaitons.  . SOB (shortness of breath) 12/16/10    At rest-no oxygen at rest  . Chronic lung disease     Stable  . Cardiomegaly     Stable  . Disc disease, degenerative, lumbar or lumbosacral     Dr. Marcha [facility]-Last Steroids 5 yr ago  . Mixed hearing loss, bilateral   . Ingrowing nail 03/04/2013  . Congestive heart failure, unspecified   . Impacted cerumen 05/13/2012  . Other malaise and fatigue   . Pressure ulcer, heel(707.07)   . Hypopotassemia   . Unspecified constipation   . Hypoxemia   . Other abnormal blood chemistry   . Open fracture of unspecified part of neck of femur   . Other chronic pain   . Atherosclerosis of native arteries of the extremities, unspecified   . Scoliosis (and kyphoscoliosis), idiopathic   . Nausea alone   . Urinary frequency   . Proteinuria   . Senile osteoporosis   . Unspecified vitamin D deficiency   . Herpes zoster with other nervous system complications(053.19)   . Depressive disorder, not elsewhere classified   . Cervicalgia   . Dysphagia, unspecified(787.20)   . Sciatica   . Anemia, unspecified   . Lumbago   . Allergic rhinitis due to pollen   . Abnormality of gait   . Neoplasm of uncertain behavior of skin   . Esophageal  reflux   . Mononeuritis of unspecified site   . Diaphragmatic hernia without mention of obstruction or gangrene   . Pain in joint, pelvic region and thigh   . Other and unspecified hyperlipidemia   . Encounter for long-term (current) use of other medications   . Osteoarthrosis, unspecified whether generalized or localized, lower leg   . Other left bundle branch block   . Other heart block   . Dizziness and giddiness   . Undiagnosed cardiac murmurs   . Insomnia, unspecified   . Neuralgia, postherpetic    Past Surgical History  Procedure Laterality Date   . Aortic valve replacement      2002 replaced with a pig vavle.    . Hip arthroplasty  01/28/2012    Procedure: ARTHROPLASTY BIPOLAR HIP;  Surgeon: Johnn Hai, MD;  Location: [facility];  Service: Orthopedics;  Laterality: Left;  . Partial hysterectomy  1961  . Appendectomy  1961  . Dilation and curettage of uterus  1959  . Resection right parietal meningioma  08/19/1990  . Cardiac catherization  10/1999    dr tysinger  . Aortic valve replacement with a porcine valve      DR HENDRICKSON  . Arthroscopy left knee      DR Eulas Post   . Cataract surgery right eye      DR EPPS  . Orif of fracture (l) femur    . Colonoscopy  04/05/2004    Diverticulosis, Dr.Medoff  . Esophagogastroduodenoscopy  10/03/2008  . Myelogram  10/01/2008    Cervical   . Ercp N/A 09/17/2014    Procedure: ENDOSCOPIC RETROGRADE CHOLANGIOPANCREATOGRAPHY (ERCP);  Surgeon: Gatha Mayer, MD;  Location: [facility] ENDOS[facility];  Service: Endoscopy;  Laterality: N/A;   Social History:   reports that she has never smoked. She does not have any smokeless tobacco history on file. She reports that she does not drink alcohol or use illicit drugs.  Family History  Problem Relation Age of Onset  . Stroke Mother   . Cancer Mother     LUNG  . Stroke Father   . Cancer Sister     BREAST,LUNG,LIVER  . Cancer Sister     LYMPHOMA    Medications: Patient's Medications  New Prescriptions   DOCUSATE SODIUM (COLACE) 100 MG CAPSULE    Take 1 capsule (100 mg total) by mouth 2 (two) times daily.  Previous Medications   ASCORBIC ACID (VITAMIN C) 1000 MG TABLET    Take 1,000 mg by mouth daily.   ASPIRIN EC 81 MG TABLET    Take 81 mg by mouth daily.   CIPROFLOXACIN (CIPRO) 500 MG TABLET    Take 1 tablet (500 mg total) by mouth 2 (two) times daily.   DULOXETINE (CYMBALTA) 30 MG CAPSULE    Take 30 mg by mouth daily.   FUROSEMIDE (LASIX) 20 MG TABLET    Take 1 tablet (20 mg total) by mouth daily.   HYDROCORTISONE (PROCTOSOL HC) 2.5 % RECTAL  CREAM    Place 1 application rectally 2 (two) times daily as needed for hemorrhoids or itching. Patient denies any serious reaction to steroids.   IBUPROFEN (ADVIL) 200 MG TABLET    Take 200 mg by mouth every 6 (six) hours as needed for pain.   LEVOTHYROXINE (SYNTHROID, LEVOTHROID) 75 MCG TABLET    Take 75 mcg by mouth daily before breakfast.   LOSARTAN (COZAAR) 100 MG TABLET    Take 1 tablet (100 mg total) by mouth daily.   METOPROLOL  SUCCINATE (TOPROL-XL) 50 MG 24 HR TABLET    Take 50 mg by mouth daily. Take with or immediately following a meal.   METRONIDAZOLE (FLAGYL) 500 MG TABLET    Take 1 tablet (500 mg total) by mouth 3 (three) times daily.   MULTIPLE VITAMIN (MULITIVITAMIN WITH MINERALS) TABS    Take 1 tablet by mouth daily.   OMEPRAZOLE (PRILOSEC) 20 MG CAPSULE    Take 20 mg by mouth 2 (two) times daily before a meal.   OXYCODONE (OXY IR/ROXICODONE) 5 MG IMMEDIATE RELEASE TABLET    Take 5 mg by mouth every 6 (six) hours.   POTASSIUM CHLORIDE (KLOR-CON 10) 10 MEQ TABLET    Take 10 mEq by mouth daily.  Modified Medications   Modified Medication Previous Medication   FERROUS SULFATE 325 (65 FE) MG TABLET ferrous sulfate 325 (65 FE) MG tablet      Take 1 tablet (325 mg total) by mouth daily with breakfast.    Take 1 tablet (325 mg total) by mouth daily with breakfast.  Discontinued Medications   ASPIRIN 325 MG TABLET    Take 325 mg by mouth daily.   LOSARTAN-HYDROCHLOROTHIAZIDE (HYZAAR) 100-25 MG PER TABLET       SENNA (SENOKOT) 8.6 MG TABLET    Take 1 tablet by mouth.   SENNOSIDES-DOCUSATE SODIUM (SENOKOT-S) 8.6-50 MG TABLET    Take 1 tablet by mouth as needed for constipation.    TDAP (BOOSTRIX) 5-2.5-18.5 LF-MCG/0.5 INJECTION    Inject 0.5 mLs into the muscle once.   TROLAMINE SALICYLATE (ASPERCREME/ALOE) 10 % CREAM    Apply 1 application topically as needed for muscle pain. Apply to painful elbow 4 times daily     Physical Exam:  Filed Vitals:   09/23/14 1358  BP: 128/80   Pulse: 82  Temp: 97.9 F (36.6 C)  TempSrc: Oral  Resp: 18  Height: 5\' 1"  (1.549 m)  Weight: 110 lb (49.896 kg)  SpO2: 95%    Physical Exam  Constitutional: She is well-developed, well-nourished, and in no distress. No distress.  HENT:  Head: Normocephalic and atraumatic.  Right Ear: External ear normal.  Left Ear: External ear normal.  Nose: Nose normal.  Mouth/Throat: Oropharynx is clear and moist. No oropharyngeal exudate.  HOH  Eyes: Conjunctivae and EOM are normal. Pupils are equal, round, and reactive to light.  Neck: Normal range of motion. Neck supple. No thyromegaly present.  Cardiovascular: Normal rate, regular rhythm and normal heart sounds.   Pulmonary/Chest: Effort normal and breath sounds normal. No respiratory distress. She has no wheezes.  Abdominal: Soft. Bowel sounds are normal. She exhibits no mass. There is no tenderness. There is no guarding.  Lymphadenopathy:    She has no cervical adenopathy.  Neurological: She is alert.  Wheelchair bound; generalized weakness; grips equal bilaterally   Skin: Skin is warm and dry. She is not diaphoretic.  Psychiatric: Affect normal.    Labs reviewed: Basic Metabolic Panel:  Recent Labs  03/08/14 0857  07/05/14 0849  09/20/14 1157 09/20/14 1520 09/21/14 0817  NA 139  < > 141  < > 138 137 140  K 4.1  < > 4.1  < > 3.2* 3.5* 3.4*  CL 98  < > 99  < > 100 97 100  CO2 24  < > 25  < > 20 22 25   GLUCOSE 97  < > 87  < > 144* 139* 118*  BUN 19  < > 22  < > 14 13 12  CREATININE 0.89  < > 0.95  < > 0.64 0.65 0.72  CALCIUM 9.5  < > 9.6  < > 9.2 9.4 9.1  TSH 1.820  --  1.710  --   --   --   --   < > = values in this interval not displayed. Liver Function Tests:  Recent Labs  09/19/14 0240 09/20/14 1157 09/21/14 0817  AST 54* 39* 28  ALT 97* 80* 58*  ALKPHOS 193* 209* 170*  BILITOT 1.6* 1.3* 1.4*  PROT 5.7* 6.8 6.2  ALBUMIN 2.8* 3.3* 3.1*    Recent Labs  09/18/14 0305 09/19/14 0240 09/20/14 1157   LIPASE 102* 98* 38  AMYLASE 105  --   --    No results found for this basename: AMMONIA,  in the last 8760 hours CBC:  Recent Labs  06/11/14 1230 07/05/14 0849 09/16/14 0220  09/19/14 0240 09/20/14 1157 09/21/14 0817  WBC 10.0 6.8 13.9*  < > 9.1 11.7* 8.7  NEUTROABS 7.8* 3.8 11.9*  --   --   --   --   HGB 11.1* 10.8* 11.4*  < > 10.5* 11.9* 10.6*  HCT 33.8* 34.3 34.8*  < > 31.4* 36.0 32.5*  MCV 92.9 90 88.5  < > 89.5 88.2 88.6  PLT 194 208 193  < > 149* 194 176  < > = values in this interval not displayed.  Assessment/Plan  1. Anemia, unspecified anemia type - ferrous sulfate 325 (65 FE) MG tablet; Take 1 tablet (325 mg total) by mouth daily with breakfast. - CBC, future  2. Unspecified constipation - increase po fluids, eight 8 oz glasses of water recommended - docusate sodium (COLACE) 100 MG capsule; Take 1 capsule (100 mg total) by mouth 2 (two) times daily.  Dispense: 10 capsule; Refill: 0  3. Essential hypertension -stable -continue Losartin and Metoprolol  5. Cholangitis -Stable -continue with flagyl and cipro until complete - to make GI follow up appt    Return for labs in 2 weeks

## 2014-09-23 NOTE — Patient Instructions (Addendum)
Vicki Velasquez - call to request follow up appointment for CHF  [address], [place], [place] [phone]) [phone]   Colace 100 mg daily scheduled for stool softener to help with constipation  ASA 81 mg daily  Iron daily with meals  TAKE the Losartan potassium (this is a blood pressure medication) Do not take Losartan-hctz   TAKE potassium 20 meq and lasix 10 mg daily as prescribed   Come back for lab work in 2 weeks- if you would like to nake a return appt to discuss them you can make an another appt otherwise we will call you with results

## 2014-09-27 ENCOUNTER — Telehealth: Payer: Self-pay | Admitting: *Deleted

## 2014-09-27 NOTE — Telephone Encounter (Signed)
Having bowel movements and states that the iron tablet is causing this. She is on her 4th bowel movement today. The bowel movement is not runny it is soft. Patient is also taking a stool softner, should she just take her off of this or should the iron tablet be changed to. Please Advise.

## 2014-10-04 ENCOUNTER — Encounter: Payer: Self-pay | Admitting: Internal Medicine

## 2014-10-04 ENCOUNTER — Ambulatory Visit (INDEPENDENT_AMBULATORY_CARE_PROVIDER_SITE_OTHER): Payer: Medicare Other | Admitting: Internal Medicine

## 2014-10-04 ENCOUNTER — Other Ambulatory Visit (INDEPENDENT_AMBULATORY_CARE_PROVIDER_SITE_OTHER): Payer: Medicare Other

## 2014-10-04 VITALS — BP 108/60 | HR 56 | Ht 61.0 in | Wt 132.0 lb

## 2014-10-04 DIAGNOSIS — K8051 Calculus of bile duct without cholangitis or cholecystitis with obstruction: Secondary | ICD-10-CM

## 2014-10-04 LAB — HEPATIC FUNCTION PANEL
ALT: 16 U/L (ref 0–35)
AST: 25 U/L (ref 0–37)
Albumin: 3.4 g/dL — ABNORMAL LOW (ref 3.5–5.2)
Alkaline Phosphatase: 111 U/L (ref 39–117)
BILIRUBIN TOTAL: 0.7 mg/dL (ref 0.2–1.2)
Bilirubin, Direct: 0.2 mg/dL (ref 0.0–0.3)
Total Protein: 6.9 g/dL (ref 6.0–8.3)

## 2014-10-04 NOTE — Patient Instructions (Signed)
Your physician has requested that you go to the basement for the following lab work before leaving today: LFT's   I appreciate the opportunity to care for you.

## 2014-10-04 NOTE — Progress Notes (Signed)
HPI: Pt is a 80 yo female who underwent and ERCP with stent placement on 09/17/14. She is here for follow up and offers no complaints. No N/V/D. No abd pain. No fever, chills, or night sweats. No BRBPR or melena.   Current Medications, Allergies, Past Medical History, Past Surgical History, Family History and Social History were reviewed in [employer] record.    Physical Exam: General: Pleasant, well developed , well nourished, elderlyfemale in no acute distress Head: Normocephalic and atraumatic Eyes:  sclerae anicteric, conjunctiva pink  Ears: Normal auditory acuity Lungs: Clear throughout to auscultation Heart: Regular rate and rhythm Abdomen: Soft, non distended, non-tender. No masses, no hepatomegaly. Normal bowel sounds Rectal: deferred Musculoskeletal: Symmetrical with no gross deformities  Extremities: No edema  Neurological: Alert oriented x 4, grossly nonfocal Psychological:  Alert and cooperative. Normal mood and affect  Assessment and Recommendations: Choledocholithiasis with obstruction Improved - needs LFT's and will determine next step - probable cholangioscopy with contact lithotripsy    I have seen the pateint with Ms Lonie Newsham who served as a Education administrator.  QG:BEEFE, Viviann Spare, MD

## 2014-10-04 NOTE — Assessment & Plan Note (Signed)
Improved - needs LFT's and will determine next step - probable cholangioscopy with contact lithotripsy

## 2014-10-07 ENCOUNTER — Encounter: Payer: Self-pay | Admitting: Nurse Practitioner

## 2014-10-07 ENCOUNTER — Other Ambulatory Visit: Payer: Medicare Other

## 2014-10-07 ENCOUNTER — Encounter: Payer: Medicare Other | Admitting: Nurse Practitioner

## 2014-10-07 DIAGNOSIS — D649 Anemia, unspecified: Secondary | ICD-10-CM

## 2014-10-07 DIAGNOSIS — I10 Essential (primary) hypertension: Secondary | ICD-10-CM

## 2014-10-07 DIAGNOSIS — E039 Hypothyroidism, unspecified: Secondary | ICD-10-CM

## 2014-10-08 LAB — COMPREHENSIVE METABOLIC PANEL
A/G RATIO: 1.7 (ref 1.1–2.5)
ALK PHOS: 115 IU/L (ref 39–117)
ALT: 12 IU/L (ref 0–32)
AST: 23 IU/L (ref 0–40)
Albumin: 3.8 g/dL (ref 3.2–4.6)
BUN / CREAT RATIO: 18 (ref 11–26)
BUN: 14 mg/dL (ref 10–36)
CO2: 26 mmol/L (ref 18–29)
Calcium: 9.6 mg/dL (ref 8.7–10.3)
Chloride: 99 mmol/L (ref 97–108)
Creatinine, Ser: 0.8 mg/dL (ref 0.57–1.00)
GFR calc Af Amer: 72 mL/min/{1.73_m2} (ref >59)
GFR, EST NON AFRICAN AMERICAN: 63 mL/min/{1.73_m2} (ref >59)
GLOBULIN, TOTAL: 2.2 g/dL (ref 1.5–4.5)
Glucose: 103 mg/dL — ABNORMAL HIGH (ref 65–99)
Potassium: 4.2 mmol/L (ref 3.5–5.2)
Sodium: 141 mmol/L (ref 134–144)
Total Bilirubin: 0.6 mg/dL (ref 0.0–1.2)
Total Protein: 6 g/dL (ref 6.0–8.5)

## 2014-10-08 LAB — CBC WITH DIFFERENTIAL
Basophils Absolute: 0 10*3/uL (ref 0.0–0.2)
Basos: 0 %
EOS: 1 %
Eosinophils Absolute: 0.1 10*3/uL (ref 0.0–0.4)
HCT: 31.3 % — ABNORMAL LOW (ref 34.0–46.6)
HEMOGLOBIN: 9.9 g/dL — AB (ref 11.1–15.9)
IMMATURE GRANS (ABS): 0 10*3/uL (ref 0.0–0.1)
Immature Granulocytes: 0 %
LYMPHS ABS: 2.3 10*3/uL (ref 0.7–3.1)
Lymphs: 24 %
MCH: 29.5 pg (ref 26.6–33.0)
MCHC: 31.6 g/dL (ref 31.5–35.7)
MCV: 93 fL (ref 79–97)
MONOCYTES: 9 %
Monocytes Absolute: 0.8 10*3/uL (ref 0.1–0.9)
Neutrophils Absolute: 6.2 10*3/uL (ref 1.4–7.0)
Neutrophils Relative %: 66 %
Platelets: 248 10*3/uL (ref 150–379)
RBC: 3.36 x10E6/uL — AB (ref 3.77–5.28)
RDW: 14.9 % (ref 12.3–15.4)
WBC: 9.4 10*3/uL (ref 3.4–10.8)

## 2014-10-08 LAB — RETICULOCYTES: Retic Ct Pct: 1.2 % (ref 0.6–2.6)

## 2014-10-08 LAB — TSH: TSH: 0.691 u[IU]/mL (ref 0.450–4.500)

## 2014-10-11 ENCOUNTER — Other Ambulatory Visit: Payer: Self-pay

## 2014-10-11 DIAGNOSIS — E611 Iron deficiency: Secondary | ICD-10-CM

## 2014-10-11 NOTE — Progress Notes (Signed)
This encounter was created in error - please disregard.

## 2014-10-12 NOTE — Progress Notes (Signed)
Quick Note:  Liver tests are normal except for slight rise in one He should reduce alcohol as we discussed and keep f/u PCP No other testing now but LFT's should be measured every quarter ______

## 2014-10-14 ENCOUNTER — Telehealth: Payer: Self-pay

## 2014-10-14 NOTE — Progress Notes (Signed)
Quick Note:  Prior notes erroneous - is being set up for ERCP and stent removal/stone removal at [facility] ______

## 2014-10-14 NOTE — Telephone Encounter (Signed)
Message copied by Marlon Pel on Thu Oct 14, 2014 10:28 AM ------      Message from: Silvano Rusk E      Created: Wed Oct 13, 2014  3:05 PM      Regarding: RE: ? for [facility]       Please let her son know that I recommend she go there for evaluation and treatment of CBD stones - make referral                  ----- Message -----         From: Kellie Moor, RN         Sent: 10/13/2014  10:58 AM           To: Gatha Mayer, MD      Subject: RE: ? for [facility]                                        They use contact lithotripsy      ----- Message -----         From: Gatha Mayer, MD         Sent: 10/12/2014   4:03 PM           To: Kellie Moor, RN      Subject: ? for [facility]                                            Please ask the [facility] GI department if the ERCP docs do contact or laser lithotripsy for bile duct stones             ------

## 2014-10-14 NOTE — Telephone Encounter (Signed)
Son notified.  Referral faxed to [facility].  He is aware that he will be contacted directly by [facility] to schedule an ERCP

## 2014-10-25 ENCOUNTER — Other Ambulatory Visit: Payer: Medicare Other

## 2014-10-25 DIAGNOSIS — E611 Iron deficiency: Secondary | ICD-10-CM

## 2014-10-25 NOTE — Telephone Encounter (Signed)
Patient is scheduled for ERCP at [facility] for 11/01/14.  Appt was scheduled with the patient

## 2014-10-26 ENCOUNTER — Other Ambulatory Visit: Payer: Self-pay | Admitting: Internal Medicine

## 2014-10-26 ENCOUNTER — Encounter: Payer: Self-pay | Admitting: *Deleted

## 2014-10-26 LAB — CBC WITH DIFFERENTIAL/PLATELET
Basophils Absolute: 0 x10E3/uL (ref 0.0–0.2)
Basos: 1 %
Eos: 3 %
Eosinophils Absolute: 0.2 x10E3/uL (ref 0.0–0.4)
HCT: 33.5 % — ABNORMAL LOW (ref 34.0–46.6)
Hemoglobin: 10.9 g/dL — ABNORMAL LOW (ref 11.1–15.9)
Immature Grans (Abs): 0 x10E3/uL (ref 0.0–0.1)
Immature Granulocytes: 0 %
Lymphocytes Absolute: 2.1 x10E3/uL (ref 0.7–3.1)
Lymphs: 29 %
MCH: 29.1 pg (ref 26.6–33.0)
MCHC: 32.5 g/dL (ref 31.5–35.7)
MCV: 90 fL (ref 79–97)
Monocytes Absolute: 0.5 x10E3/uL (ref 0.1–0.9)
Monocytes: 7 %
Neutrophils Absolute: 4.5 x10E3/uL (ref 1.4–7.0)
Neutrophils Relative %: 60 %
RBC: 3.74 x10E6/uL — ABNORMAL LOW (ref 3.77–5.28)
RDW: 14.9 % (ref 12.3–15.4)
WBC: 7.4 x10E3/uL (ref 3.4–10.8)

## 2014-11-01 ENCOUNTER — Other Ambulatory Visit: Payer: Self-pay | Admitting: Internal Medicine

## 2014-11-08 ENCOUNTER — Encounter: Payer: Self-pay | Admitting: *Deleted

## 2014-11-08 ENCOUNTER — Other Ambulatory Visit: Payer: Medicare Other

## 2014-11-08 NOTE — Telephone Encounter (Signed)
error 

## 2014-11-10 ENCOUNTER — Ambulatory Visit (INDEPENDENT_AMBULATORY_CARE_PROVIDER_SITE_OTHER): Payer: Medicare Other | Admitting: Internal Medicine

## 2014-11-10 ENCOUNTER — Encounter: Payer: Self-pay | Admitting: Internal Medicine

## 2014-11-10 VITALS — BP 140/80 | HR 71 | Temp 97.9°F | Ht 61.0 in | Wt 130.8 lb

## 2014-11-10 DIAGNOSIS — I509 Heart failure, unspecified: Secondary | ICD-10-CM | POA: Insufficient documentation

## 2014-11-10 DIAGNOSIS — H9190 Unspecified hearing loss, unspecified ear: Secondary | ICD-10-CM | POA: Insufficient documentation

## 2014-11-10 DIAGNOSIS — G8929 Other chronic pain: Secondary | ICD-10-CM | POA: Insufficient documentation

## 2014-11-10 DIAGNOSIS — L97421 Non-pressure chronic ulcer of left heel and midfoot limited to breakdown of skin: Secondary | ICD-10-CM

## 2014-11-10 DIAGNOSIS — I5032 Chronic diastolic (congestive) heart failure: Secondary | ICD-10-CM

## 2014-11-10 DIAGNOSIS — R609 Edema, unspecified: Secondary | ICD-10-CM

## 2014-11-10 DIAGNOSIS — I517 Cardiomegaly: Secondary | ICD-10-CM | POA: Insufficient documentation

## 2014-11-10 DIAGNOSIS — D649 Anemia, unspecified: Secondary | ICD-10-CM

## 2014-11-10 DIAGNOSIS — E559 Vitamin D deficiency, unspecified: Secondary | ICD-10-CM | POA: Insufficient documentation

## 2014-11-10 DIAGNOSIS — I272 Pulmonary hypertension, unspecified: Secondary | ICD-10-CM | POA: Insufficient documentation

## 2014-11-10 DIAGNOSIS — G4700 Insomnia, unspecified: Secondary | ICD-10-CM

## 2014-11-10 DIAGNOSIS — K8020 Calculus of gallbladder without cholecystitis without obstruction: Secondary | ICD-10-CM

## 2014-11-10 DIAGNOSIS — I10 Essential (primary) hypertension: Secondary | ICD-10-CM

## 2014-11-10 DIAGNOSIS — E039 Hypothyroidism, unspecified: Secondary | ICD-10-CM

## 2014-11-10 DIAGNOSIS — E876 Hypokalemia: Secondary | ICD-10-CM

## 2014-11-10 NOTE — Patient Instructions (Signed)
Take the iron tablet. Use stool softener if stools are firm and you have to strain. The furosemide will help the edema.

## 2014-11-10 NOTE — Progress Notes (Signed)
Patient ID: Quitman Livings, female   DOB: 04-Oct-1916, 80 y.o.   MRN: [id number]    Facility  [facility]    Place of Service:   OFFICE   Allergies  Allergen Reactions  . Accupril [Quinapril Hcl] Other (See Comments)    unknown  . Cephalexin     diarrhea  . Clarithromycin   . Codeine   . Demerol   . Fentanyl   . Lexapro [Escitalopram Oxalate]   . Monistat [Miconazole Nitrate-Wipes]   . Prednisone   . Tagamet [Cimetidine]     Chief Complaint  Patient presents with  . Follow-up    4 month Follow Up  . Immunizations    Refused pneumonia vaccine     HPI:  Refusing stool softener and furosemide at times.   Ulcer of heel, left, limited to breakdown of skin: continues on doxycycline. Area has improved  Anemia, unspecified anemia type: improving  Chronic diastolic congestive heart failure: compensaated  Edema: unchanged. 2+ both ankles.  Gallstone: on 11/01/14 had metal stent placed during ERCP at [facility]. Stones could not be completely removed.  Essential hypertension: controlled  Hypokalemia: recheck in future  Hypothyroidism, unspecified hypothyroidism type: unchanged  Insomnia: improved. Sleeps through the noight    Medications: Patient's Medications  New Prescriptions   No medications on file  Previous Medications   ASCORBIC ACID (VITAMIN C) 1000 MG TABLET    Take 1,000 mg by mouth daily.   ASPIRIN EC 81 MG TABLET    Take 81 mg by mouth daily.   DOCUSATE SODIUM (COLACE) 100 MG CAPSULE    Take 1 capsule (100 mg total) by mouth 2 (two) times daily.   DOXYCYCLINE (VIBRAMYCIN) 100 MG CAPSULE    Take 100 mg by mouth 2 (two) times daily.   DULOXETINE (CYMBALTA) 30 MG CAPSULE    Take 30 mg by mouth daily.   FERROUS SULFATE 325 (65 FE) MG TABLET    Take 1 tablet (325 mg total) by mouth daily with breakfast.   FUROSEMIDE (LASIX) 20 MG TABLET    Take 1 tablet (20 mg total) by mouth daily.   HYDROCORTISONE (PROCTOSOL HC) 2.5 % RECTAL CREAM    Place 1 application  rectally 2 (two) times daily as needed for hemorrhoids or itching. Patient denies any serious reaction to steroids.   IBUPROFEN (ADVIL) 200 MG TABLET    Take 200 mg by mouth every 6 (six) hours as needed for pain.   KLOR-CON M10 10 MEQ TABLET       LEVOTHYROXINE (SYNTHROID, LEVOTHROID) 75 MCG TABLET    Take 75 mcg by mouth daily before breakfast.   LEVOTHYROXINE (SYNTHROID, LEVOTHROID) 75 MCG TABLET    TAKE 1 TABLET BY MOUTH EVERY DAY   LOSARTAN (COZAAR) 100 MG TABLET    TAKE 1 TABLET BY MOUTH EVERY DAY   LOSARTAN-HYDROCHLOROTHIAZIDE (HYZAAR) 100-25 MG PER TABLET       METOPROLOL SUCCINATE (TOPROL-XL) 50 MG 24 HR TABLET    Take 50 mg by mouth daily. Take with or immediately following a meal.   MULTIPLE VITAMIN (MULITIVITAMIN WITH MINERALS) TABS    Take 1 tablet by mouth daily.   OMEPRAZOLE (PRILOSEC) 20 MG CAPSULE    Take 20 mg by mouth 2 (two) times daily before a meal.   OXYCODONE (OXY IR/ROXICODONE) 5 MG IMMEDIATE RELEASE TABLET    Take 5 mg by mouth every 6 (six) hours.   POTASSIUM CHLORIDE (KLOR-CON 10) 10 MEQ TABLET    Take 10 mEq  by mouth daily.  Modified Medications   No medications on file  Discontinued Medications   No medications on file     Review of Systems  Constitutional: Positive for activity change and fatigue. Negative for fever, chills, appetite change and unexpected weight change.  HENT: Positive for hearing loss and tinnitus. Negative for congestion and ear pain.   Eyes: Negative.   Respiratory: Negative.   Cardiovascular: Negative.   Gastrointestinal: Negative for diarrhea, constipation and abdominal distention.       Has metal stent in biliary duct due to multiple stones.  Genitourinary:       Urinary leakage.  Musculoskeletal: Negative for neck pain and neck stiffness.       Chronic, debilitating back pains which severely affects her mobility. There is generalized muscle weakness particularly below the waist. She has joint pains in the hips and knees. There is  some restriction of motion at the knees and perhaps the hips as well. She is status post fracture left femur at the hip on 01/26/13. Residual pain is still present. She is very unsteady on standing.  Skin: Positive for rash.       Scarring from prior left heel ucer. Cellulitis left foot has improved.  Neurological: Positive for weakness. Negative for tremors, seizures, syncope and speech difficulty.       Difficulty with balance. Unable to walk.  Hematological: Negative.   Psychiatric/Behavioral: Positive for sleep disturbance.       Early morning awakening.    Filed Vitals:   11/10/14 1147  BP: 140/80  Pulse: 71  Temp: 97.9 F (36.6 C)  TempSrc: Oral  Height: 5\' 1"  (1.549 m)  Weight: 130 lb 12.8 oz (59.33 kg)  SpO2: 96%   Body mass index is 24.73 kg/(m^2).  Physical Exam  Constitutional: She is oriented to person, place, and time. She appears distressed.  Frail. Elderly. Thin body habitus.  HENT:  Head: Normocephalic and atraumatic.  Right Ear: External ear normal.  Left Ear: External ear normal.  Nose: Nose normal.  Mouth/Throat: Oropharynx is clear and moist.  Significant loss of hearing bilaterally. Wears hearing aids bilaterally.  Eyes: EOM are normal. Pupils are equal, round, and reactive to light.  Wears corrective lenses.  Neck: Neck supple. No JVD present. No tracheal deviation present. No thyromegaly present.  Cardiovascular: Normal rate, regular rhythm and normal heart sounds.  Exam reveals no gallop and no friction rub.   No murmur heard. Diminished DP and PT  Pulmonary/Chest: No respiratory distress. She has no wheezes. She has rales. She exhibits no tenderness.  Dry rales bilaterally throughout the lung fields.  Abdominal: She exhibits no distension and no mass. There is no tenderness.  Musculoskeletal: She exhibits edema and tenderness.  Restricted movement due to poor balance and continued pain in the right hip and back. Lives in a wheelchair. She can stand  with significant assistance from others. She no longer walks.  Lymphadenopathy:    She has no cervical adenopathy.  Neurological: She is alert and oriented to person, place, and time. No cranial nerve deficit. Coordination normal.  Skin: No rash noted. No erythema. No pallor.  Pressure ulcer of the heel has healed, but there is residual discomfort. Blush area in the RLQ of abd. Scaly area on the right buttock.  Psychiatric: She has a normal mood and affect. Her behavior is normal. Judgment and thought content normal.  Mildly depressed affect.     Labs reviewed: Appointment on 10/25/2014  Component Date Value  Ref Range Status  . WBC 10/25/2014 7.4  3.4 - 10.8 x10E3/uL Final  . RBC 10/25/2014 3.74* 3.77 - 5.28 x10E6/uL Final  . Hemoglobin 10/25/2014 10.9* 11.1 - 15.9 g/dL Final  . HCT 10/25/2014 33.5* 34.0 - 46.6 % Final  . MCV 10/25/2014 90  79 - 97 fL Final  . MCH 10/25/2014 29.1  26.6 - 33.0 pg Final  . MCHC 10/25/2014 32.5  31.5 - 35.7 g/dL Final  . RDW 10/25/2014 14.9  12.3 - 15.4 % Final  . Neutrophils Relative % 10/25/2014 60   Final  . Lymphs 10/25/2014 29   Final  . Monocytes 10/25/2014 7   Final  . Eos 10/25/2014 3   Final  . Basos 10/25/2014 1   Final  . Neutrophils Absolute 10/25/2014 4.5  1.4 - 7.0 x10E3/uL Final  . Lymphocytes Absolute 10/25/2014 2.1  0.7 - 3.1 x10E3/uL Final  . Monocytes Absolute 10/25/2014 0.5  0.1 - 0.9 x10E3/uL Final  . Eosinophils Absolute 10/25/2014 0.2  0.0 - 0.4 x10E3/uL Final  . Basophils Absolute 10/25/2014 0.0  0.0 - 0.2 x10E3/uL Final  . Immature Granulocytes 10/25/2014 0   Final  . Immature Grans (Abs) 10/25/2014 0.0  0.0 - 0.1 x10E3/uL Final  Appointment on 10/07/2014  Component Date Value Ref Range Status  . WBC 10/07/2014 9.4  3.4 - 10.8 x10E3/uL Final  . RBC 10/07/2014 3.36* 3.77 - 5.28 x10E6/uL Final  . Hemoglobin 10/07/2014 9.9* 11.1 - 15.9 g/dL Final  . HCT 10/07/2014 31.3* 34.0 - 46.6 % Final  . MCV 10/07/2014 93  79 - 97 fL  Final  . MCH 10/07/2014 29.5  26.6 - 33.0 pg Final  . MCHC 10/07/2014 31.6  31.5 - 35.7 g/dL Final  . RDW 10/07/2014 14.9  12.3 - 15.4 % Final  . Platelets 10/07/2014 248  150 - 379 x10E3/uL Final  . Neutrophils Relative % 10/07/2014 66   Final  . Lymphs 10/07/2014 24   Final  . Monocytes 10/07/2014 9   Final  . Eos 10/07/2014 1   Final  . Basos 10/07/2014 0   Final  . Neutrophils Absolute 10/07/2014 6.2  1.4 - 7.0 x10E3/uL Final  . Lymphocytes Absolute 10/07/2014 2.3  0.7 - 3.1 x10E3/uL Final  . Monocytes Absolute 10/07/2014 0.8  0.1 - 0.9 x10E3/uL Final  . Eosinophils Absolute 10/07/2014 0.1  0.0 - 0.4 x10E3/uL Final  . Basophils Absolute 10/07/2014 0.0  0.0 - 0.2 x10E3/uL Final  . Immature Granulocytes 10/07/2014 0   Final  . Immature Grans (Abs) 10/07/2014 0.0  0.0 - 0.1 x10E3/uL Final  . Retic Ct Pct 10/07/2014 1.2  0.6 - 2.6 % Final  . Glucose 10/07/2014 103* 65 - 99 mg/dL Final  . BUN 10/07/2014 14  10 - 36 mg/dL Final  . Creatinine, Ser 10/07/2014 0.80  0.57 - 1.00 mg/dL Final  . GFR calc non Af Amer 10/07/2014 63  >59 mL/min/1.73 Final  . GFR calc Af Amer 10/07/2014 72  >59 mL/min/1.73 Final  . BUN/Creatinine Ratio 10/07/2014 18  11 - 26 Final  . Sodium 10/07/2014 141  134 - 144 mmol/L Final  . Potassium 10/07/2014 4.2  3.5 - 5.2 mmol/L Final  . Chloride 10/07/2014 99  97 - 108 mmol/L Final  . CO2 10/07/2014 26  18 - 29 mmol/L Final  . Calcium 10/07/2014 9.6  8.7 - 10.3 mg/dL Final  . Total Protein 10/07/2014 6.0  6.0 - 8.5 g/dL Final  . Albumin 10/07/2014 3.8  3.2 -  4.6 g/dL Final  . Globulin, Total 10/07/2014 2.2  1.5 - 4.5 g/dL Final  . Albumin/Globulin Ratio 10/07/2014 1.7  1.1 - 2.5 Final  . Total Bilirubin 10/07/2014 0.6  0.0 - 1.2 mg/dL Final  . Alkaline Phosphatase 10/07/2014 115  39 - 117 IU/L Final  . AST 10/07/2014 23  0 - 40 IU/L Final  . ALT 10/07/2014 12  0 - 32 IU/L Final  . TSH 10/07/2014 0.691  0.450 - 4.500 uIU/mL Final  Appointment on 10/04/2014    Component Date Value Ref Range Status  . Total Bilirubin 10/04/2014 0.7  0.2 - 1.2 mg/dL Final  . Bilirubin, Direct 10/04/2014 0.2  0.0 - 0.3 mg/dL Final  . Alkaline Phosphatase 10/04/2014 111  39 - 117 U/L Final  . AST 10/04/2014 25  0 - 37 U/L Final  . ALT 10/04/2014 16  0 - 35 U/L Final  . Total Protein 10/04/2014 6.9  6.0 - 8.3 g/dL Final  . Albumin 10/04/2014 3.4* 3.5 - 5.2 g/dL Final  Admission on 09/16/2014, Discharged on 09/21/2014  No results displayed because visit has over 200 results.       Assessment/Plan  1. Ulcer of heel, left, limited to breakdown of skin improved  2. Anemia, unspecified anemia type Continue iron - CBC With differential/Platelet; Future  3. Chronic diastolic congestive heart failure compensated  4. Edema unchaged  5. Gallstone Continue with GI. Sees Dr. Carlean Purl locally.  6. Essential hypertension - Comprehensive metabolic panel; Future  7. Hypokalemia - Comprehensive metabolic panel; Future  8. Hypothyroidism, unspecified hypothyroidism type - TSH; Future  9. Insomnia improved

## 2014-11-17 ENCOUNTER — Other Ambulatory Visit: Payer: Self-pay | Admitting: Internal Medicine

## 2014-11-23 ENCOUNTER — Encounter (HOSPITAL_BASED_OUTPATIENT_CLINIC_OR_DEPARTMENT_OTHER): Payer: Medicare Other | Attending: General Surgery

## 2014-11-23 ENCOUNTER — Other Ambulatory Visit: Payer: Self-pay | Admitting: Internal Medicine

## 2014-11-23 DIAGNOSIS — G4700 Insomnia, unspecified: Secondary | ICD-10-CM | POA: Insufficient documentation

## 2014-11-23 DIAGNOSIS — D649 Anemia, unspecified: Secondary | ICD-10-CM | POA: Diagnosis not present

## 2014-11-23 DIAGNOSIS — R609 Edema, unspecified: Secondary | ICD-10-CM | POA: Insufficient documentation

## 2014-11-23 DIAGNOSIS — E039 Hypothyroidism, unspecified: Secondary | ICD-10-CM | POA: Insufficient documentation

## 2014-11-23 DIAGNOSIS — I10 Essential (primary) hypertension: Secondary | ICD-10-CM | POA: Diagnosis not present

## 2014-11-23 DIAGNOSIS — K8080 Other cholelithiasis without obstruction: Secondary | ICD-10-CM | POA: Insufficient documentation

## 2014-11-23 DIAGNOSIS — E876 Hypokalemia: Secondary | ICD-10-CM | POA: Insufficient documentation

## 2014-11-23 DIAGNOSIS — I5042 Chronic combined systolic (congestive) and diastolic (congestive) heart failure: Secondary | ICD-10-CM | POA: Insufficient documentation

## 2014-11-23 DIAGNOSIS — L97421 Non-pressure chronic ulcer of left heel and midfoot limited to breakdown of skin: Secondary | ICD-10-CM | POA: Diagnosis not present

## 2014-12-22 ENCOUNTER — Other Ambulatory Visit: Payer: Self-pay | Admitting: Internal Medicine

## 2015-01-03 DIAGNOSIS — H524 Presbyopia: Secondary | ICD-10-CM | POA: Diagnosis not present

## 2015-01-03 DIAGNOSIS — H3531 Nonexudative age-related macular degeneration: Secondary | ICD-10-CM | POA: Diagnosis not present

## 2015-01-04 DIAGNOSIS — K5900 Constipation, unspecified: Secondary | ICD-10-CM | POA: Diagnosis not present

## 2015-01-04 DIAGNOSIS — Z79891 Long term (current) use of opiate analgesic: Secondary | ICD-10-CM | POA: Diagnosis not present

## 2015-01-04 DIAGNOSIS — G894 Chronic pain syndrome: Secondary | ICD-10-CM | POA: Diagnosis not present

## 2015-01-04 DIAGNOSIS — M4807 Spinal stenosis, lumbosacral region: Secondary | ICD-10-CM | POA: Diagnosis not present

## 2015-01-04 DIAGNOSIS — M5417 Radiculopathy, lumbosacral region: Secondary | ICD-10-CM | POA: Diagnosis not present

## 2015-01-10 ENCOUNTER — Ambulatory Visit (INDEPENDENT_AMBULATORY_CARE_PROVIDER_SITE_OTHER): Payer: Medicare Other | Admitting: Nurse Practitioner

## 2015-01-10 ENCOUNTER — Encounter: Payer: Self-pay | Admitting: Nurse Practitioner

## 2015-01-10 VITALS — BP 166/94 | HR 75 | Temp 97.4°F

## 2015-01-10 DIAGNOSIS — R609 Edema, unspecified: Secondary | ICD-10-CM | POA: Diagnosis not present

## 2015-01-10 DIAGNOSIS — R410 Disorientation, unspecified: Secondary | ICD-10-CM | POA: Diagnosis not present

## 2015-01-10 DIAGNOSIS — K5900 Constipation, unspecified: Secondary | ICD-10-CM | POA: Diagnosis not present

## 2015-01-10 NOTE — Patient Instructions (Signed)
Push fluids Take colace twice daily for constipation  Will get labs today to look at electrolytes, kidneys, blood count

## 2015-01-10 NOTE — Progress Notes (Signed)
Patient ID: Vicki Velasquez, female   DOB: Jul 20, 1916, 81 y.o.   MRN: [id number]    PCP: Estill Dooms, MD  Allergies  Allergen Reactions  . Accupril [Quinapril Hcl] Other (See Comments)    unknown  . Cephalexin     diarrhea  . Clarithromycin   . Codeine   . Demerol   . Fentanyl   . Lexapro [Escitalopram Oxalate]   . Monistat [Miconazole Nitrate-Wipes]   . Prednisone   . Tagamet [Cimetidine]     Chief Complaint  Patient presents with  . Acute Visit    forgetfull, refused to take some medications today, increase depression, hallucination, paranoia. Given her 1/2 Xanax it helps. (expired 2005) Here with niece Tamela Oddi and Care Giver Maricela Bo     HPI: Patient is a 81 y.o. female seen in the office today for forgetfulness and worsening depression with hallucination  and paranoia. Previously has had a good memory but over the past week and a half this has gotten significantly worse.  Has been refusing medication. Not taking lasix because it is making her urinate. Appetite is poor, decreased fluid intake. Legs are weak. Sleeping in chair, feet down.   Review of Systems:  Review of Systems  Constitutional: Positive for activity change and fatigue. Negative for fever, appetite change and unexpected weight change.  HENT: Positive for hearing loss. Negative for congestion.   Eyes: Negative.   Respiratory: Negative for cough and shortness of breath.   Cardiovascular: Negative for chest pain, palpitations and leg swelling.  Gastrointestinal: Positive for constipation. Negative for abdominal pain and diarrhea.  Genitourinary: Negative for dysuria and difficulty urinating.  Musculoskeletal:       Chronic pain, sees pain management   Skin: Negative for color change and wound.  Neurological: Positive for weakness. Negative for dizziness.  Psychiatric/Behavioral: Positive for behavioral problems, confusion and agitation.       Acute changes in memory and agitation     Past Medical  History  Diagnosis Date  . Atrial flutter   . Hypothyroidism   . Hypertension   . Edema     Leg  . Atrial fibrillation     not on coumadin-managed on meciaitons.  . SOB (shortness of breath) 12/16/10    At rest-no oxygen at rest  . Chronic lung disease     Stable  . Cardiomegaly     Stable  . Disc disease, degenerative, lumbar or lumbosacral     Dr. Marcha Dutton [facility]-Last Steroids 5 yr ago  . Mixed hearing loss, bilateral   . Ingrowing nail 03/04/2013  . Congestive heart failure, unspecified   . Impacted cerumen 05/13/2012  . Other malaise and fatigue   . Pressure ulcer, heel(707.07)   . Hypopotassemia   . Unspecified constipation   . Hypoxemia   . Other abnormal blood chemistry   . Open fracture of unspecified part of neck of femur   . Other chronic pain   . Atherosclerosis of native arteries of the extremities, unspecified   . Scoliosis (and kyphoscoliosis), idiopathic   . Nausea alone   . Urinary frequency   . Proteinuria   . Senile osteoporosis   . Unspecified vitamin D deficiency   . Herpes zoster with other nervous system complications(053.19)   . Depressive disorder, not elsewhere classified   . Cervicalgia   . Dysphagia, unspecified(787.20)   . Sciatica   . Anemia, unspecified   . Lumbago   . Allergic rhinitis due to pollen   . Abnormality  of gait   . Neoplasm of uncertain behavior of skin   . Esophageal reflux   . Mononeuritis of unspecified site   . Diaphragmatic hernia without mention of obstruction or gangrene   . Pain in joint, pelvic region and thigh   . Other and unspecified hyperlipidemia   . Encounter for long-term (current) use of other medications   . Osteoarthrosis, unspecified whether generalized or localized, lower leg   . Other left bundle branch block   . Other heart block   . Dizziness and giddiness   . Undiagnosed cardiac murmurs   . Insomnia, unspecified   . Neuralgia, postherpetic    Past Surgical History  Procedure Laterality  Date  . Aortic valve replacement      2002 replaced with a pig vavle.    . Hip arthroplasty  01/28/2012    Procedure: ARTHROPLASTY BIPOLAR HIP;  Surgeon: Johnn Hai, MD;  Location: [facility];  Service: Orthopedics;  Laterality: Left;  . Partial hysterectomy  1961  . Appendectomy  1961  . Dilation and curettage of uterus  1959  . Resection right parietal meningioma  08/19/1990  . Cardiac catherization  10/1999    dr tysinger  . Aortic valve replacement with a porcine valve      DR HENDRICKSON  . Arthroscopy left knee      DR Eulas Post   . Cataract surgery right eye      DR EPPS  . Orif of fracture (l) femur    . Colonoscopy  04/05/2004    Diverticulosis, Dr.Medoff  . Esophagogastroduodenoscopy  10/03/2008  . Myelogram  10/01/2008    Cervical   . Ercp N/A 09/17/2014    Procedure: ENDOSCOPIC RETROGRADE CHOLANGIOPANCREATOGRAPHY (ERCP);  Surgeon: Gatha Mayer, MD;  Location: [facility];  Service: Endoscopy;  Laterality: N/A;   Social History:   reports that she has never smoked. She has never used smokeless tobacco. She reports that she does not drink alcohol or use illicit drugs.  Family History  Problem Relation Age of Onset  . Stroke Mother   . Cancer Mother     LUNG  . Stroke Father   . Cancer Sister     BREAST,LUNG,LIVER  . Cancer Sister     LYMPHOMA    Medications: Patient's Medications  New Prescriptions   No medications on file  Previous Medications   ALPRAZOLAM (XANAX) 0.25 MG TABLET    Take 0.25 mg by mouth. 1/2 to 1 tablet three times daily as needed for nerves   ASCORBIC ACID (VITAMIN C) 1000 MG TABLET    Take 1,000 mg by mouth daily.   ASPIRIN EC 81 MG TABLET    Take 81 mg by mouth daily.   DOCUSATE SODIUM (COLACE) 100 MG CAPSULE    Take 1 capsule (100 mg total) by mouth 2 (two) times daily.   DULOXETINE (CYMBALTA) 30 MG CAPSULE    Take 30 mg by mouth daily.   FERROUS SULFATE 325 (65 FE) MG TABLET    Take 1 tablet (325 mg total) by mouth daily with  breakfast.   FUROSEMIDE (LASIX) 20 MG TABLET    Take 1 tablet (20 mg total) by mouth daily.   HYDROCORTISONE (PROCTOSOL HC) 2.5 % RECTAL CREAM    Place 1 application rectally 2 (two) times daily as needed for hemorrhoids or itching. Patient denies any serious reaction to steroids.   IBUPROFEN (ADVIL) 200 MG TABLET    Take 200 mg by mouth every 6 (six) hours as  needed for pain.   LEVOTHYROXINE (SYNTHROID, LEVOTHROID) 75 MCG TABLET    Take 75 mcg by mouth daily before breakfast.   LOSARTAN (COZAAR) 100 MG TABLET    TAKE 1 TABLET BY MOUTH ONCE A DAY   METOPROLOL SUCCINATE (TOPROL-XL) 50 MG 24 HR TABLET    Take 50 mg by mouth daily. Take with or immediately following a meal.   MULTIPLE VITAMIN (MULITIVITAMIN WITH MINERALS) TABS    Take 1 tablet by mouth daily.   OMEPRAZOLE (PRILOSEC) 20 MG CAPSULE    Take 20 mg by mouth 2 (two) times daily before a meal.   OXYCODONE (OXY IR/ROXICODONE) 5 MG IMMEDIATE RELEASE TABLET    Take 5 mg by mouth every 6 (six) hours.   POTASSIUM CHLORIDE (KLOR-CON 10) 10 MEQ TABLET    Take 10 mEq by mouth daily.  Modified Medications   No medications on file  Discontinued Medications   DOXYCYCLINE (VIBRAMYCIN) 100 MG CAPSULE    Take 100 mg by mouth 2 (two) times daily.   LEVOTHYROXINE (SYNTHROID, LEVOTHROID) 75 MCG TABLET    TAKE 1 TABLET BY MOUTH EVERY DAY   METOPROLOL SUCCINATE (TOPROL-XL) 50 MG 24 HR TABLET    TAKE 1 TABLET BY MOUTH EVERY DAY     Physical Exam:  Filed Vitals:   01/10/15 1556  BP: 166/94  Pulse: 75  Temp: 97.4 F (36.3 C)  TempSrc: Oral  SpO2: 98%    Physical Exam  Constitutional: She appears well-developed and well-nourished. No distress.  HENT:  Head: Normocephalic and atraumatic.  Mouth/Throat: Oropharynx is clear and moist. No oropharyngeal exudate.  Eyes: Conjunctivae are normal. Pupils are equal, round, and reactive to light.  Neck: Normal range of motion. Neck supple.  Cardiovascular: Normal rate, regular rhythm and normal heart  sounds.   Pulmonary/Chest: Effort normal and breath sounds normal. No respiratory distress. She has no wheezes.  Abdominal: Soft. Bowel sounds are normal. She exhibits no distension. There is no tenderness.  Musculoskeletal: She exhibits edema (3+ pitting edema). She exhibits no tenderness.  Neurological: She is alert.  Skin: Skin is warm and dry. She is not diaphoretic.  Psychiatric:  Confused     Labs reviewed: Basic Metabolic Panel:  Recent Labs  03/08/14 0857  07/05/14 0849  09/20/14 1520 09/21/14 0817 10/07/14 0946  NA 139  < > 141  < > 137 140 141  K 4.1  < > 4.1  < > 3.5* 3.4* 4.2  CL 98  < > 99  < > 97 100 99  CO2 24  < > 25  < > 22 25 26   GLUCOSE 97  < > 87  < > 139* 118* 103*  BUN 19  < > 22  < > 13 12 14   CREATININE 0.89  < > 0.95  < > 0.65 0.72 0.80  CALCIUM 9.5  < > 9.6  < > 9.4 9.1 9.6  TSH 1.820  --  1.710  --   --   --  0.691  < > = values in this interval not displayed. Liver Function Tests:  Recent Labs  09/20/14 1157 09/21/14 0817 10/04/14 1531 10/07/14 0946  AST 39* 28 25 23   ALT 80* 58* 16 12  ALKPHOS 209* 170* 111 115  BILITOT 1.3* 1.4* 0.7 0.6  PROT 6.8 6.2 6.9 6.0  ALBUMIN 3.3* 3.1* 3.4*  --     Recent Labs  09/18/14 0305 09/19/14 0240 09/20/14 1157  LIPASE 102* 98* 38  AMYLASE 105  --   --  No results for input(s): AMMONIA in the last 8760 hours. CBC:  Recent Labs  09/16/14 0220  09/20/14 1157 09/21/14 0817 10/07/14 0946 10/25/14 1022  WBC 13.9*  < > 11.7* 8.7 9.4 7.4  NEUTROABS 11.9*  --   --   --  6.2 4.5  HGB 11.4*  < > 11.9* 10.6* 9.9* 10.9*  HCT 34.8*  < > 36.0 32.5* 31.3* 33.5*  MCV 88.5  < > 88.2 88.6 93 90  PLT 193  < > 194 176 248  --   < > = values in this interval not displayed. Lipid Panel:  Recent Labs  03/08/14 0857  HDL 47  LDLCALC 80  TRIG 94  CHOLHDL 3.1   TSH:  Recent Labs  03/08/14 0857 07/05/14 0849 10/07/14 0946  TSH 1.820 1.710 0.691   A1C: No results found for:  HGBA1C   Assessment/Plan 1. Acute delirium -pt previously without major changes in memory or agitation.  -encouraged proper fluid and oral intake -will get labs to rule out infection or lab abnormality causing changes.  -also pt with constipation that could be contributing. To use colace twice daily  - CBC With differential/Platelet - Basic metabolic panel - TSH - Urine culture - Urinalysis with Reflex Microscopic  2. Constipation As above, increase fluids -colace twice daily  3. Edema Encouraged medication compliance and educated re: need to take  potasium with lasix  TED hose can be applied, to place in am and remove at bedtimes -legs ELEVATED above the level of the heart as tolerates and pt to sleep in bed therefore legs can be elevated at night   Follow up precautions discussed, family understands and agrees

## 2015-01-11 LAB — CBC WITH DIFFERENTIAL
BASOS: 1 %
Basophils Absolute: 0.1 10*3/uL (ref 0.0–0.2)
EOS: 5 %
Eosinophils Absolute: 0.4 10*3/uL (ref 0.0–0.4)
HCT: 33.3 % — ABNORMAL LOW (ref 34.0–46.6)
Hemoglobin: 10.7 g/dL — ABNORMAL LOW (ref 11.1–15.9)
IMMATURE GRANS (ABS): 0 10*3/uL (ref 0.0–0.1)
Immature Granulocytes: 0 %
LYMPHS: 28 %
Lymphocytes Absolute: 2.3 10*3/uL (ref 0.7–3.1)
MCH: 28.1 pg (ref 26.6–33.0)
MCHC: 32.1 g/dL (ref 31.5–35.7)
MCV: 87 fL (ref 79–97)
MONOCYTES: 9 %
Monocytes Absolute: 0.8 10*3/uL (ref 0.1–0.9)
Neutrophils Absolute: 4.7 10*3/uL (ref 1.4–7.0)
Neutrophils Relative %: 57 %
Platelets: 260 10*3/uL (ref 150–379)
RBC: 3.81 x10E6/uL (ref 3.77–5.28)
RDW: 15.5 % — ABNORMAL HIGH (ref 12.3–15.4)
WBC: 8.2 10*3/uL (ref 3.4–10.8)

## 2015-01-11 LAB — BASIC METABOLIC PANEL
BUN/Creatinine Ratio: 20 (ref 11–26)
BUN: 15 mg/dL (ref 10–36)
CALCIUM: 9.7 mg/dL (ref 8.7–10.3)
CO2: 22 mmol/L (ref 18–29)
CREATININE: 0.75 mg/dL (ref 0.57–1.00)
Chloride: 101 mmol/L (ref 97–108)
GFR calc Af Amer: 78 mL/min/{1.73_m2} (ref >59)
GFR calc non Af Amer: 67 mL/min/{1.73_m2} (ref >59)
GLUCOSE: 99 mg/dL (ref 65–99)
POTASSIUM: 4.2 mmol/L (ref 3.5–5.2)
Sodium: 138 mmol/L (ref 134–144)

## 2015-01-11 LAB — URINALYSIS, ROUTINE W REFLEX MICROSCOPIC
Bilirubin, UA: NEGATIVE
Glucose, UA: NEGATIVE
KETONES UA: NEGATIVE
LEUKOCYTES UA: NEGATIVE
Nitrite, UA: NEGATIVE
RBC, UA: NEGATIVE
Specific Gravity, UA: 1.029 (ref 1.005–1.030)
Urobilinogen, Ur: 0.2 mg/dL (ref 0.2–1.0)
pH, UA: 6 (ref 5.0–7.5)

## 2015-01-11 LAB — MICROSCOPIC EXAMINATION: Casts: NONE SEEN /lpf

## 2015-01-11 LAB — TSH: TSH: 0.962 u[IU]/mL (ref 0.450–4.500)

## 2015-01-12 ENCOUNTER — Ambulatory Visit (INDEPENDENT_AMBULATORY_CARE_PROVIDER_SITE_OTHER): Payer: Medicare Other | Admitting: Internal Medicine

## 2015-01-12 VITALS — BP 140/80 | HR 78 | Temp 97.6°F | Resp 18

## 2015-01-12 DIAGNOSIS — H9193 Unspecified hearing loss, bilateral: Secondary | ICD-10-CM

## 2015-01-12 DIAGNOSIS — H6123 Impacted cerumen, bilateral: Secondary | ICD-10-CM

## 2015-01-12 LAB — URINE CULTURE

## 2015-01-12 NOTE — Patient Instructions (Signed)
May use debrox OTC as needed for ear wax  F/u with ENT as needed

## 2015-01-12 NOTE — Progress Notes (Signed)
Patient ID: Vicki Velasquez, female   DOB: 02/13/1916, 81 y.o.   MRN: [id number]    Facility  [facility]    Place of Service:   OFFICE   Allergies  Allergen Reactions  . Accupril [Quinapril Hcl] Other (See Comments)    unknown  . Cephalexin     diarrhea  . Clarithromycin   . Codeine   . Demerol   . Fentanyl   . Lexapro [Escitalopram Oxalate]   . Monistat [Miconazole Nitrate-Wipes]   . Prednisone   . Tagamet [Cimetidine]     Chief Complaint  Patient presents with  . Medical Management of Chronic Issues    ear lavage    HPI:  81 yo female presents today for hearing loss L>R with cerumen impaction as seen by hearing aid specialist 1 week ago. Needs ear lavage prior to hearing being tested. No other concerns  Daughter is present  Medications: Patient's Medications  New Prescriptions   No medications on file  Previous Medications   ALPRAZOLAM (XANAX) 0.25 MG TABLET    Take 0.25 mg by mouth. 1/2 to 1 tablet three times daily as needed for nerves   ASCORBIC ACID (VITAMIN C) 1000 MG TABLET    Take 1,000 mg by mouth daily.   ASPIRIN EC 81 MG TABLET    Take 81 mg by mouth daily.   DOCUSATE SODIUM (COLACE) 100 MG CAPSULE    Take 1 capsule (100 mg total) by mouth 2 (two) times daily.   DULOXETINE (CYMBALTA) 30 MG CAPSULE    Take 30 mg by mouth daily.   FERROUS SULFATE 325 (65 FE) MG TABLET    Take 1 tablet (325 mg total) by mouth daily with breakfast.   FUROSEMIDE (LASIX) 20 MG TABLET    Take 1 tablet (20 mg total) by mouth daily.   HYDROCORTISONE (PROCTOSOL HC) 2.5 % RECTAL CREAM    Place 1 application rectally 2 (two) times daily as needed for hemorrhoids or itching. Patient denies any serious reaction to steroids.   IBUPROFEN (ADVIL) 200 MG TABLET    Take 200 mg by mouth every 6 (six) hours as needed for pain.   LEVOTHYROXINE (SYNTHROID, LEVOTHROID) 75 MCG TABLET    Take 75 mcg by mouth daily before breakfast.   LOSARTAN (COZAAR) 100 MG TABLET    TAKE 1 TABLET BY MOUTH ONCE A DAY     METOPROLOL SUCCINATE (TOPROL-XL) 50 MG 24 HR TABLET    Take 50 mg by mouth daily. Take with or immediately following a meal.   MULTIPLE VITAMIN (MULITIVITAMIN WITH MINERALS) TABS    Take 1 tablet by mouth daily.   OMEPRAZOLE (PRILOSEC) 20 MG CAPSULE    Take 20 mg by mouth 2 (two) times daily before a meal.   OXYCODONE (OXY IR/ROXICODONE) 5 MG IMMEDIATE RELEASE TABLET    Take 5 mg by mouth every 6 (six) hours.   POTASSIUM CHLORIDE (KLOR-CON 10) 10 MEQ TABLET    Take 10 mEq by mouth daily.  Modified Medications   No medications on file  Discontinued Medications   No medications on file     Review of Systems  As above. All other systems reviewed are negative  Filed Vitals:   01/12/15 0940  BP: 140/80  Pulse: 78  Temp: 97.6 F (36.4 C)  TempSrc: Oral  Resp: 18  SpO2: 99%   There is no weight on file to calculate BMI.  Physical Exam  CONSTITUTIONAL: Looks well in NAD. Awake, alert and oriented x  3 HEENT: L>R cerumen impaction. PERRLA. Oropharynx clear and without exudate NECK: Supple. Nontender. No palpable cervical or supraclavicular lymph nodes.   Labs reviewed: Office Visit on 01/10/2015  Component Date Value Ref Range Status  . WBC 01/10/2015 8.2  3.4 - 10.8 x10E3/uL Final  . RBC 01/10/2015 3.81  3.77 - 5.28 x10E6/uL Final  . Hemoglobin 01/10/2015 10.7* 11.1 - 15.9 g/dL Final  . HCT 01/10/2015 33.3* 34.0 - 46.6 % Final  . MCV 01/10/2015 87  79 - 97 fL Final  . MCH 01/10/2015 28.1  26.6 - 33.0 pg Final  . MCHC 01/10/2015 32.1  31.5 - 35.7 g/dL Final  . RDW 01/10/2015 15.5* 12.3 - 15.4 % Final  . Platelets 01/10/2015 260  150 - 379 x10E3/uL Final  . Neutrophils Relative % 01/10/2015 57   Final  . Lymphs 01/10/2015 28   Final  . Monocytes 01/10/2015 9   Final  . Eos 01/10/2015 5   Final  . Basos 01/10/2015 1   Final  . Neutrophils Absolute 01/10/2015 4.7  1.4 - 7.0 x10E3/uL Final  . Lymphocytes Absolute 01/10/2015 2.3  0.7 - 3.1 x10E3/uL Final  . Monocytes Absolute  01/10/2015 0.8  0.1 - 0.9 x10E3/uL Final  . Eosinophils Absolute 01/10/2015 0.4  0.0 - 0.4 x10E3/uL Final  . Basophils Absolute 01/10/2015 0.1  0.0 - 0.2 x10E3/uL Final  . Immature Granulocytes 01/10/2015 0   Final  . Immature Grans (Abs) 01/10/2015 0.0  0.0 - 0.1 x10E3/uL Final  . Glucose 01/10/2015 99  65 - 99 mg/dL Final  . BUN 01/10/2015 15  10 - 36 mg/dL Final  . Creatinine, Ser 01/10/2015 0.75  0.57 - 1.00 mg/dL Final  . GFR calc non Af Amer 01/10/2015 67  >59 mL/min/1.73 Final  . GFR calc Af Amer 01/10/2015 78  >59 mL/min/1.73 Final  . BUN/Creatinine Ratio 01/10/2015 20  11 - 26 Final  . Sodium 01/10/2015 138  134 - 144 mmol/L Final  . Potassium 01/10/2015 4.2  3.5 - 5.2 mmol/L Final  . Chloride 01/10/2015 101  97 - 108 mmol/L Final  . CO2 01/10/2015 22  18 - 29 mmol/L Final  . Calcium 01/10/2015 9.7  8.7 - 10.3 mg/dL Final  . TSH 01/10/2015 0.962  0.450 - 4.500 uIU/mL Final  . Urine Culture, Routine 01/10/2015 Final report   Final  . Result 1 01/10/2015 Comment   Final   Comment: Culture shows less than 10,000 colony forming units of bacteria per milliliter of urine. This colony count is not generally considered to be clinically significant.   Marland Kitchen Specific Gravity, UA 01/10/2015 1.029  1.005 - 1.030 Final  . pH, UA 01/10/2015 6.0  5.0 - 7.5 Final  . Color, UA 01/10/2015 Yellow  Yellow Final  . Appearance Ur 01/10/2015 Cloudy* Clear Final  . Leukocytes, UA 01/10/2015 Negative  Negative Final  . Protein, UA 01/10/2015 1+* Negative/Trace Final  . Glucose, UA 01/10/2015 Negative  Negative Final  . Ketones, UA 01/10/2015 Negative  Negative Final  . RBC, UA 01/10/2015 Negative  Negative Final  . Bilirubin, UA 01/10/2015 Negative  Negative Final  . Urobilinogen, Ur 01/10/2015 0.2  0.2 - 1.0 mg/dL Final  . Nitrite, UA 01/10/2015 Negative  Negative Final  . Microscopic Examination 01/10/2015 See below:   Final   Microscopic was indicated and was performed.  . WBC, UA 01/10/2015 0-5   0 -  5 /hpf Final  . RBC, UA 01/10/2015 0-2  0 -  2 /hpf  Final  . Epithelial Cells (non renal) 01/10/2015 0-10  0 - 10 /hpf Final  . Casts 01/10/2015 None seen  None seen /lpf Final  . Crystals 01/10/2015 Present* N/A Final  . Crystal Type 01/10/2015 Calcium Oxalate  N/A Final  . Mucus, UA 01/10/2015 Present  Not Estab. Final  . Bacteria, UA 01/10/2015 Few  None seen/Few Final  Appointment on 10/25/2014  Component Date Value Ref Range Status  . WBC 10/25/2014 7.4  3.4 - 10.8 x10E3/uL Final  . RBC 10/25/2014 3.74* 3.77 - 5.28 x10E6/uL Final  . Hemoglobin 10/25/2014 10.9* 11.1 - 15.9 g/dL Final  . HCT 10/25/2014 33.5* 34.0 - 46.6 % Final  . MCV 10/25/2014 90  79 - 97 fL Final  . MCH 10/25/2014 29.1  26.6 - 33.0 pg Final  . MCHC 10/25/2014 32.5  31.5 - 35.7 g/dL Final  . RDW 10/25/2014 14.9  12.3 - 15.4 % Final  . Neutrophils Relative % 10/25/2014 60   Final  . Lymphs 10/25/2014 29   Final  . Monocytes 10/25/2014 7   Final  . Eos 10/25/2014 3   Final  . Basos 10/25/2014 1   Final  . Neutrophils Absolute 10/25/2014 4.5  1.4 - 7.0 x10E3/uL Final  . Lymphocytes Absolute 10/25/2014 2.1  0.7 - 3.1 x10E3/uL Final  . Monocytes Absolute 10/25/2014 0.5  0.1 - 0.9 x10E3/uL Final  . Eosinophils Absolute 10/25/2014 0.2  0.0 - 0.4 x10E3/uL Final  . Basophils Absolute 10/25/2014 0.0  0.0 - 0.2 x10E3/uL Final  . Immature Granulocytes 10/25/2014 0   Final  . Immature Grans (Abs) 10/25/2014 0.0  0.0 - 0.1 x10E3/uL Final     Assessment/Plan     ICD-9-CM ICD-10-CM   1. Cerumen impaction, bilateral 380.4 H61.23   2. Hearing loss, bilateral 389.9 H91.93     PROCEDURE NOTE:  After verbal consent obtained, ear lavage performed b/l with evacuation of cerumen. Ear forceps utilized. TMs visualized and appear intact b/l without redness or bulging. Pt tolerated procedure well.  She may use Debrox OTC prn increased cerumen.  F/u as scheduled with Dr Ledell Noss. Perlie Gold    [facility] [address] [place], [place] [address] [phone] Office (Wednesdays and Fridays 8 AM - 5 PM) [phone] Cell (Monday-Friday 8 AM - 5 PM)

## 2015-01-15 ENCOUNTER — Encounter: Payer: Self-pay | Admitting: Internal Medicine

## 2015-01-24 ENCOUNTER — Other Ambulatory Visit: Payer: Self-pay | Admitting: *Deleted

## 2015-01-24 MED ORDER — LOSARTAN POTASSIUM 100 MG PO TABS: 100.0000 mg | ORAL_TABLET | Freq: Every day | ORAL | Status: AC

## 2015-01-24 MED ORDER — OMEPRAZOLE 20 MG PO CPDR: DELAYED_RELEASE_CAPSULE | ORAL | Status: AC

## 2015-01-24 MED ORDER — POTASSIUM CHLORIDE ER 10 MEQ PO TBCR: EXTENDED_RELEASE_TABLET | ORAL | Status: AC

## 2015-01-24 MED ORDER — DULOXETINE HCL 30 MG PO CPEP: 30.0000 mg | ORAL_CAPSULE | Freq: Every day | ORAL | Status: DC

## 2015-01-24 MED ORDER — METOPROLOL SUCCINATE ER 50 MG PO TB24: ORAL_TABLET | ORAL | Status: AC

## 2015-01-24 NOTE — Telephone Encounter (Signed)
[facility] 

## 2015-01-28 ENCOUNTER — Other Ambulatory Visit: Payer: Self-pay | Admitting: Internal Medicine

## 2015-02-10 ENCOUNTER — Other Ambulatory Visit: Payer: Medicare Other

## 2015-02-10 DIAGNOSIS — E039 Hypothyroidism, unspecified: Secondary | ICD-10-CM | POA: Diagnosis not present

## 2015-02-10 DIAGNOSIS — D649 Anemia, unspecified: Secondary | ICD-10-CM

## 2015-02-10 DIAGNOSIS — I10 Essential (primary) hypertension: Secondary | ICD-10-CM | POA: Diagnosis not present

## 2015-02-10 DIAGNOSIS — E876 Hypokalemia: Secondary | ICD-10-CM | POA: Diagnosis not present

## 2015-02-11 LAB — CBC WITH DIFFERENTIAL
Basophils Absolute: 0.1 10*3/uL (ref 0.0–0.2)
Basos: 1 %
Eos: 5 %
Eosinophils Absolute: 0.4 10*3/uL (ref 0.0–0.4)
HCT: 34.3 % (ref 34.0–46.6)
Hemoglobin: 11.1 g/dL (ref 11.1–15.9)
Immature Grans (Abs): 0 10*3/uL (ref 0.0–0.1)
Immature Granulocytes: 0 %
LYMPHS ABS: 1.9 10*3/uL (ref 0.7–3.1)
Lymphs: 24 %
MCH: 28.2 pg (ref 26.6–33.0)
MCHC: 32.4 g/dL (ref 31.5–35.7)
MCV: 87 fL (ref 79–97)
MONOS ABS: 0.6 10*3/uL (ref 0.1–0.9)
Monocytes: 8 %
NEUTROS ABS: 4.8 10*3/uL (ref 1.4–7.0)
NEUTROS PCT: 62 %
RBC: 3.94 x10E6/uL (ref 3.77–5.28)
RDW: 15.7 % — ABNORMAL HIGH (ref 12.3–15.4)
WBC: 7.7 10*3/uL (ref 3.4–10.8)

## 2015-02-11 LAB — COMPREHENSIVE METABOLIC PANEL
ALBUMIN: 3.7 g/dL (ref 3.2–4.6)
ALT: 11 IU/L (ref 0–32)
AST: 19 IU/L (ref 0–40)
Albumin/Globulin Ratio: 1.5 (ref 1.1–2.5)
Alkaline Phosphatase: 144 IU/L — ABNORMAL HIGH (ref 39–117)
BUN/Creatinine Ratio: 18 (ref 11–26)
BUN: 14 mg/dL (ref 10–36)
Bilirubin Total: 0.5 mg/dL (ref 0.0–1.2)
CALCIUM: 9.7 mg/dL (ref 8.7–10.3)
CHLORIDE: 98 mmol/L (ref 97–108)
CO2: 25 mmol/L (ref 18–29)
Creatinine, Ser: 0.8 mg/dL (ref 0.57–1.00)
GFR calc Af Amer: 72 mL/min/{1.73_m2} (ref >59)
GFR calc non Af Amer: 62 mL/min/{1.73_m2} (ref >59)
GLOBULIN, TOTAL: 2.4 g/dL (ref 1.5–4.5)
GLUCOSE: 110 mg/dL — AB (ref 65–99)
Potassium: 4.1 mmol/L (ref 3.5–5.2)
Sodium: 139 mmol/L (ref 134–144)
TOTAL PROTEIN: 6.1 g/dL (ref 6.0–8.5)

## 2015-02-11 LAB — TSH: TSH: 0.717 u[IU]/mL (ref 0.450–4.500)

## 2015-02-15 ENCOUNTER — Encounter: Payer: Self-pay | Admitting: Internal Medicine

## 2015-02-15 ENCOUNTER — Ambulatory Visit (INDEPENDENT_AMBULATORY_CARE_PROVIDER_SITE_OTHER): Payer: Medicare Other | Admitting: Internal Medicine

## 2015-02-15 VITALS — BP 130/78 | HR 71 | Temp 97.4°F | Resp 18 | Ht 61.0 in | Wt 121.4 lb

## 2015-02-15 DIAGNOSIS — R609 Edema, unspecified: Secondary | ICD-10-CM

## 2015-02-15 DIAGNOSIS — I10 Essential (primary) hypertension: Secondary | ICD-10-CM | POA: Diagnosis not present

## 2015-02-15 DIAGNOSIS — E785 Hyperlipidemia, unspecified: Secondary | ICD-10-CM

## 2015-02-15 DIAGNOSIS — F329 Major depressive disorder, single episode, unspecified: Secondary | ICD-10-CM | POA: Insufficient documentation

## 2015-02-15 DIAGNOSIS — R269 Unspecified abnormalities of gait and mobility: Secondary | ICD-10-CM

## 2015-02-15 DIAGNOSIS — E039 Hypothyroidism, unspecified: Secondary | ICD-10-CM

## 2015-02-15 DIAGNOSIS — D649 Anemia, unspecified: Secondary | ICD-10-CM

## 2015-02-15 DIAGNOSIS — I5022 Chronic systolic (congestive) heart failure: Secondary | ICD-10-CM

## 2015-02-15 DIAGNOSIS — F32A Depression, unspecified: Secondary | ICD-10-CM

## 2015-02-15 MED ORDER — DULOXETINE HCL 60 MG PO CPEP: ORAL_CAPSULE | ORAL | Status: DC

## 2015-02-15 NOTE — Progress Notes (Signed)
Patient ID: Vicki Velasquez, female   DOB: [birth year], 81 y.o.   MRN: [id number]    Facility  [facility]    Place of Service:   OFFICE   Allergies  Allergen Reactions  . Accupril [Quinapril Hcl] Other (See Comments)    unknown  . Cephalexin     diarrhea  . Clarithromycin   . Codeine   . Demerol   . Fentanyl   . Lexapro [Escitalopram Oxalate]   . Monistat [Miconazole Nitrate-Wipes]   . Prednisone   . Tagamet [Cimetidine]     Chief Complaint  Patient presents with  . Medical Management of Chronic Issues    HPI:  Losing weight. Not eating well at night.  Anemia, unspecified anemia type: Hemoglobin 11.1 on most recent lab. This falls within the normal limits.  Chronic systolic congestive heart failure: Easily fatigued. Denies significant shortness of breath, palpitations, or chest discomfort. There is some edema of both lower legs that is likely related to her heart failure. It is no worse than usual.  Edema: 1-2+ edema both lower legs and feet.  Essential hypertension: Controlled  Hyperlipidemia: No recent lab  Abnormality of gait: Difficult to stand and move around she has bilateral weakness from the hips down.  Depression - worse despite using DULoxetine (CYMBALTA) 30 MG capsule    Medications: Patient's Medications  New Prescriptions   No medications on file  Previous Medications   ALPRAZOLAM (XANAX) 0.25 MG TABLET    Take 0.25 mg by mouth. 1/2 to 1 tablet three times daily as needed for nerves   ASCORBIC ACID (VITAMIN C) 1000 MG TABLET    Take 1,000 mg by mouth daily.   ASPIRIN EC 81 MG TABLET    Take 81 mg by mouth daily.   DOCUSATE SODIUM (COLACE) 100 MG CAPSULE    Take 1 capsule (100 mg total) by mouth 2 (two) times daily.   DULOXETINE (CYMBALTA) 30 MG CAPSULE    Take 1 capsule (30 mg total) by mouth daily.   FERROUS SULFATE 325 (65 FE) MG TABLET    Take 1 tablet (325 mg total) by mouth daily with breakfast.   FUROSEMIDE (LASIX) 20 MG TABLET    Take 1 tablet  (20 mg total) by mouth daily.   HYDROCORTISONE (PROCTOSOL HC) 2.5 % RECTAL CREAM    Place 1 application rectally 2 (two) times daily as needed for hemorrhoids or itching. Patient denies any serious reaction to steroids.   IBUPROFEN (ADVIL) 200 MG TABLET    Take 200 mg by mouth every 6 (six) hours as needed for pain.   LEVOTHYROXINE (SYNTHROID, LEVOTHROID) 75 MCG TABLET    Take 75 mcg by mouth daily before breakfast.   LEVOTHYROXINE (SYNTHROID, LEVOTHROID) 75 MCG TABLET    TAKE 1 TABLET BY MOUTH EVERY DAY   LOSARTAN (COZAAR) 100 MG TABLET    Take 1 tablet (100 mg total) by mouth daily.   METOPROLOL SUCCINATE (TOPROL-XL) 50 MG 24 HR TABLET    Take one tablet by mouth once daily with a meal to control blood pressure   MULTIPLE VITAMIN (MULITIVITAMIN WITH MINERALS) TABS    Take 1 tablet by mouth daily.   OMEPRAZOLE (PRILOSEC) 20 MG CAPSULE    Take one tablet by mouth twice daily before a meal for stomach   OXYCODONE (OXY IR/ROXICODONE) 5 MG IMMEDIATE RELEASE TABLET    Take 5 mg by mouth every 6 (six) hours.   POTASSIUM CHLORIDE (KLOR-CON 10) 10 MEQ TABLET  Take one tablet by mouth once daily for potassium  Modified Medications   No medications on file  Discontinued Medications   No medications on file     Review of Systems  Constitutional: Positive for activity change and fatigue. Negative for fever, chills, appetite change and unexpected weight change.  HENT: Positive for hearing loss and tinnitus. Negative for congestion and ear pain.   Eyes: Negative.   Respiratory: Negative.   Cardiovascular: Negative.   Gastrointestinal: Negative for diarrhea, constipation and abdominal distention.       Has metal stent in biliary duct due to multiple stones.  Genitourinary:       Urinary leakage.  Musculoskeletal: Negative for neck pain and neck stiffness.       Chronic, debilitating back pains which severely affects her mobility. There is generalized muscle weakness particularly below the waist. She  has joint pains in the hips and knees. There is some restriction of motion at the knees and perhaps the hips as well. She is status post fracture left femur at the hip on 01/26/13. Residual pain is still present. She is very unsteady on standing.  Skin: Positive for rash.       Scarring from prior left heel ucer. Cellulitis left foot has improved.  Neurological: Positive for weakness. Negative for tremors, seizures, syncope and speech difficulty.       Difficulty with balance. Unable to walk.  Hematological: Negative.   Psychiatric/Behavioral: Positive for sleep disturbance.       Early morning awakening.    Filed Vitals:   02/15/15 1415  BP: 130/78  Pulse: 71  Temp: 97.4 F (36.3 C)  TempSrc: Oral  Resp: 18  Height: 5\' 1"  (1.549 m)  Weight: 121 lb 6.4 oz (55.067 kg)  SpO2: 98%   Body mass index is 22.95 kg/(m^2).  Physical Exam  Constitutional: She is oriented to person, place, and time. She appears distressed.  Frail. Elderly. Thin body habitus.  HENT:  Head: Normocephalic and atraumatic.  Right Ear: External ear normal.  Left Ear: External ear normal.  Nose: Nose normal.  Mouth/Throat: Oropharynx is clear and moist.  Significant loss of hearing bilaterally. Wears hearing aids bilaterally.  Eyes: EOM are normal. Pupils are equal, round, and reactive to light.  Wears corrective lenses.  Neck: Neck supple. No JVD present. No tracheal deviation present. No thyromegaly present.  Cardiovascular: Normal rate, regular rhythm and normal heart sounds.  Exam reveals no gallop and no friction rub.   No murmur heard. Diminished DP and PT  Pulmonary/Chest: No respiratory distress. She has no wheezes. She has rales. She exhibits no tenderness.  Dry rales bilaterally throughout the lung fields.  Abdominal: She exhibits no distension and no mass. There is no tenderness.  Musculoskeletal: She exhibits edema and tenderness.  Restricted movement due to poor balance and continued pain in  the right hip and back. Lives in a wheelchair. She can stand with significant assistance from others. She no longer walks.  Lymphadenopathy:    She has no cervical adenopathy.  Neurological: She is alert and oriented to person, place, and time. No cranial nerve deficit. Coordination normal.  Skin: No rash noted. No erythema. No pallor.  Pressure ulcer of the heel has healed, but there is residual discomfort. Blush area in the RLQ of abd. Scaly area on the right buttock.  Psychiatric: She has a normal mood and affect. Her behavior is normal. Judgment and thought content normal.  Mildly depressed affect.  Labs reviewed: Appointment on 02/10/2015  Component Date Value Ref Range Status  . WBC 02/10/2015 7.7  3.4 - 10.8 x10E3/uL Final  . RBC 02/10/2015 3.94  3.77 - 5.28 x10E6/uL Final  . Hemoglobin 02/10/2015 11.1  11.1 - 15.9 g/dL Final  . HCT 02/10/2015 34.3  34.0 - 46.6 % Final  . MCV 02/10/2015 87  79 - 97 fL Final  . MCH 02/10/2015 28.2  26.6 - 33.0 pg Final  . MCHC 02/10/2015 32.4  31.5 - 35.7 g/dL Final  . RDW 02/10/2015 15.7* 12.3 - 15.4 % Final  . Neutrophils Relative % 02/10/2015 62   Final  . Lymphs 02/10/2015 24   Final  . Monocytes 02/10/2015 8   Final  . Eos 02/10/2015 5   Final  . Basos 02/10/2015 1   Final  . Neutrophils Absolute 02/10/2015 4.8  1.4 - 7.0 x10E3/uL Final  . Lymphocytes Absolute 02/10/2015 1.9  0.7 - 3.1 x10E3/uL Final  . Monocytes Absolute 02/10/2015 0.6  0.1 - 0.9 x10E3/uL Final  . Eosinophils Absolute 02/10/2015 0.4  0.0 - 0.4 x10E3/uL Final  . Basophils Absolute 02/10/2015 0.1  0.0 - 0.2 x10E3/uL Final  . Immature Granulocytes 02/10/2015 0   Final  . Immature Grans (Abs) 02/10/2015 0.0  0.0 - 0.1 x10E3/uL Final  . Glucose 02/10/2015 110* 65 - 99 mg/dL Final  . BUN 02/10/2015 14  10 - 36 mg/dL Final  . Creatinine, Ser 02/10/2015 0.80  0.57 - 1.00 mg/dL Final  . GFR calc non Af Amer 02/10/2015 62  >59 mL/min/1.73 Final  . GFR calc Af Amer  02/10/2015 72  >59 mL/min/1.73 Final  . BUN/Creatinine Ratio 02/10/2015 18  11 - 26 Final  . Sodium 02/10/2015 139  134 - 144 mmol/L Final  . Potassium 02/10/2015 4.1  3.5 - 5.2 mmol/L Final  . Chloride 02/10/2015 98  97 - 108 mmol/L Final  . CO2 02/10/2015 25  18 - 29 mmol/L Final  . Calcium 02/10/2015 9.7  8.7 - 10.3 mg/dL Final  . Total Protein 02/10/2015 6.1  6.0 - 8.5 g/dL Final  . Albumin 02/10/2015 3.7  3.2 - 4.6 g/dL Final  . Globulin, Total 02/10/2015 2.4  1.5 - 4.5 g/dL Final  . Albumin/Globulin Ratio 02/10/2015 1.5  1.1 - 2.5 Final  . Bilirubin Total 02/10/2015 0.5  0.0 - 1.2 mg/dL Final  . Alkaline Phosphatase 02/10/2015 144* 39 - 117 IU/L Final  . AST 02/10/2015 19  0 - 40 IU/L Final  . ALT 02/10/2015 11  0 - 32 IU/L Final  . TSH 02/10/2015 0.717  0.450 - 4.500 uIU/mL Final  Office Visit on 01/10/2015  Component Date Value Ref Range Status  . WBC 01/10/2015 8.2  3.4 - 10.8 x10E3/uL Final  . RBC 01/10/2015 3.81  3.77 - 5.28 x10E6/uL Final  . Hemoglobin 01/10/2015 10.7* 11.1 - 15.9 g/dL Final  . HCT 01/10/2015 33.3* 34.0 - 46.6 % Final  . MCV 01/10/2015 87  79 - 97 fL Final  . MCH 01/10/2015 28.1  26.6 - 33.0 pg Final  . MCHC 01/10/2015 32.1  31.5 - 35.7 g/dL Final  . RDW 01/10/2015 15.5* 12.3 - 15.4 % Final  . Platelets 01/10/2015 260  150 - 379 x10E3/uL Final  . Neutrophils Relative % 01/10/2015 57   Final  . Lymphs 01/10/2015 28   Final  . Monocytes 01/10/2015 9   Final  . Eos 01/10/2015 5   Final  . Basos 01/10/2015 1   Final  .  Neutrophils Absolute 01/10/2015 4.7  1.4 - 7.0 x10E3/uL Final  . Lymphocytes Absolute 01/10/2015 2.3  0.7 - 3.1 x10E3/uL Final  . Monocytes Absolute 01/10/2015 0.8  0.1 - 0.9 x10E3/uL Final  . Eosinophils Absolute 01/10/2015 0.4  0.0 - 0.4 x10E3/uL Final  . Basophils Absolute 01/10/2015 0.1  0.0 - 0.2 x10E3/uL Final  . Immature Granulocytes 01/10/2015 0   Final  . Immature Grans (Abs) 01/10/2015 0.0  0.0 - 0.1 x10E3/uL Final  . Glucose  01/10/2015 99  65 - 99 mg/dL Final  . BUN 01/10/2015 15  10 - 36 mg/dL Final  . Creatinine, Ser 01/10/2015 0.75  0.57 - 1.00 mg/dL Final  . GFR calc non Af Amer 01/10/2015 67  >59 mL/min/1.73 Final  . GFR calc Af Amer 01/10/2015 78  >59 mL/min/1.73 Final  . BUN/Creatinine Ratio 01/10/2015 20  11 - 26 Final  . Sodium 01/10/2015 138  134 - 144 mmol/L Final  . Potassium 01/10/2015 4.2  3.5 - 5.2 mmol/L Final  . Chloride 01/10/2015 101  97 - 108 mmol/L Final  . CO2 01/10/2015 22  18 - 29 mmol/L Final  . Calcium 01/10/2015 9.7  8.7 - 10.3 mg/dL Final  . TSH 01/10/2015 0.962  0.450 - 4.500 uIU/mL Final  . Urine Culture, Routine 01/10/2015 Final report   Final  . Result 1 01/10/2015 Comment   Final   Comment: Culture shows less than 10,000 colony forming units of bacteria per milliliter of urine. This colony count is not generally considered to be clinically significant.   Marland Kitchen Specific Gravity, UA 01/10/2015 1.029  1.005 - 1.030 Final  . pH, UA 01/10/2015 6.0  5.0 - 7.5 Final  . Color, UA 01/10/2015 Yellow  Yellow Final  . Appearance Ur 01/10/2015 Cloudy* Clear Final  . Leukocytes, UA 01/10/2015 Negative  Negative Final  . Protein, UA 01/10/2015 1+* Negative/Trace Final  . Glucose, UA 01/10/2015 Negative  Negative Final  . Ketones, UA 01/10/2015 Negative  Negative Final  . RBC, UA 01/10/2015 Negative  Negative Final  . Bilirubin, UA 01/10/2015 Negative  Negative Final  . Urobilinogen, Ur 01/10/2015 0.2  0.2 - 1.0 mg/dL Final  . Nitrite, UA 01/10/2015 Negative  Negative Final  . Microscopic Examination 01/10/2015 See below:   Final   Microscopic was indicated and was performed.  . WBC, UA 01/10/2015 0-5  0 -  5 /hpf Final  . RBC, UA 01/10/2015 0-2  0 -  2 /hpf Final  . Epithelial Cells (non renal) 01/10/2015 0-10  0 - 10 /hpf Final  . Casts 01/10/2015 None seen  None seen /lpf Final  . Crystals 01/10/2015 Present* N/A Final  . Crystal Type 01/10/2015 Calcium Oxalate  N/A Final  . Mucus,  UA 01/10/2015 Present  Not Estab. Final  . Bacteria, UA 01/10/2015 Few  None seen/Few Final     Assessment/Plan  1. Anemia, unspecified anemia type Resolved  2. Chronic systolic congestive heart failure Appears to be reasonably well compensated  3. Edema Stable  4. Essential hypertension 12 - Comprehensive metabolic panel; Future  5. Hyperlipidemia Follow-up the future visit  6. Abnormality of gait Patient is not likely to benefit from physical therapy  7. Depression Worsening depression. Will try increasing the dose of Cymbalta first. - DULoxetine (CYMBALTA) 60 MG capsule; One daily to help depression and pain  Dispense: 30 capsule; Refill: 3  8. Hypothyroidism, unspecified hypothyroidism type - TSH; Future

## 2015-02-28 DIAGNOSIS — M5417 Radiculopathy, lumbosacral region: Secondary | ICD-10-CM | POA: Diagnosis not present

## 2015-02-28 DIAGNOSIS — M4807 Spinal stenosis, lumbosacral region: Secondary | ICD-10-CM | POA: Diagnosis not present

## 2015-02-28 DIAGNOSIS — G894 Chronic pain syndrome: Secondary | ICD-10-CM | POA: Diagnosis not present

## 2015-02-28 DIAGNOSIS — K5900 Constipation, unspecified: Secondary | ICD-10-CM | POA: Diagnosis not present

## 2015-04-11 DIAGNOSIS — M5417 Radiculopathy, lumbosacral region: Secondary | ICD-10-CM | POA: Diagnosis not present

## 2015-04-11 DIAGNOSIS — M4807 Spinal stenosis, lumbosacral region: Secondary | ICD-10-CM | POA: Diagnosis not present

## 2015-04-11 DIAGNOSIS — K5900 Constipation, unspecified: Secondary | ICD-10-CM | POA: Diagnosis not present

## 2015-04-11 DIAGNOSIS — G894 Chronic pain syndrome: Secondary | ICD-10-CM | POA: Diagnosis not present

## 2015-04-29 ENCOUNTER — Other Ambulatory Visit: Payer: Self-pay | Admitting: Internal Medicine

## 2015-05-03 ENCOUNTER — Other Ambulatory Visit: Payer: Self-pay | Admitting: Internal Medicine

## 2015-05-09 ENCOUNTER — Telehealth: Payer: Self-pay | Admitting: *Deleted

## 2015-05-09 NOTE — Telephone Encounter (Signed)
[facility] had a call in a prescription for 30 tablets of Xanax 0.25 mg. Directions are 1/2-1 tablet every 6 hours as needed for anxiety.

## 2015-05-09 NOTE — Telephone Encounter (Signed)
Pat, Caregiver called and stated that patient had a breakdown this weekend. Refused to take any medication, refused to eat. Had old medication for Xanax 0.25mg  and gave her 1/2 tablets of that. It worked and now they want to know if they could have a recent Rx for this just in case it happens again. Please Advise.

## 2015-05-10 MED ORDER — ALPRAZOLAM 0.25 MG PO TABS: ORAL_TABLET | ORAL | Status: DC

## 2015-05-10 NOTE — Telephone Encounter (Signed)
Rx faxed to pharmacy and [facility] for patient that it was faxed.

## 2015-05-12 ENCOUNTER — Other Ambulatory Visit: Payer: Medicare Other

## 2015-05-12 DIAGNOSIS — I10 Essential (primary) hypertension: Secondary | ICD-10-CM | POA: Diagnosis not present

## 2015-05-12 DIAGNOSIS — E039 Hypothyroidism, unspecified: Secondary | ICD-10-CM | POA: Diagnosis not present

## 2015-05-13 LAB — COMPREHENSIVE METABOLIC PANEL
A/G RATIO: 1.6 (ref 1.1–2.5)
ALT: 14 IU/L (ref 0–32)
AST: 22 IU/L (ref 0–40)
Albumin: 4 g/dL (ref 3.2–4.6)
Alkaline Phosphatase: 133 IU/L — ABNORMAL HIGH (ref 39–117)
BUN/Creatinine Ratio: 22 (ref 11–26)
BUN: 17 mg/dL (ref 10–36)
Bilirubin Total: 0.4 mg/dL (ref 0.0–1.2)
CO2: 25 mmol/L (ref 18–29)
Calcium: 9.7 mg/dL (ref 8.7–10.3)
Chloride: 98 mmol/L (ref 97–108)
Creatinine, Ser: 0.78 mg/dL (ref 0.57–1.00)
GFR calc non Af Amer: 64 mL/min/{1.73_m2} (ref >59)
GFR, EST AFRICAN AMERICAN: 74 mL/min/{1.73_m2} (ref >59)
GLUCOSE: 102 mg/dL — AB (ref 65–99)
Globulin, Total: 2.5 g/dL (ref 1.5–4.5)
Potassium: 4.1 mmol/L (ref 3.5–5.2)
Sodium: 139 mmol/L (ref 134–144)
Total Protein: 6.5 g/dL (ref 6.0–8.5)

## 2015-05-13 LAB — TSH: TSH: 1.13 u[IU]/mL (ref 0.450–4.500)

## 2015-05-17 ENCOUNTER — Encounter: Payer: Self-pay | Admitting: Internal Medicine

## 2015-05-17 ENCOUNTER — Ambulatory Visit (INDEPENDENT_AMBULATORY_CARE_PROVIDER_SITE_OTHER): Payer: Medicare Other | Admitting: Internal Medicine

## 2015-05-17 VITALS — BP 118/72 | HR 71 | Temp 98.1°F | Resp 16 | Ht 61.0 in | Wt 111.0 lb

## 2015-05-17 DIAGNOSIS — R609 Edema, unspecified: Secondary | ICD-10-CM

## 2015-05-17 DIAGNOSIS — D649 Anemia, unspecified: Secondary | ICD-10-CM | POA: Diagnosis not present

## 2015-05-17 DIAGNOSIS — I10 Essential (primary) hypertension: Secondary | ICD-10-CM | POA: Diagnosis not present

## 2015-05-17 DIAGNOSIS — F32A Depression, unspecified: Secondary | ICD-10-CM

## 2015-05-17 DIAGNOSIS — L821 Other seborrheic keratosis: Secondary | ICD-10-CM

## 2015-05-17 DIAGNOSIS — F329 Major depressive disorder, single episode, unspecified: Secondary | ICD-10-CM | POA: Diagnosis not present

## 2015-05-17 DIAGNOSIS — R0600 Dyspnea, unspecified: Secondary | ICD-10-CM | POA: Diagnosis not present

## 2015-05-17 DIAGNOSIS — E039 Hypothyroidism, unspecified: Secondary | ICD-10-CM

## 2015-05-17 HISTORY — DX: Other seborrheic keratosis: L82.1

## 2015-05-17 MED ORDER — DULOXETINE HCL 60 MG PO CPEP: ORAL_CAPSULE | ORAL | Status: AC

## 2015-05-17 MED ORDER — HYDROCORTISONE 1 % EX CREA: TOPICAL_CREAM | CUTANEOUS | Status: AC

## 2015-05-17 NOTE — Progress Notes (Signed)
Patient ID: Vicki Velasquez, female   DOB: [birth year], 81 y.o.   MRN: [id number]    Facility  [facility]    Place of Service:   OFFICE    Allergies  Allergen Reactions  . Accupril [Quinapril Hcl] Other (See Comments)    unknown  . Cephalexin     diarrhea  . Clarithromycin   . Codeine   . Demerol   . Fentanyl   . Lexapro [Escitalopram Oxalate]   . Monistat [Miconazole Nitrate-Wipes]   . Prednisone   . Tagamet [Cimetidine]     Chief Complaint  Patient presents with  . Medical Management of Chronic Issues    3 month follow-up, discuss labs (copy printed)      HPI:  Depression - unchanged. Has benefited from duloxetine.  Dyspnea: Continues to complain of some shortness of breath. She says it's worse in the mornings. She denies cough.  Essential hypertension: Controlled  Hypothyroidism, unspecified hypothyroidism type - compensated  Edema: Bipedal and unchanged at about 2+  Seborrheic keratosis -right neck, erythematous flare around a seborrheic keratosis.  Anemia, unspecified anemia type: Stable    Medications: Patient's Medications  New Prescriptions   No medications on file  Previous Medications   ALPRAZOLAM (XANAX) 0.25 MG TABLET    Take 1/2 to 1 tablet by mouth three times daily as needed for nerves   ASCORBIC ACID (VITAMIN C) 1000 MG TABLET    Take 1,000 mg by mouth daily.   ASPIRIN EC 81 MG TABLET    Take 81 mg by mouth daily.   DULOXETINE (CYMBALTA) 60 MG CAPSULE    One daily to help depression and pain   FERROUS SULFATE 325 (65 FE) MG TABLET    Take 1 tablet (325 mg total) by mouth daily with breakfast.   FUROSEMIDE (LASIX) 20 MG TABLET    Take 1 tablet (20 mg total) by mouth daily.   HYDROCORTISONE (PROCTOSOL HC) 2.5 % RECTAL CREAM    Place 1 application rectally 2 (two) times daily as needed for hemorrhoids or itching. Patient denies any serious reaction to steroids.   IBUPROFEN (ADVIL) 200 MG TABLET    Take 200 mg by mouth every 6 (six) hours as needed for  pain.   LEVOTHYROXINE (SYNTHROID, LEVOTHROID) 75 MCG TABLET    TAKE 1 TABLET BY MOUTH EVERY DAY   LOSARTAN (COZAAR) 100 MG TABLET    Take 1 tablet (100 mg total) by mouth daily.   METOPROLOL SUCCINATE (TOPROL-XL) 50 MG 24 HR TABLET    Take one tablet by mouth once daily with a meal to control blood pressure   MULTIPLE VITAMIN (MULITIVITAMIN WITH MINERALS) TABS    Take 1 tablet by mouth daily.   OMEPRAZOLE (PRILOSEC) 20 MG CAPSULE    Take one tablet by mouth twice daily before a meal for stomach   OXYCODONE (OXY IR/ROXICODONE) 5 MG IMMEDIATE RELEASE TABLET    Take 5 mg by mouth every 6 (six) hours.   POTASSIUM CHLORIDE (KLOR-CON 10) 10 MEQ TABLET    Take one tablet by mouth once daily for potassium  Modified Medications   No medications on file  Discontinued Medications   DOCUSATE SODIUM (COLACE) 100 MG CAPSULE    Take 1 capsule (100 mg total) by mouth 2 (two) times daily.   KLOR-CON M10 10 MEQ TABLET    Take 10 mEq by mouth daily.   LEVOTHYROXINE (SYNTHROID, LEVOTHROID) 75 MCG TABLET    TAKE 1 TABLET BY MOUTH EVERY DAY  Review of Systems  Constitutional: Positive for activity change and fatigue. Negative for fever, chills, appetite change and unexpected weight change.  HENT: Positive for hearing loss and tinnitus. Negative for congestion and ear pain.   Eyes: Negative.   Respiratory: Positive for shortness of breath (occasionally).        Says thre dyspnea is worse in the mornings.  Cardiovascular: Negative.   Gastrointestinal: Negative for diarrhea, constipation and abdominal distention.       Has metal stent in biliary duct due to multiple stones.  Genitourinary:       Urinary leakage.  Musculoskeletal: Negative for neck pain and neck stiffness.       Chronic, debilitating back pains which severely affects her mobility. There is generalized muscle weakness particularly below the waist. She has joint pains in the hips and knees. There is some restriction of motion at the knees and  perhaps the hips as well. She is status post fracture left femur at the hip on 01/26/13. Residual pain is still present. She is very unsteady on standing.  Skin: Positive for rash.       Scarring from prior left heel ucer. Cellulitis left foot has improved.  Neurological: Positive for weakness. Negative for tremors, seizures, syncope and speech difficulty.       Difficulty with balance. Unable to walk.  Hematological: Negative.   Psychiatric/Behavioral: Positive for sleep disturbance.       Early morning awakening.    Filed Vitals:   05/17/15 1326  BP: 118/72  Pulse: 71  Temp: 98.1 F (36.7 C)  TempSrc: Oral  Resp: 16  Height: 5\' 1"  (1.549 m)  Weight: 111 lb (50.349 kg)  SpO2: 97%   Body mass index is 20.98 kg/(m^2).  Physical Exam  Constitutional: She is oriented to person, place, and time. She appears distressed.  Frail. Elderly. Thin body habitus.  HENT:  Head: Normocephalic and atraumatic.  Right Ear: External ear normal.  Left Ear: External ear normal.  Nose: Nose normal.  Mouth/Throat: Oropharynx is clear and moist.  Significant loss of hearing bilaterally. Wears hearing aids bilaterally.  Eyes: EOM are normal. Pupils are equal, round, and reactive to light.  Wears corrective lenses.  Neck: Neck supple. No JVD present. No tracheal deviation present. No thyromegaly present.  Cardiovascular: Normal rate, regular rhythm and normal heart sounds.  Exam reveals no gallop and no friction rub.   No murmur heard. Diminished DP and PT  Pulmonary/Chest: No respiratory distress. She has no wheezes. She has rales (bilateral dry ). She exhibits no tenderness.  Dry rales bilaterally throughout the lung fields.  Abdominal: She exhibits no distension and no mass. There is no tenderness.  Musculoskeletal: She exhibits edema (2+ bipedal) and tenderness.  Restricted movement due to poor balance and continued pain in the right hip and back. Lives in a wheelchair. She can stand with  significant assistance from others. She no longer walks.  Lymphadenopathy:    She has no cervical adenopathy.  Neurological: She is alert and oriented to person, place, and time. No cranial nerve deficit. Coordination normal.  Skin: No rash noted. No erythema. No pallor.  Pressure ulcer of the heel has healed, but there is residual discomfort. Blush area in the RLQ of abd. Scaly area on the right buttock. Erythematous flare around the seborrheic keratosis of the right neck.  Psychiatric: She has a normal mood and affect. Her behavior is normal. Judgment and thought content normal.  Mildly depressed affect.  Labs reviewed: Appointment on 05/12/2015  Component Date Value Ref Range Status  . Glucose 05/12/2015 102* 65 - 99 mg/dL Final  . BUN 05/12/2015 17  10 - 36 mg/dL Final  . Creatinine, Ser 05/12/2015 0.78  0.57 - 1.00 mg/dL Final  . GFR calc non Af Amer 05/12/2015 64  >59 mL/min/1.73 Final  . GFR calc Af Amer 05/12/2015 74  >59 mL/min/1.73 Final  . BUN/Creatinine Ratio 05/12/2015 22  11 - 26 Final  . Sodium 05/12/2015 139  134 - 144 mmol/L Final  . Potassium 05/12/2015 4.1  3.5 - 5.2 mmol/L Final  . Chloride 05/12/2015 98  97 - 108 mmol/L Final  . CO2 05/12/2015 25  18 - 29 mmol/L Final  . Calcium 05/12/2015 9.7  8.7 - 10.3 mg/dL Final  . Total Protein 05/12/2015 6.5  6.0 - 8.5 g/dL Final  . Albumin 05/12/2015 4.0  3.2 - 4.6 g/dL Final  . Globulin, Total 05/12/2015 2.5  1.5 - 4.5 g/dL Final  . Albumin/Globulin Ratio 05/12/2015 1.6  1.1 - 2.5 Final  . Bilirubin Total 05/12/2015 0.4  0.0 - 1.2 mg/dL Final  . Alkaline Phosphatase 05/12/2015 133* 39 - 117 IU/L Final  . AST 05/12/2015 22  0 - 40 IU/L Final  . ALT 05/12/2015 14  0 - 32 IU/L Final  . TSH 05/12/2015 1.130  0.450 - 4.500 uIU/mL Final     Assessment/Plan  1. Depression - DULoxetine (CYMBALTA) 60 MG capsule; One daily to help depression and pain  Dispense: 30 capsule; Refill: 5  2. Dyspnea observe  3.  Essential hypertension - Comprehensive metabolic panel; Future  4. Hypothyroidism, unspecified hypothyroidism type - TSH; Future  5. Edema Unchanged. Continue furosemide.  6. Seborrheic keratosis - hydrocortisone cream 1 %; Apply twice daily to rash  Dispense: 30 g; Refill: 0  7. Anemia, unspecified anemia type unchanged

## 2015-05-17 NOTE — Patient Instructions (Addendum)
REMINDER: Bring copy of Health Care Power of Attorney/Living Will  Try 1% hydrocortisone cream to thr irritated area on the right neck.

## 2015-05-26 DIAGNOSIS — K5900 Constipation, unspecified: Secondary | ICD-10-CM | POA: Diagnosis not present

## 2015-05-26 DIAGNOSIS — M4807 Spinal stenosis, lumbosacral region: Secondary | ICD-10-CM | POA: Diagnosis not present

## 2015-05-26 DIAGNOSIS — M5417 Radiculopathy, lumbosacral region: Secondary | ICD-10-CM | POA: Diagnosis not present

## 2015-05-26 DIAGNOSIS — G894 Chronic pain syndrome: Secondary | ICD-10-CM | POA: Diagnosis not present

## 2015-06-15 ENCOUNTER — Telehealth: Payer: Self-pay | Admitting: *Deleted

## 2015-06-15 NOTE — Telephone Encounter (Signed)
Pat, Caregiver called and stated that patient's PRN Xanax is "knocking her for a loop" Please Advise.

## 2015-06-16 MED ORDER — MIRTAZAPINE 7.5 MG PO TABS: ORAL_TABLET | ORAL | Status: DC

## 2015-06-16 NOTE — Telephone Encounter (Signed)
If she has not been on remeron, let's try remeron 7.5mg  po qhs for her mood and appetite.

## 2015-06-16 NOTE — Telephone Encounter (Signed)
Used for anxiety/Breakdowns. Just started back on it on 05/09/2015 as needed due to patient having a breakdown and refusing to take her medications or eat.

## 2015-06-16 NOTE — Telephone Encounter (Signed)
She is Dr. Rolly Salter patient so I am not familiar with her.  What was the purpose of the xanax?  How often is she getting it?  If she is not using it much, it can be stopped, but she may need something different if she is having a problem like anxious episodes or behavioral problems.

## 2015-06-16 NOTE — Telephone Encounter (Signed)
Patient stated that patient hasn't taken the remeron (Mirtazapine) Informed her to discontinue the Xanax and start this medication. She agreed. Faxed Rx to pharmacy.

## 2015-06-23 ENCOUNTER — Telehealth: Payer: Self-pay | Admitting: *Deleted

## 2015-06-23 NOTE — Telephone Encounter (Addendum)
Patient caregiver, Fraser Din called back and stated that she talked with patient's son, Vicki Velasquez and he doesn't want the Mirtazapine. He wants the Rx for the Diazepam. What should the directions be? Please Advise.

## 2015-06-23 NOTE — Telephone Encounter (Signed)
I don't mind prescribing Diazepam 2 mg. She may want to break the mirtazapine in half instead of going to a new medication. If she prefers the diazepam, it will require a written prescription.

## 2015-06-23 NOTE — Telephone Encounter (Signed)
Fraser Din return call and stated that the Remeron was started last night and it puts her out just like the Xanax, makes patient really sleepy and makes her breath real slow. States that patient has taken Valium in the past and did well with it. Can this be ordered instead. Please Advise.

## 2015-06-23 NOTE — Telephone Encounter (Signed)
Dr. Mariea Clonts prescribe Remeron and discontinued Xanax due to it "knocking patient out" Vicki Velasquez called today and and left message to return her call regarding the Remeron.  LMOM for pat to return call.

## 2015-06-23 NOTE — Telephone Encounter (Signed)
Caregiver, Fraser Din notified and agreed and will let son know. Will call us back to let us know how this works by cutting the mirtazapine in half.

## 2015-06-23 NOTE — Telephone Encounter (Signed)
Diazepam 2 mg (#30) Take 1/2 to 1 tablet at bed as needed for rest or anxiety.

## 2015-06-23 NOTE — Addendum Note (Signed)
Addended by: Rafael Bihari A on: 06/23/2015 03:31 PM   Modules accepted: Orders, Medications

## 2015-06-24 MED ORDER — DIAZEPAM 2 MG PO TABS: ORAL_TABLET | ORAL | Status: AC

## 2015-06-24 NOTE — Telephone Encounter (Signed)
Patient caregiver, Vicki Velasquez Notified and agreed. Instructed her to stop the Mirtazapine and start Diazepam. Agreed. Will stop by today and pick up Rx.

## 2015-06-24 NOTE — Addendum Note (Signed)
Addended by: Rafael Bihari A on: 06/24/2015 08:38 AM   Modules accepted: Orders

## 2015-07-05 ENCOUNTER — Ambulatory Visit (INDEPENDENT_AMBULATORY_CARE_PROVIDER_SITE_OTHER): Payer: Medicare Other | Admitting: Internal Medicine

## 2015-07-05 ENCOUNTER — Encounter: Payer: Self-pay | Admitting: Internal Medicine

## 2015-07-05 VITALS — BP 130/86 | HR 79 | Temp 98.3°F | Resp 18

## 2015-07-05 DIAGNOSIS — I482 Chronic atrial fibrillation, unspecified: Secondary | ICD-10-CM

## 2015-07-05 DIAGNOSIS — F03918 Unspecified dementia, unspecified severity, with other behavioral disturbance: Secondary | ICD-10-CM

## 2015-07-05 DIAGNOSIS — R269 Unspecified abnormalities of gait and mobility: Secondary | ICD-10-CM | POA: Diagnosis not present

## 2015-07-05 DIAGNOSIS — G4700 Insomnia, unspecified: Secondary | ICD-10-CM

## 2015-07-05 DIAGNOSIS — F329 Major depressive disorder, single episode, unspecified: Secondary | ICD-10-CM

## 2015-07-05 DIAGNOSIS — I10 Essential (primary) hypertension: Secondary | ICD-10-CM

## 2015-07-05 DIAGNOSIS — F0391 Unspecified dementia with behavioral disturbance: Secondary | ICD-10-CM | POA: Diagnosis not present

## 2015-07-05 DIAGNOSIS — R531 Weakness: Secondary | ICD-10-CM | POA: Diagnosis not present

## 2015-07-05 DIAGNOSIS — R609 Edema, unspecified: Secondary | ICD-10-CM

## 2015-07-05 DIAGNOSIS — E039 Hypothyroidism, unspecified: Secondary | ICD-10-CM | POA: Diagnosis not present

## 2015-07-05 DIAGNOSIS — G8929 Other chronic pain: Secondary | ICD-10-CM

## 2015-07-05 DIAGNOSIS — F32A Depression, unspecified: Secondary | ICD-10-CM

## 2015-07-05 HISTORY — DX: Weakness: R53.1

## 2015-07-05 NOTE — Progress Notes (Signed)
Patient ID: Vicki Velasquez, female   DOB: 1916-05-05, 81 y.o.   MRN: [id number]    Facility  [facility]    Place of Service:   OFFICE    Allergies  Allergen Reactions  . Accupril [Quinapril Hcl] Other (See Comments)    unknown  . Cephalexin     diarrhea  . Clarithromycin   . Codeine   . Demerol   . Fentanyl   . Lexapro [Escitalopram Oxalate]   . Monistat [Miconazole Nitrate-Wipes]   . Prednisone   . Tagamet [Cimetidine]     Chief Complaint  Patient presents with  . Medical Management of Chronic Issues    weaker than normal, sleeping more than normal, hydrocodone is making things worse  . Medical Management of Chronic Issues    hospice is needed, hospital bed    HPI:  Depression: Stable on Cymbalta.  Abnormality of gait: Unable to stand or pivot without heavy assistance. Son has hired a second Engineer, production as two people are necessary to assist with transfers and bathing.  Insomnia: Not an issue per family. Sleeps from 10p or midnight until 8 am easily most days. No trouble falling asleep or early morning awakening.  Edema: Stable 2+ ankle edema. Does not take her Lasix and Potassium everyday.  Hypothyroidism, unspecified hypothyroidism type: Controlled  Chronic pain: Has been taking 5 mg Oxycodone qid for years for low back pain, heel pain following healed decubitus ulcer as well as 'pinched nerve pain following a hip fracture." Daughter in law notes that she sleeps through her 4 am dose and they do not wake her to give it. Patient drowsy most days, sleeping often during day. Family would like to taper her off her oxycodone if able. Today she was not given her morning dose and is much more awake than usual. Family adds she does not ask for doses if they are not provided, does not appear to be in pain.   Chronic atrial fibrillation: Stable. No palpitations or chest pains.  Essential hypertension: Controlled.   Weakness: Worsening greatly over last month. Requires additional  assistance with transfers. Difficulty transferring in and out of bed, which family has already moved as low as they can. Family hopeful that they could get an order for a hospital bed to assist with transfers and mobility in bed.  Dementia with behavioral disturbance: Increasing occasions of confusion over last month, including hallucinations, which are non-threatening, confusions worse at night, agitation at times - banging fists on arm rests, yelling and demanding "to go home." Has taken diazepam twice for the anxiety and agitation with good result per family. Tried Alprazolam and Remeron in the past but these made her 'too drowsy.'    Medications: Patient's Medications  New Prescriptions   No medications on file  Previous Medications   ALPRAZOLAM (XANAX) 0.25 MG TABLET    TAKE (1/2 - 1) TABLET BY MOUTH THREE TIMES A DAY AS NEEDED FOR NERVES   ASCORBIC ACID (VITAMIN C) 1000 MG TABLET    Take 1,000 mg by mouth daily.   ASPIRIN EC 81 MG TABLET    Take 81 mg by mouth daily.   DIAZEPAM (VALIUM) 2 MG TABLET    Take 1/2 to 1 tablet by mouth at bedtime as needed for rest or anxiety   DULOXETINE (CYMBALTA) 60 MG CAPSULE    One daily to help depression and pain   FERROUS SULFATE 325 (65 FE) MG TABLET    Take 1 tablet (325 mg total) by mouth  daily with breakfast.   FUROSEMIDE (LASIX) 20 MG TABLET    Take 1 tablet (20 mg total) by mouth daily.   HYDROCORTISONE (PROCTOSOL HC) 2.5 % RECTAL CREAM    Place 1 application rectally 2 (two) times daily as needed for hemorrhoids or itching. Patient denies any serious reaction to steroids.   HYDROCORTISONE CREAM 1 %    Apply twice daily to rash   IBUPROFEN (ADVIL) 200 MG TABLET    Take 200 mg by mouth every 6 (six) hours as needed for pain.   LEVOTHYROXINE (SYNTHROID, LEVOTHROID) 75 MCG TABLET    TAKE 1 TABLET BY MOUTH EVERY DAY   LOSARTAN (COZAAR) 100 MG TABLET    Take 1 tablet (100 mg total) by mouth daily.   METOPROLOL SUCCINATE (TOPROL-XL) 50 MG 24 HR TABLET     Take one tablet by mouth once daily with a meal to control blood pressure   MIRTAZAPINE (REMERON) 7.5 MG TABLET    TAKE ONE TABLET BY MOUTH ONCE DAILY AT BEDTIME FOR MOOD   MULTIPLE VITAMIN (MULITIVITAMIN WITH MINERALS) TABS    Take 1 tablet by mouth daily.   OMEPRAZOLE (PRILOSEC) 20 MG CAPSULE    Take one tablet by mouth twice daily before a meal for stomach   OXYCODONE (OXY IR/ROXICODONE) 5 MG IMMEDIATE RELEASE TABLET    Take 5 mg by mouth every 6 (six) hours.   POTASSIUM CHLORIDE (KLOR-CON 10) 10 MEQ TABLET    Take one tablet by mouth once daily for potassium  Modified Medications   No medications on file  Discontinued Medications   No medications on file     Review of Systems  Constitutional: Positive for activity change and fatigue. Negative for fever, chills, appetite change and unexpected weight change.  HENT: Positive for hearing loss and tinnitus. Negative for congestion and ear pain.   Eyes: Negative.   Respiratory: Positive for shortness of breath (occasionally).        Says thre dyspnea is worse in the mornings.  Cardiovascular: Positive for leg swelling.  Gastrointestinal: Negative for diarrhea, constipation and abdominal distention.       Has metal stent in biliary duct due to multiple stones.  Genitourinary:       Urinary leakage.  Musculoskeletal: Positive for back pain and gait problem. Negative for neck pain and neck stiffness.       Chronic, debilitating back pains which severely affects her mobility. There is generalized muscle weakness particularly below the waist. She has joint pains in the hips and knees. There is some restriction of motion at the knees and perhaps the hips as well. She is status post fracture left femur at the hip on 01/26/13. Residual pain is still present. She is very unsteady on standing.  Skin: Positive for rash.       Scarring from prior left heel ucer. Cellulitis left foot has improved.  Neurological: Positive for weakness. Negative for  tremors, seizures, syncope and speech difficulty.       Difficulty with balance. Unable to walk.  Hematological: Negative.   Psychiatric/Behavioral: Positive for hallucinations. Negative for sleep disturbance. The patient is nervous/anxious.     Filed Vitals:   07/05/15 1440  BP: 130/86  Pulse: 79  Temp: 98.3 F (36.8 C)  TempSrc: Oral  Resp: 18  SpO2: 96%   There is no weight on file to calculate BMI.  Physical Exam  Constitutional: She is oriented to person, place, and time. She appears distressed.  Frail. Elderly. Thin  body habitus.  HENT:  Head: Normocephalic and atraumatic.  Right Ear: External ear normal.  Left Ear: External ear normal.  Nose: Nose normal.  Mouth/Throat: Oropharynx is clear and moist.  Significant loss of hearing bilaterally. Wears hearing aids bilaterally.  Eyes: EOM are normal. Pupils are equal, round, and reactive to light.  Wears corrective lenses.  Neck: Neck supple. No JVD present. No tracheal deviation present. No thyromegaly present.  Cardiovascular: Normal rate and normal heart sounds.  An irregular rhythm present. Exam reveals no gallop and no friction rub.   No murmur heard. Diminished DP and PT  Pulmonary/Chest: No respiratory distress. She has no wheezes. She has rales (bilateral dry ). She exhibits no tenderness.  Dry rales bilaterally throughout the lung fields.  Abdominal: Soft. She exhibits no distension and no mass. There is no tenderness.  Musculoskeletal: She exhibits edema (2+ bipedal) and tenderness.  Scoliosis and kyphosis. Restricted movement due to poor balance and continued pain in the right hip and back. Wheelchair bound. She can stand with significant assistance from others. She no longer walks.   Lymphadenopathy:    She has no cervical adenopathy.  Neurological: She is alert and oriented to person, place, and time. No cranial nerve deficit. Coordination normal.  Skin: No rash noted. No erythema. No pallor.  Pressure  ulcer of the heel has healed, but there is residual discomfort. Blush area in the RLQ of abd. Scaly area on the right buttock. Erythematous flare around the seborrheic keratosis of the right neck.  Psychiatric: She has a normal mood and affect. Her behavior is normal. Judgment and thought content normal.  Mildly depressed affect.     Labs reviewed: Appointment on 05/12/2015  Component Date Value Ref Range Status  . Glucose 05/12/2015 102* 65 - 99 mg/dL Final  . BUN 05/12/2015 17  10 - 36 mg/dL Final  . Creatinine, Ser 05/12/2015 0.78  0.57 - 1.00 mg/dL Final  . GFR calc non Af Amer 05/12/2015 64  >59 mL/min/1.73 Final  . GFR calc Af Amer 05/12/2015 74  >59 mL/min/1.73 Final  . BUN/Creatinine Ratio 05/12/2015 22  11 - 26 Final  . Sodium 05/12/2015 139  134 - 144 mmol/L Final  . Potassium 05/12/2015 4.1  3.5 - 5.2 mmol/L Final  . Chloride 05/12/2015 98  97 - 108 mmol/L Final  . CO2 05/12/2015 25  18 - 29 mmol/L Final  . Calcium 05/12/2015 9.7  8.7 - 10.3 mg/dL Final  . Total Protein 05/12/2015 6.5  6.0 - 8.5 g/dL Final  . Albumin 05/12/2015 4.0  3.2 - 4.6 g/dL Final  . Globulin, Total 05/12/2015 2.5  1.5 - 4.5 g/dL Final  . Albumin/Globulin Ratio 05/12/2015 1.6  1.1 - 2.5 Final  . Bilirubin Total 05/12/2015 0.4  0.0 - 1.2 mg/dL Final  . Alkaline Phosphatase 05/12/2015 133* 39 - 117 IU/L Final  . AST 05/12/2015 22  0 - 40 IU/L Final  . ALT 05/12/2015 14  0 - 32 IU/L Final  . TSH 05/12/2015 1.130  0.450 - 4.500 uIU/mL Final     Assessment/Plan 1. Depression Continue Cymbalta  2. Abnormality of gait Assistance with all transfers DME Hospital bed  3. Insomnia Resolved  4. Edema Stable  5. Hypothyroidism, unspecified hypothyroidism type Controlled, continue Synthroid 75 mcg  6. Chronic pain Taper Oxycodone, ok to half tab tid for a week or so, then decrease by 1/2 tab each week. May add Tylenol 325 mg 3 times daily  7. Chronic atrial  fibrillation Controlled, ASA for  risk reduction  8. Essential hypertension Controlled  9. Weak - Ambulatory referral to [facility] bed  10. Dementia with behavioral disturbance - DME Hospital bed

## 2015-07-05 NOTE — Patient Instructions (Addendum)
Reduce oxycodone to 1/2 tablet 3 times daily. If pain can be managed at this dose, reduce further to twice daily after a week. Continue dose reductions weekly if possible. You may add Tylenol one tablet 3 times daily if needed.

## 2015-08-05 ENCOUNTER — Telehealth: Payer: Self-pay | Admitting: *Deleted

## 2015-08-05 NOTE — Telephone Encounter (Signed)
Jane with Hospice called and stated that son is requesting a DNR for patient to keep at home and nurse wants to know if the Hospice Dr. Lilian Coma sign one to give to patient. I advised that was ok as long as patient and family was in agreement.

## 2015-09-01 DEATH — deceased

## 2015-09-19 ENCOUNTER — Other Ambulatory Visit: Payer: Medicare Other

## 2015-09-21 ENCOUNTER — Ambulatory Visit: Payer: Medicare Other | Admitting: Internal Medicine

## 2016-08-23 IMAGING — CT CT ABD-PELV W/ CM
2 of 5 series · 14 of 36 positions shown, 17 images · IV contrast (Omni 300)
Comparison: None.

CLINICAL DATA: Right upper quadrant pain, fever

EXAM:
CT CHEST, ABDOMEN AND PELVIS WITHOUT CONTRAST
TECHNIQUE: Multidetector CT imaging of the chest, abdomen and pelvis was
performed following the standard protocol without IV contrast.

[Series 2: cap 5.0 i31f 1 · axial · 0.75mm/px · z∈[+924,+1409]mm · 11 of 111 slices shown, 14 images]
[im 7/111  mediastinal]
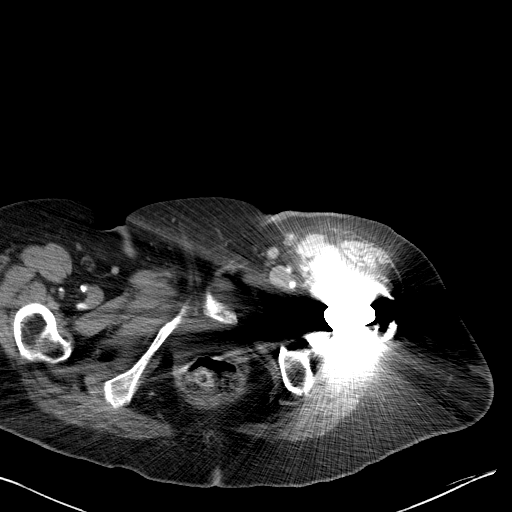
[im 7/111  lung]
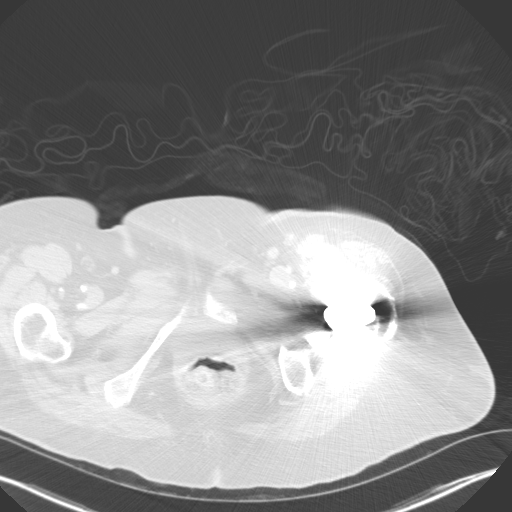
[im 21/111  lung]
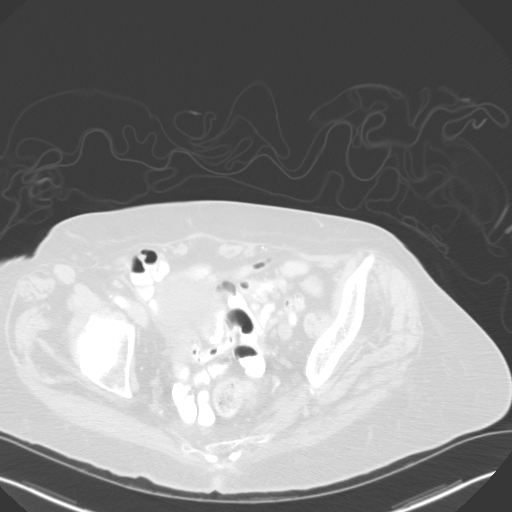
[im 28/111  lung]
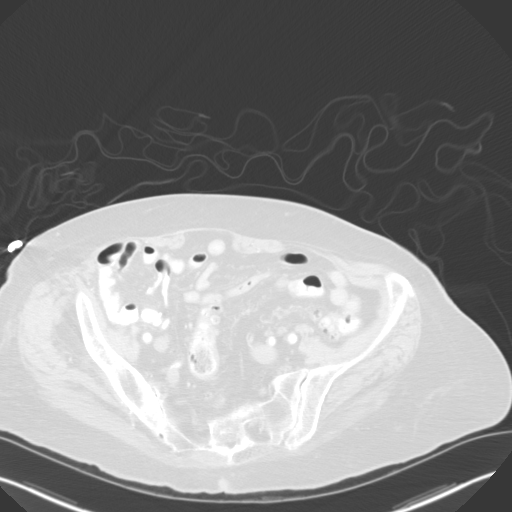
[im 35/111  lung]
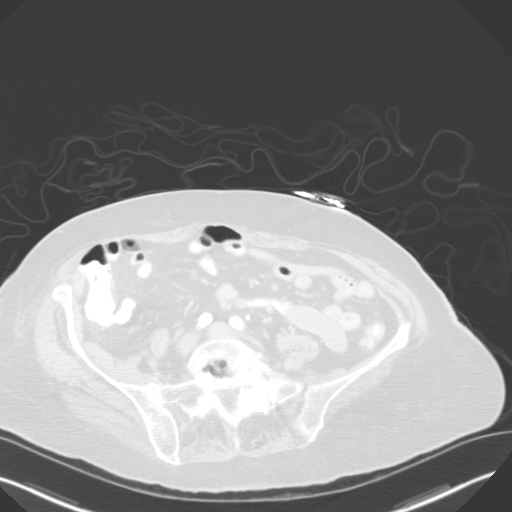
[im 49/111  mediastinal]
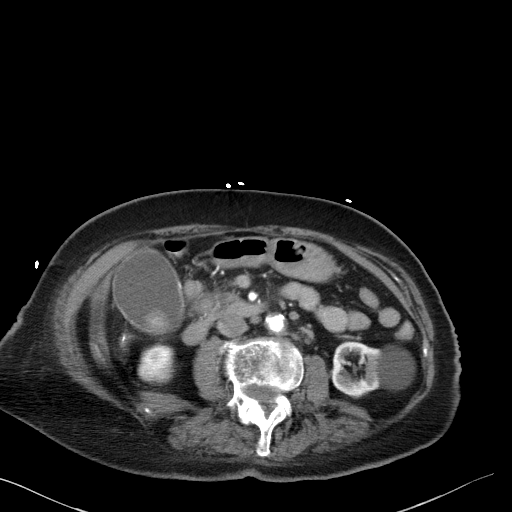
[im 49/111  lung]
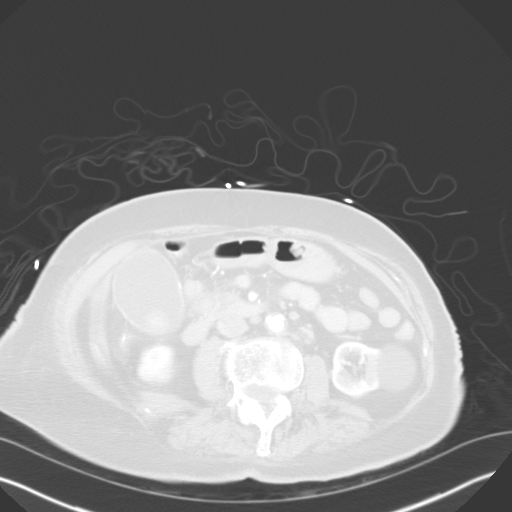
[im 56/111  lung]
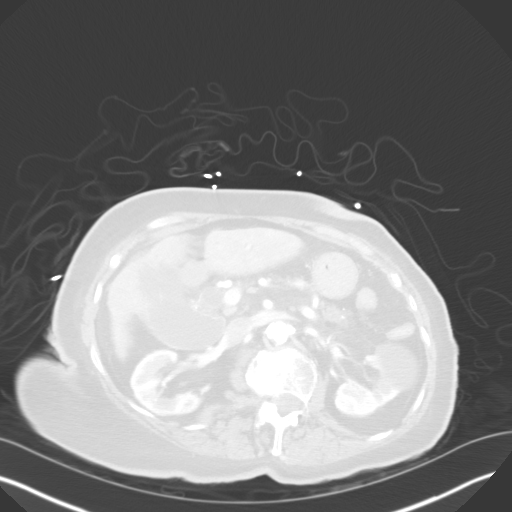
[im 62/111  lung]
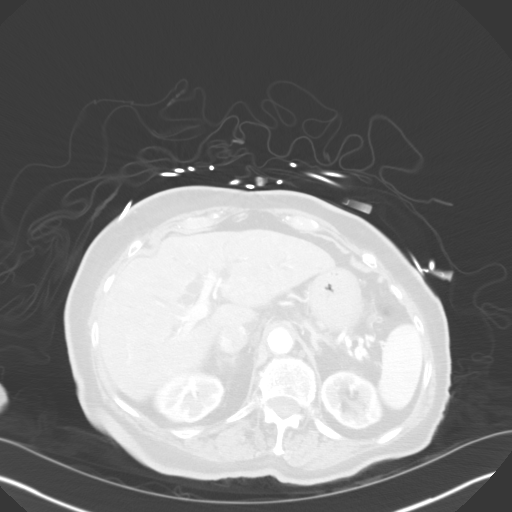
[im 76/111  lung]
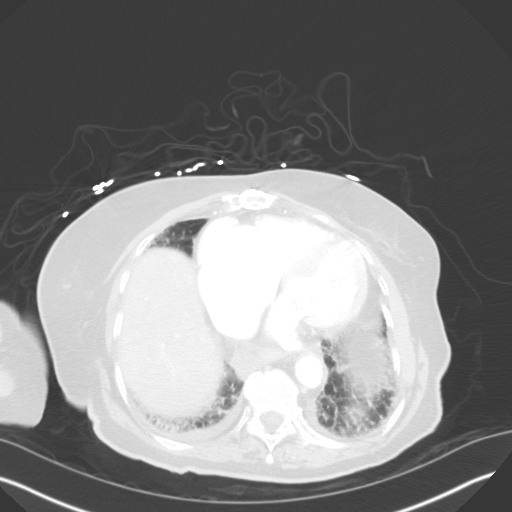
[im 83/111  mediastinal]
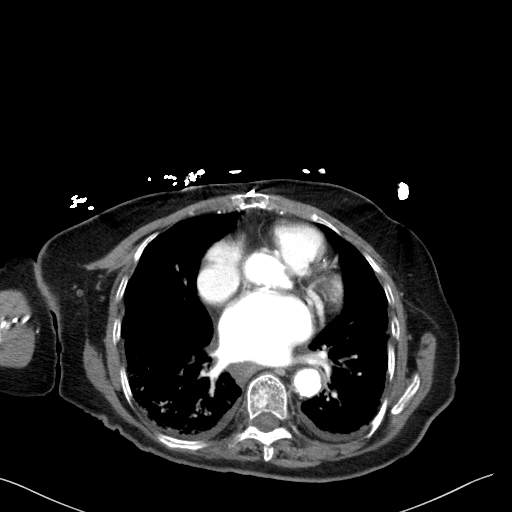
[im 83/111  lung]
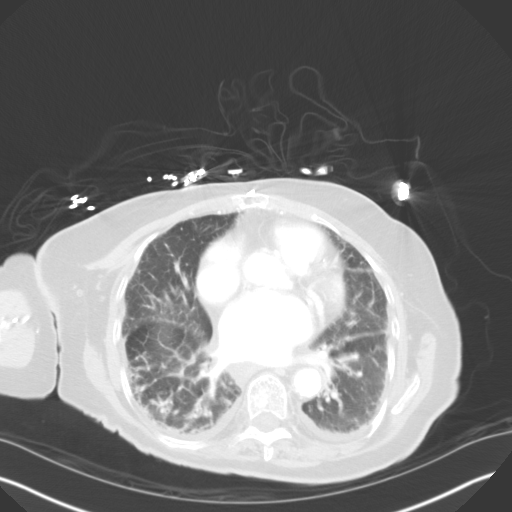
[im 90/111  lung]
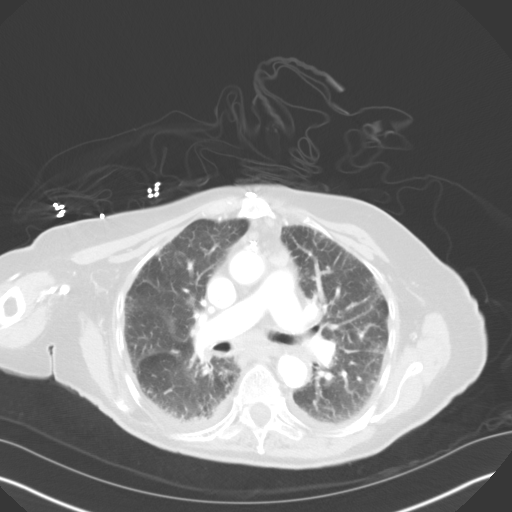
[im 104/111  lung]
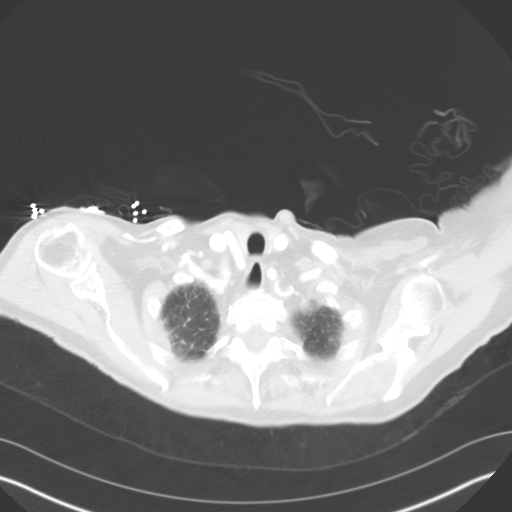

[Series 5: coronal · coronal · 0.66mm/px · 3 of 71 slices shown]
[im 15/71  lung]
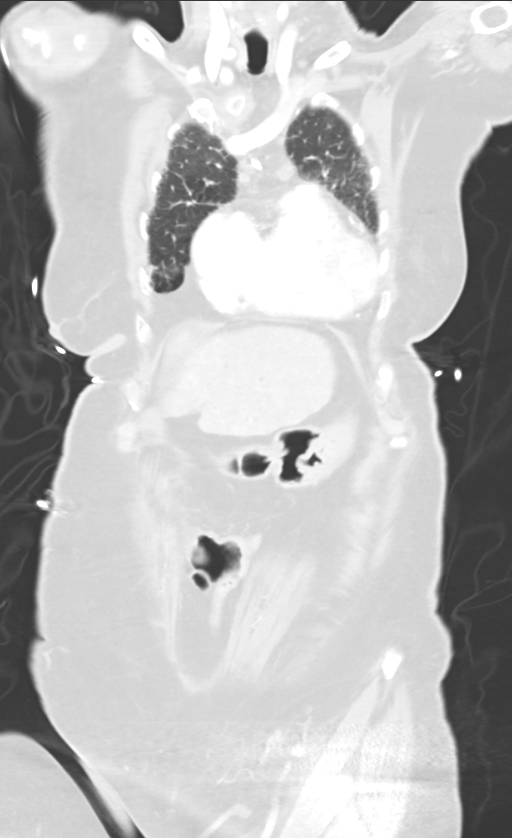
[im 29/71  lung]
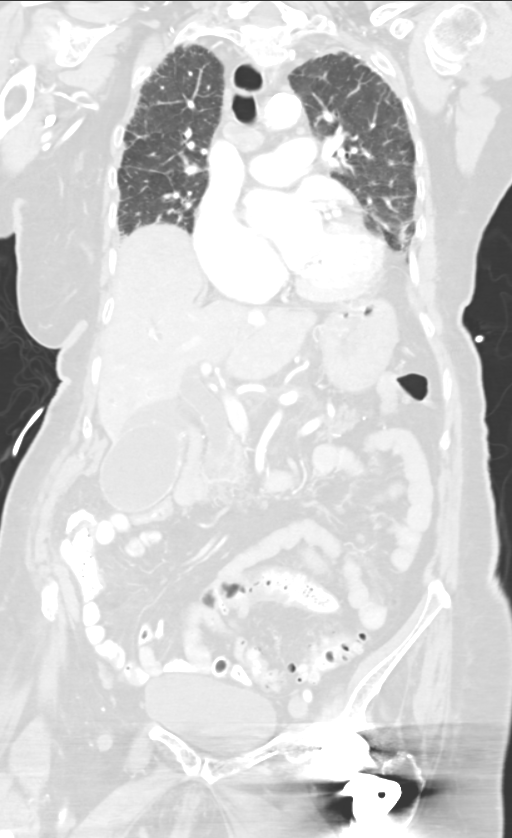
[im 43/71  lung]
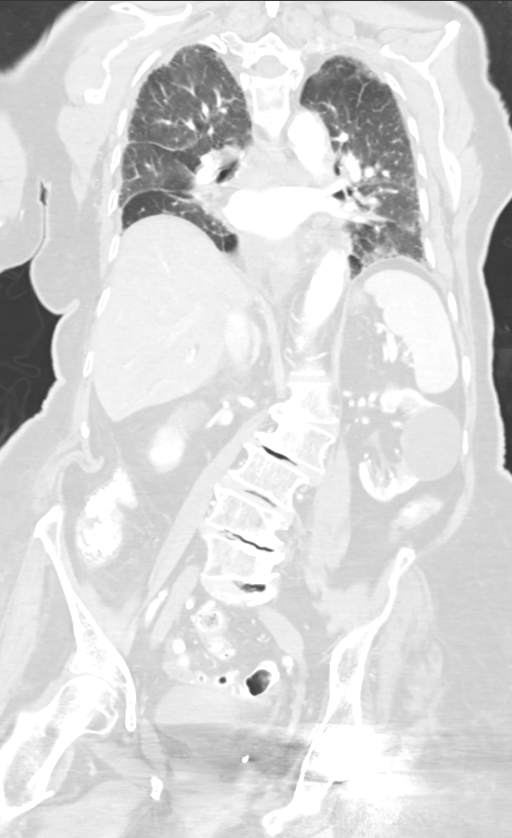

[14 of 36 positions shown; findings below may reference images not displayed]

FINDINGS: CT CHEST FINDINGS

Increased interstitial lung markings diffusely and bilaterally
suggesting chronic lung disease. Mild right lower lobe atelectasis.

Negative for pneumonia. Small bilateral pleural effusions. Negative
for mass. 15 mm precarinal node. 12 mm AP window node. Additional
smaller mediastinal nodes. Sub carinal 15 mm node.

Cardiac enlargement without pericardial effusion. Coronary artery
calcification. Extensive atherosclerotic calcification in the aortic
arch and great vessels.

CT ABDOMEN AND PELVIS FINDINGS

Gallbladder is markedly distended with mild gallbladder wall
thickening. Large 25 mm partially calcified gallstone. Biliary duct
dilatation. Intrahepatic and extrahepatic bile ducts are dilated.
Common bile duct measures 13 mm. Multiple rounded filling defects
are present in the common bile duct consistent with common duct
stones causing obstruction.

Pancreatic atrophy without mass lesion. No focal liver lesion.
Spleen is normal.

4 cm left renal cyst.  No renal obstruction or stone.

Negative for bowel obstruction. Sigmoid diverticulosis. No mass in
the abdomen. No free fluid.

Lumbar scoliosis with multilevel spondylosis.  No fracture.
IMPRESSION: Increased lung markings diffusely suggesting chronic lung disease.
Mediastinal adenopathy is noted without peripheral lung mass. Small
bilateral pleural effusions.

Intrahepatic and extrahepatic biliary ductal dilatation with
multiple common duct stones. Gallbladder is dilated with a 25 mm
calcified stone and mild gallbladder wall thickening and hyper
enhancement.

## 2016-08-24 IMAGING — RF DG ERCP WO/W SPHINCTEROTOMY
1 series · 4 of 4 positions shown · non-contrast
Comparison: CT the abdomen pelvis - 09/16/2014

CLINICAL DATA: Multiple bile duct stones.

EXAM:
ERCP

[Series 1: run · 4 of 4 slices shown]
[im 1/4]
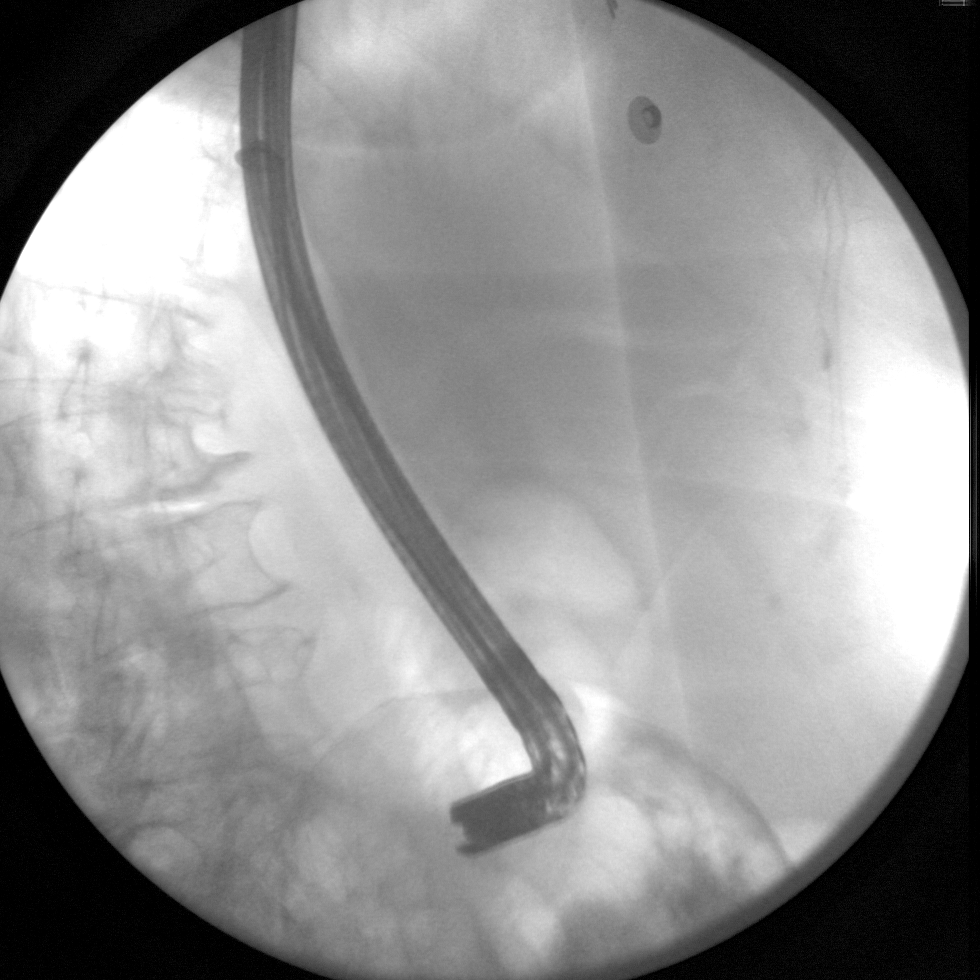
[im 2/4]
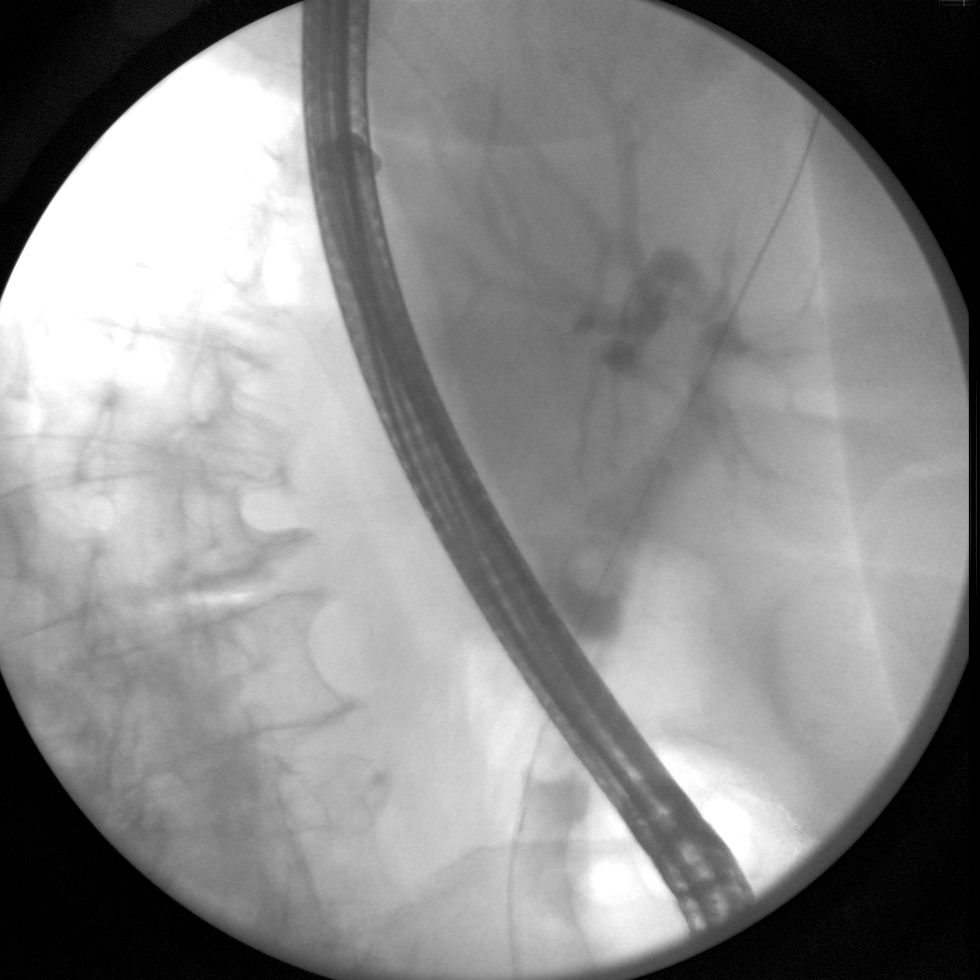
[im 3/4]
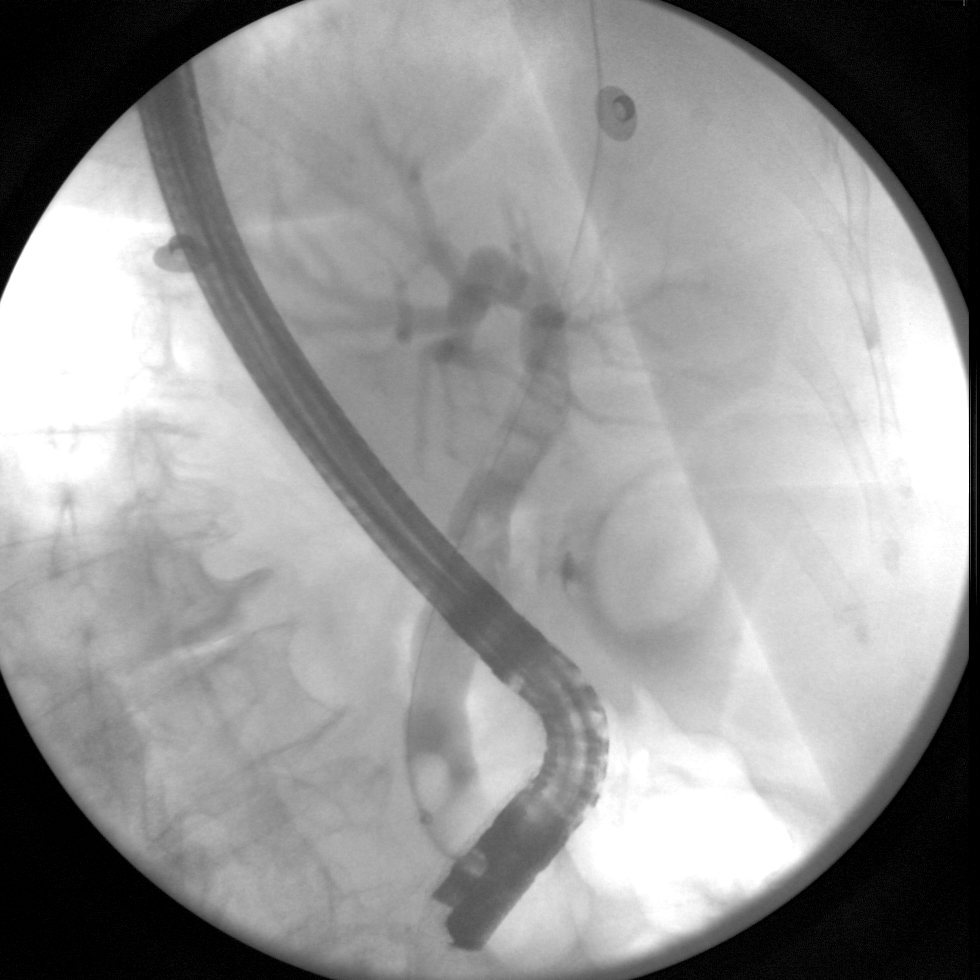
[im 4/4]
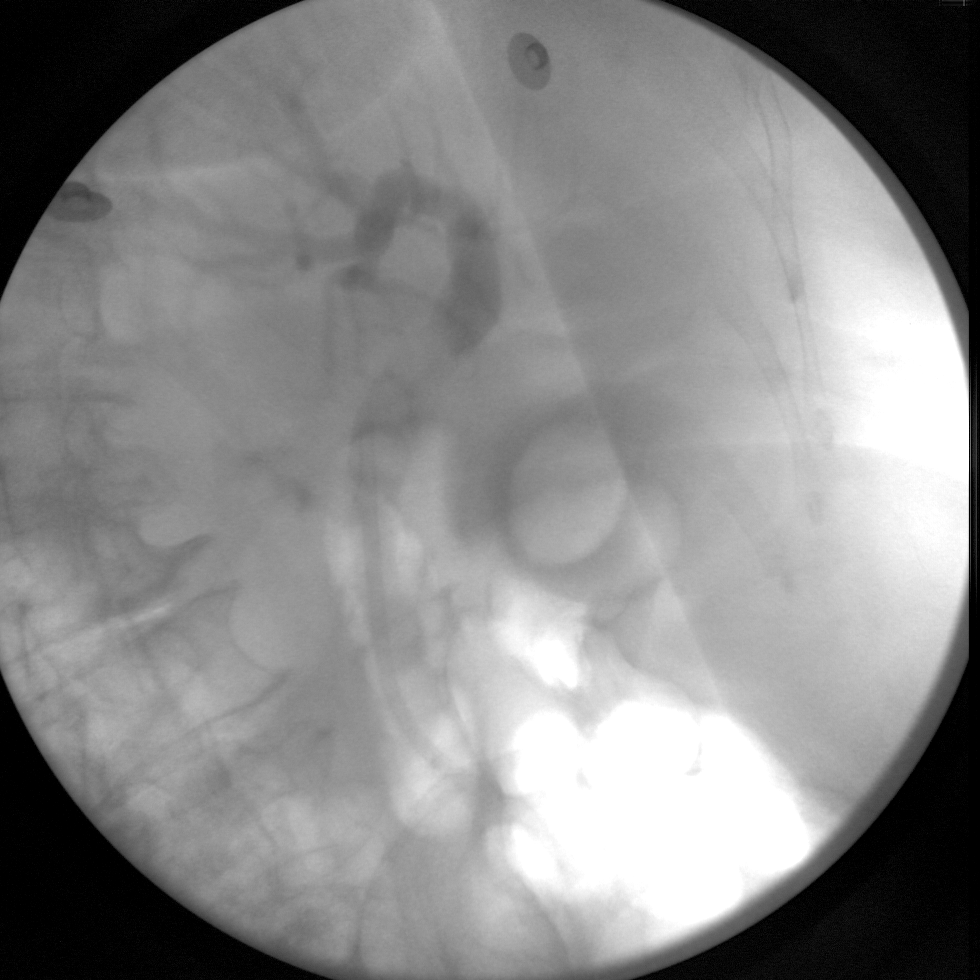

[4 of 4 positions shown; findings below may reference images not displayed]

FINDINGS: Four spot intraoperative fluoroscopic images of the right upper
abdominal quadrant during ERCP are provided for review.

Initial image demonstrates an ERCP probe overlying the right upper
abdominal quadrant.

Subsequent images demonstrate selective cannulation and
opacification of the CBD which appears dilated.

There are several persistent filling defects within the central and
distal aspects of the CBD compatible with the choledocholithiasis
demonstrated on prior abdominal CT. Additionally, there is minimal
opacification of the gallbladder with a large intraluminal filling
defect compatible with the known large gallstone.

Subsequent images demonstrate insufflation of a sphincterotomy
balloon within the distal aspect of the CBD.

Completion image demonstrates a biliary stent overlying distal
aspect of the CBD. Several nonocclusive residual filling defects are
seen within the central aspect of the proper hepatic duct.

There is minimal opacification of the central aspect of the
intrahepatic biliary tree which appears mildly dilated.
IMPRESSION: ERCP with stone retraction and stent placement with residual
choledocholithiasis and cholelithiasis as detailed above.
